# Patient Record
Sex: Female | Born: 1940 | Race: Black or African American | Hispanic: No | Marital: Single | Smoking: Never smoker
Health system: Southern US, Community
[De-identification: ages and names within clinical notes are randomized; demographics above are authoritative.]

## PROBLEM LIST (undated history)

## (undated) DIAGNOSIS — H401130 Primary open-angle glaucoma, bilateral, stage unspecified: Secondary | ICD-10-CM

## (undated) DIAGNOSIS — J45909 Unspecified asthma, uncomplicated: Secondary | ICD-10-CM

## (undated) DIAGNOSIS — I110 Hypertensive heart disease with heart failure: Secondary | ICD-10-CM

## (undated) DIAGNOSIS — I872 Venous insufficiency (chronic) (peripheral): Secondary | ICD-10-CM

## (undated) DIAGNOSIS — E119 Type 2 diabetes mellitus without complications: Secondary | ICD-10-CM

## (undated) DIAGNOSIS — K5792 Diverticulitis of intestine, part unspecified, without perforation or abscess without bleeding: Secondary | ICD-10-CM

## (undated) DIAGNOSIS — M25511 Pain in right shoulder: Secondary | ICD-10-CM

## (undated) DIAGNOSIS — J309 Allergic rhinitis, unspecified: Secondary | ICD-10-CM

## (undated) DIAGNOSIS — E079 Disorder of thyroid, unspecified: Secondary | ICD-10-CM

## (undated) DIAGNOSIS — E559 Vitamin D deficiency, unspecified: Secondary | ICD-10-CM

## (undated) HISTORY — PX: OTHER SURGICAL HISTORY: SHX169

---

## 2007-10-18 ENCOUNTER — Ambulatory Visit: Payer: Self-pay | Admitting: Family Medicine

## 2008-10-23 ENCOUNTER — Ambulatory Visit: Payer: Self-pay | Admitting: Family Medicine

## 2009-01-24 ENCOUNTER — Emergency Department: Payer: Self-pay | Admitting: Emergency Medicine

## 2009-01-26 ENCOUNTER — Inpatient Hospital Stay: Payer: Self-pay | Admitting: Internal Medicine

## 2009-01-30 ENCOUNTER — Emergency Department: Payer: Self-pay

## 2009-06-19 ENCOUNTER — Ambulatory Visit: Payer: Self-pay | Admitting: Gastroenterology

## 2009-07-25 ENCOUNTER — Emergency Department: Payer: Self-pay | Admitting: Emergency Medicine

## 2009-09-20 ENCOUNTER — Ambulatory Visit: Payer: Self-pay | Admitting: Gastroenterology

## 2009-09-20 ENCOUNTER — Emergency Department: Payer: Self-pay | Admitting: Emergency Medicine

## 2009-10-30 ENCOUNTER — Emergency Department: Payer: Self-pay | Admitting: Emergency Medicine

## 2010-01-04 ENCOUNTER — Emergency Department: Payer: Self-pay | Admitting: Emergency Medicine

## 2010-01-14 ENCOUNTER — Inpatient Hospital Stay: Payer: Self-pay | Admitting: Internal Medicine

## 2010-04-09 ENCOUNTER — Emergency Department: Payer: Self-pay | Admitting: Emergency Medicine

## 2011-01-28 ENCOUNTER — Ambulatory Visit: Payer: Self-pay | Admitting: Family Medicine

## 2011-02-06 ENCOUNTER — Ambulatory Visit: Payer: Self-pay

## 2012-03-11 ENCOUNTER — Ambulatory Visit: Payer: Self-pay | Admitting: Family

## 2014-06-12 ENCOUNTER — Ambulatory Visit: Payer: Self-pay | Admitting: Family

## 2015-06-21 ENCOUNTER — Other Ambulatory Visit
Admission: RE | Admit: 2015-06-21 | Discharge: 2015-06-21 | Disposition: A | Discharge status: Home / Self Care | Payer: Medicare (Managed Care) | Source: Ambulatory Visit | Attending: General Surgery | Admitting: General Surgery

## 2015-06-21 ENCOUNTER — Encounter: Payer: Medicare (Managed Care) | Attending: General Surgery | Admitting: General Surgery

## 2015-06-21 ENCOUNTER — Encounter: Payer: Self-pay | Admitting: General Surgery

## 2015-06-21 DIAGNOSIS — L97519 Non-pressure chronic ulcer of other part of right foot with unspecified severity: Secondary | ICD-10-CM | POA: Diagnosis not present

## 2015-06-21 DIAGNOSIS — L97509 Non-pressure chronic ulcer of other part of unspecified foot with unspecified severity: Secondary | ICD-10-CM | POA: Diagnosis not present

## 2015-06-21 DIAGNOSIS — E1140 Type 2 diabetes mellitus with diabetic neuropathy, unspecified: Secondary | ICD-10-CM | POA: Diagnosis not present

## 2015-06-21 DIAGNOSIS — E079 Disorder of thyroid, unspecified: Secondary | ICD-10-CM | POA: Diagnosis not present

## 2015-06-21 DIAGNOSIS — E11621 Type 2 diabetes mellitus with foot ulcer: Secondary | ICD-10-CM | POA: Diagnosis not present

## 2015-06-21 DIAGNOSIS — M1990 Unspecified osteoarthritis, unspecified site: Secondary | ICD-10-CM | POA: Diagnosis not present

## 2015-06-21 DIAGNOSIS — L089 Local infection of the skin and subcutaneous tissue, unspecified: Secondary | ICD-10-CM | POA: Insufficient documentation

## 2015-06-21 DIAGNOSIS — S91101A Unspecified open wound of right great toe without damage to nail, initial encounter: Secondary | ICD-10-CM | POA: Diagnosis present

## 2015-06-21 NOTE — Progress Notes (Signed)
seeiheal 

## 2015-06-25 LAB — WOUND CULTURE: Culture: NORMAL

## 2015-06-25 NOTE — Progress Notes (Addendum)
[name] [name], [name] [name] ([id number]) Visit Report for 06/21/2015 Chief Complaint Document Details Patient Name: [name] [name], [name] [name] 06/21/2015 10:30 Date of Service: AM Medical Record [id number] Number: Patient Account Number: [id number] 11-07-1940 (74 y.o. Treating RN: Huel Coventry Date of Birth/Sex: Female) Other Clinician: Primary Care Physician: Lenoard Aden, Shene Maxfield Referring Physician: Idelle Leech Physician/Extender: [name] in Treatment: 0 Information Obtained from: Patient Electronic Signature(s) Signed: 06/21/2015 5:11:41 PM By: Ardath Sax MD Entered By: Ardath Sax on 06/21/2015 12:34:27 [name] [name], [name] [name] ([id number]) -------------------------------------------------------------------------------- Debridement Details Patient Name: [name], [name] 06/21/2015 10:30 Date of Service: AM Medical Record [id number] Number: Patient Account Number: [id number] [birth year] (74 y.o. Treating RN: Huel Coventry Date of Birth/Sex: Female) Other Clinician: Primary Care Physician: Lenoard Aden, Cylis Ayars Referring Physician: Idelle Leech Physician/Extender: Weeks in Treatment: 0 Debridement Performed for Wound #1 Right,Lateral Toe Great Assessment: Performed By: Physician Ardath Sax, MD Debridement: Debridement Pre-procedure Yes Verification/Time Out Taken: Start Time: 11:32 Pain Control: Other : lidocaine 4% Level: Skin/Subcutaneous Tissue Total Area Debrided (L x 4 (cm) x 3 (cm) = 12 (cm) W): Tissue and other Eschar, Exudate, Fibrin/Slough, Skin, Subcutaneous material debrided: Instrument: Forceps, Scissors Bleeding: Minimum Hemostasis Achieved: Pressure End Time: 11:40 Procedural Pain: 0 Post Procedural Pain: 0 Response to Treatment: Procedure was tolerated well Post Debridement Measurements of Total Wound Length: (cm) 4 Width: (cm) 3 Depth: (cm) 0.3 Volume: (cm) 2.827 Post Procedure Diagnosis Same as Pre-procedure Electronic  Signature(s) Signed: 06/25/2015 9:25:31 AM By: Elliot Gurney, RN, BSN, Kim RN, BSN Entered By: Elliot Gurney, RN, BSN, Kim on 06/21/2015 11:47:53 [name], Female ([id number]) -------------------------------------------------------------------------------- HPI Details Patient Name: [name], [name] 06/21/2015 10:30 Date of Service: AM Medical Record [id number] Number: Patient Account Number: [id number] 08-03-1941 (74 y.o. Treating RN: Huel Coventry Date of Birth/Sex: Female) Other Clinician: Primary Care Physician: Lenoard Aden, Theressa Piedra Referring Physician: Idelle Leech Physician/Extender: [name] in Treatment: 0 Electronic Signature(s) Signed: 06/21/2015 5:11:41 PM By: Ardath Sax MD Entered By: Ardath Sax on 06/21/2015 12:34:37 [name] [name], [name] [name] ([id number]) -------------------------------------------------------------------------------- Physical Exam Details Patient Name: [name], [name] 06/21/2015 10:30 Date of Service: AM Medical Record [id number] Number: Patient Account Number: [id number] 1940-12-15 (74 y.o. Treating RN: Huel Coventry Date of Birth/Sex: Female) Other Clinician: Primary Care Physician: Lenoard Aden, Emilia Kayes Referring Physician: Idelle Leech Physician/Extender: [name] in Treatment: 0 Electronic Signature(s) Signed: 06/21/2015 5:11:41 PM By: Ardath Sax MD Entered By: Ardath Sax on 06/21/2015 12:34:47 [name] [name], [name] [name] ([id number]) -------------------------------------------------------------------------------- Physician Orders Details Patient Name: [name] [name], [name] [name] 06/21/2015 10:30 Date of Service: AM Medical Record [id number] Number: Patient Account Number: [id number] [birth year] (74 y.o. Treating RN: Huel Coventry Date of Birth/Sex: Female) Other Clinician: Primary Care Physician: Lenoard Aden, Tish Begin Referring Physician: Idelle Leech Physician/Extender: [name] in Treatment: 0 Verbal / Phone Orders: Yes Clinician:  Huel Coventry Read Back and Verified: Yes Diagnosis Coding Wound Cleansing Wound #1 Right,Lateral Toe Great o Clean wound with Normal Saline. o No tub bath. Anesthetic Wound #1 Right,Lateral Toe Great o Topical Lidocaine 4% cream applied to wound bed prior to debridement Primary Wound Dressing Wound #1 Right,Lateral Toe Great o Aquacel Ag Secondary Dressing Wound #1 Right,Lateral Toe Great o Gauze and Kerlix/Conform Dressing Change Frequency Wound #1 Right,Lateral Toe Great o Change Dressing Monday, Wednesday, Friday Follow-up Appointments Wound #1 Right,Lateral Toe Great o Return Appointment in 1 week. Additional Orders / Instructions Wound #1 Right,Lateral Toe Great o Increase protein intake. o Activity as tolerated Medications-please add to medication list. Wound #1 Right,Lateral Toe Great o P.O. Antibiotics - continue antibiotics prescribed by outside md [name],  [name] ([id number]) Laboratory o Culture and Sensitivity - Right great toe oooo Electronic Signature(s) Signed: 06/26/2015 9:09:57 AM By: Ardath Sax MD Previous Signature: 06/25/2015 9:25:31 AM Version By: Elliot Gurney RN, BSN, Kim RN, BSN Entered By: Ardath Sax on 06/26/2015 09:09:56 [name], [name] ([id number]) -------------------------------------------------------------------------------- Problem List Details Patient Name: [name] [name], [name] [name] 06/21/2015 10:30 Date of Service: AM Medical Record [id number] Number: Patient Account Number: [id number] 08/30/1941 (74 y.o. Treating RN: Huel Coventry Date of Birth/Sex: Female) Other Clinician: Primary Care Physician: Lattie Corns Referring Physician: Idelle Leech Physician/Extender: Weeks in Treatment: 0 Active Problems ICD-10 Encounter Code Description Active Date Diagnosis E11.621 Type 2 diabetes mellitus with foot ulcer 06/21/2015 Yes Inactive Problems Resolved Problems Electronic Signature(s) Signed: 06/21/2015  5:11:41 PM By: Ardath Sax MD Entered By: Ardath Sax on 06/21/2015 15:09:25 [name], [name] ([id number]) -------------------------------------------------------------------------------- Progress Note Details Patient Name: [name] [name], [name] [name] 06/21/2015 10:30 Date of Service: AM Medical Record [id number] Number: Patient Account Number: [id number] June 01, 1941 (74 y.o. Treating RN: Huel Coventry Date of Birth/Sex: Female) Other Clinician: Primary Care Physician: Lenoard Aden, Nechemia Chiappetta Referring Physician: Idelle Leech Physician/Extender: [name] in Treatment: 0 Subjective Chief Complaint Information obtained from Patient Wound History Patient presents with 1 open wound that has been present for approximately 1 month. Laboratory tests have not been performed in the last month. Patient reportedly has tested positive for an antibiotic resistant organism. Patient reportedly has tested positive for osteomyelitis. Patient History Information obtained from Patient, Chart. Allergies simvastatin (Severity: Moderate), pravastatin (Severity: Moderate) Social History Never smoker, Marital Status - Widowed, Alcohol Use - Never, Drug Use - No History, Caffeine Use - Moderate. Medical History Eyes Patient has history of Cataracts, Glaucoma Cardiovascular Patient has history of Peripheral Venous Disease Endocrine Patient has history of Type II Diabetes Genitourinary Denies history of End Stage Renal Disease Immunological Denies history of Lupus Erythematosus, Raynaud s, Scleroderma Integumentary (Skin) Denies history of History of Burn, History of pressure wounds Musculoskeletal Patient has history of Osteoarthritis, Osteomyelitis Neurologic Patient has history of Neuropathy [name] [name], [name] [name] ([id number]) Oncologic Denies history of Received Chemotherapy, Received Radiation Psychiatric Denies history of Anorexia/bulimia, Confinement Anxiety Patient is treated with Oral Agents.  Blood sugar is tested. Review of Systems (ROS) Constitutional Symptoms (General Health) Complains or has symptoms of Fever - low grade. Eyes The patient has no complaints or symptoms. Ear/Nose/Mouth/Throat The patient has no complaints or symptoms. Hematologic/Lymphatic The patient has no complaints or symptoms. Respiratory The patient has no complaints or symptoms. Cardiovascular Complains or has symptoms of LE edema. Denies complaints or symptoms of Chest pain. Gastrointestinal The patient has no complaints or symptoms. Endocrine Complains or has symptoms of Thyroid disease. Denies complaints or symptoms of Hepatitis, Polydypsia (Excessive Thirst). Genitourinary Complains or has symptoms of Incontinence/dribbling. Denies complaints or symptoms of Kidney failure/ Dialysis. Immunological The patient has no complaints or symptoms. Integumentary (Skin) Complains or has symptoms of Wounds. Denies complaints or symptoms of Bleeding or bruising tendency, Breakdown, Swelling. Musculoskeletal The patient has no complaints or symptoms. Neurologic The patient has no complaints or symptoms. Oncologic The patient has no complaints or symptoms. Psychiatric The patient has no complaints or symptoms. General Notes: Patient is not best historian. A lot of information taken from [facility] Notes. Objective [name] [name], Joslynn ([id number]) Constitutional Vitals Time Taken: 10:04 AM, Height: 66 in, Weight: 195 lbs, BMI: 31.5, Temperature: 98.2 F, Pulse: 77 bpm, Respiratory Rate: 18 breaths/min, Blood Pressure: 127/45 mmHg. General Notes: Patient states she is feeling GOOD! Integumentary (Hair, Skin) Wound #1 status is  Open. Original cause of wound was Gradually Appeared. The wound is located on the [employer]. The wound measures 4cm length x 3cm width x 0.3cm depth; 9.425cm^2 area and 2.827cm^3 volume. The wound margin is indistinct and nonvisible. There is a large (67-100%)  amount of necrotic tissue within the wound bed including Adherent Slough. Assessment Procedures Wound #1 Wound #1 is a Diabetic Wound/Ulcer of the Lower Extremity located on the Right,Lateral Toe Great . There was a Skin/Subcutaneous Tissue Debridement ([id number]) debridement with total area of 12 sq cm performed by Ardath Sax, MD. with the following instrument(s): Forceps and Scissors including Exudate, Fibrin/Slough, Eschar, Skin, and Subcutaneous after achieving pain control using Other (lidocaine 4%). A time out was conducted prior to the start of the procedure. A Minimum amount of bleeding was controlled with Pressure. The procedure was tolerated well with a pain level of 0 throughout and a pain level of 0 following the procedure. Post Debridement Measurements: 4cm length x 3cm width x 0.3cm depth; 2.827cm^3 volume. Post procedure Diagnosis Wound #1: Same as Pre-Procedure Plan Wound Cleansing: Wound #1 Right,Lateral Toe Great: Clean wound with Normal Saline. No tub bath. [name] [name], [name] [name] ([id number]) Anesthetic: Wound #1 Right,Lateral Toe Great: Topical Lidocaine 4% cream applied to wound bed prior to debridement Primary Wound Dressing: Wound #1 Right,Lateral Toe Great: Aquacel Ag Secondary Dressing: Wound #1 Right,Lateral Toe Great: Gauze and Kerlix/Conform Dressing Change Frequency: Wound #1 Right,Lateral Toe Great: Change Dressing Monday, Wednesday, Friday Follow-up Appointments: Wound #1 Right,Lateral Toe Great: Return Appointment in 1 week. Additional Orders / Instructions: Wound #1 Right,Lateral Toe Great: Increase protein intake. Activity as tolerated Medications-please add to medication list.: Wound #1 Right,Lateral Toe Great: P.O. Antibiotics - continue antibiotics prescribed by outside md Laboratory ordered were: Culture and Sensitivity - Right great toe Follow-Up Appointments: A follow-up appointment should be scheduled. Medication Reconciliation  completed and provided to Patient/Care Provider. debrided necrotic skin from right great toe. Treat with silver alginate Electronic Signature(s) Signed: 06/21/2015 5:11:41 PM By: Ardath Sax MD Entered By: Ardath Sax on 06/21/2015 12:37:23 [name] [name], [name] [name] ([id number]) -------------------------------------------------------------------------------- ROS/PFSH Details Patient Name: [name] [name], [name] [name] 06/21/2015 10:30 Date of Service: AM Medical Record [id number] Number: Patient Account Number: [id number] [birth year] (74 y.o. Treating RN: Huel Coventry Date of Birth/Sex: Female) Other Clinician: Primary Care Physician: Lenoard Aden, Naturi Alarid Referring Physician: Idelle Leech Physician/Extender: Weeks in Treatment: 0 Information Obtained From Patient Chart Wound History Do you currently have one or more open woundso Yes How many open wounds do you currently haveo 1 Approximately how long have you had your woundso 1 month Has your wound(s) ever healed and then re-openedo No Have you had any lab work done in the past montho No Have you tested positive for osteomyelitis (bone infection)o Yes Constitutional Symptoms (General Health) Complaints and Symptoms: Positive for: Fever - low grade Cardiovascular Complaints and Symptoms: Positive for: LE edema Negative for: Chest pain Medical History: Positive for: Peripheral Venous Disease Endocrine Complaints and Symptoms: Positive for: Thyroid disease Negative for: Hepatitis; Polydypsia (Excessive Thirst) Medical History: Positive for: Type II Diabetes Time with diabetes: 20 years Treated with: Oral agents Blood sugar tested every day: Yes Tested : 3 times Genitourinary Complaints and Symptoms: Positive for: Incontinence/dribbling Hindley, Reigna ([id number]) Negative for: Kidney failure/ Dialysis Medical History: Negative for: End Stage Renal Disease Integumentary (Skin) Complaints and Symptoms: Positive for:  Wounds Negative for: Bleeding or bruising tendency; Breakdown; Swelling Medical History: Negative for: History of Burn; History of pressure wounds Eyes Complaints and Symptoms:  No Complaints or Symptoms Medical History: Positive for: Cataracts; Glaucoma Ear/Nose/Mouth/Throat Complaints and Symptoms: No Complaints or Symptoms Hematologic/Lymphatic Complaints and Symptoms: No Complaints or Symptoms Respiratory Complaints and Symptoms: No Complaints or Symptoms Gastrointestinal Complaints and Symptoms: No Complaints or Symptoms Immunological Complaints and Symptoms: No Complaints or Symptoms Medical History: Negative for: Lupus Erythematosus; Raynaudos; Scleroderma Musculoskeletal Ribaudo, Ivannah ([id number]) Complaints and Symptoms: No Complaints or Symptoms Medical History: Positive for: Osteoarthritis; Osteomyelitis Neurologic Complaints and Symptoms: No Complaints or Symptoms Medical History: Positive for: Neuropathy Oncologic Complaints and Symptoms: No Complaints or Symptoms Medical History: Negative for: Received Chemotherapy; Received Radiation Psychiatric Complaints and Symptoms: No Complaints or Symptoms Medical History: Negative for: Anorexia/bulimia; Confinement Anxiety HBO Extended History Items Eyes: Eyes: Cataracts Glaucoma Immunizations Tetanus Vaccine: Last tetanus shot: 11/14/2010 Family and Social History Never smoker; Marital Status - Widowed; Alcohol Use: Never; Drug Use: No History; Caffeine Use: Moderate Notes Patient is not best historian. A lot of information taken from [facility] Notes. Electronic Signature(s) Signed: 06/25/2015 9:25:31 AM By: Elliot Gurney, RN, BSN, Kim RN, BSN Entered By: Elliot Gurney, RN, BSN, Kim on 06/21/2015 11:25:21 MARDELLA, NUCKLES ([id number]) -------------------------------------------------------------------------------- SuperBill Details Patient Name: [name] Date of Service: 06/21/2015 Medical Record Number:  [id number] Patient Account Number: [id number] Date of Birth/Sex: 1941-02-11 (74 y.o. 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 with foot ulcer Facility Procedures CPT4 Code: 08657846 Description: 99213 - WOUND CARE VISIT-LEV 3 EST PT Modifier: Quantity: 1 CPT4 Code: [id number] Description: 11042 - DEB SUBQ TISSUE 20 SQ CM/< ICD-10 Description Diagnosis E11.621 Type 2 diabetes mellitus with foot ulcer Modifier: Quantity: 1 Physician Procedures CPT4 Code: [id number] Description: 99213 - WC PHYS LEVEL 3 - EST PT ICD-10 Description Diagnosis E11.621 Type 2 diabetes mellitus with foot ulcer Modifier: Quantity: 1 CPT4 Code: [id number] Description: 11042 - WC PHYS SUBQ TISS 20 SQ CM ICD-10 Description Diagnosis E11.621 Type 2 diabetes mellitus with foot ulcer Modifier: Quantity: 1 Electronic Signature(s) Signed: 06/21/2015 5:11:41 PM By: Ardath Sax MD Entered By: Ardath Sax on 06/21/2015 15:10:16

## 2015-06-25 NOTE — Progress Notes (Signed)
[name] [name], [name] [name] ([id number]) Visit Report for 06/21/2015 Allergy List Details Patient Name: [name] [name], [name] [name] Date of Service: 06/21/2015 10:30 AM Medical Record Number: [id number] Patient Account Number: [id number] Date of Birth/Sex: Nov 26, 1940 (74 y.o. Female) Treating RN: Huel Coventry Primary Care Physician: Idelle Leech Other Clinician: Referring Physician: Idelle Leech Treating Physician/Extender: Ardath Sax Weeks in Treatment: 0 Allergies Active Allergies simvastatin Severity: Moderate pravastatin Severity: Moderate Allergy Notes Electronic Signature(s) Signed: 06/25/2015 9:25:31 AM By: Elliot Gurney, RN, BSN, Kim RN, BSN Entered By: Elliot Gurney, RN, BSN, Kim on 06/21/2015 10:35:05 [name] [name] ([id number]) -------------------------------------------------------------------------------- Arrival Information Details Patient Name: [name] [name] Date of Service: 06/21/2015 10:30 AM Medical Record Number: [id number] Patient Account Number: [id number] Date of Birth/Sex: 1941-07-24 (74 y.o. Female) Treating RN: Huel Coventry Primary Care Physician: Idelle Leech Other Clinician: Referring Physician: Idelle Leech Treating Physician/Extender: Elayne Snare in Treatment: 0 Visit Information Patient Arrived: Cloyde Reams Time: 11:03 Accompanied By: self Transfer Assistance: None Patient Identification Verified: Yes Secondary Verification Process Yes Completed: Patient Has Alerts: Yes Patient Alerts: Patient on Blood Thinner Type II Diabetic 81mg  Aspirin Electronic Signature(s) Signed: 06/21/2015 5:11:41 PM By: Ardath Sax MD Entered By: Ardath Sax on 06/21/2015 12:33:09 [name] [name] ([id number]) -------------------------------------------------------------------------------- Clinic Level of Care Assessment Details Patient Name: [name] [name] Date of Service: 06/21/2015 10:30 AM Medical Record Number: [id number] Patient Account Number: [id number] Date of Birth/Sex:  Mar 21, 1941 (74 y.o. Female) Treating RN: Huel Coventry Primary Care Physician: Idelle Leech Other Clinician: Referring Physician: Idelle Leech Treating Physician/Extender: Elayne Snare in Treatment: 0 Clinic Level of Care Assessment Items TOOL 1 Quantity Score []  - Use when EandM and Procedure is performed on INITIAL visit 0 ASSESSMENTS - Nursing Assessment / Reassessment X - General Physical Exam (combine w/ comprehensive assessment (listed just 1 20 below) when performed on new pt. evals) X - Comprehensive Assessment (HX, ROS, Risk Assessments, Wounds Hx, etc.) 1 25 ASSESSMENTS - Wound and Skin Assessment / Reassessment []  - Dermatologic / Skin Assessment (not related to wound area) 0 ASSESSMENTS - Ostomy and/or Continence Assessment and Care []  - Incontinence Assessment and Management 0 []  - Ostomy Care Assessment and Management (repouching, etc.) 0 PROCESS - Coordination of Care X - Simple Patient / Family Education for ongoing care 1 15 []  - Complex (extensive) Patient / Family Education for ongoing care 0 X - Staff obtains Chiropractor, Records, Test Results / Process Orders 1 10 []  - Staff telephones HHA, Nursing Homes / Clarify orders / etc 0 []  - Routine Transfer to another Facility (non-emergent condition) 0 []  - Routine Hospital Admission (non-emergent condition) 0 X - New Admissions / Manufacturing engineer / Ordering NPWT, Apligraf, etc. 1 15 []  - Emergency Hospital Admission (emergent condition) 0 PROCESS - Special Needs []  - Pediatric / Minor Patient Management 0 []  - Isolation Patient Management 0 [name] [name], [name] [name] ([id number]) []  - Hearing / Language / Visual special needs 0 []  - Assessment of Community assistance (transportation, D/C planning, etc.) 0 []  - Additional assistance / Altered mentation 0 []  - Support Surface(s) Assessment (bed, cushion, seat, etc.) 0 INTERVENTIONS - Miscellaneous []  - External ear exam 0 []  - Patient Transfer (multiple staff /  Nurse, adult / Similar devices) 0 []  - Simple Staple / Suture removal (25 or less) 0 []  - Complex Staple / Suture removal (26 or more) 0 []  - Hypo/Hyperglycemic Management (do not check if billed separately) 0 X - Ankle / Brachial Index (ABI) - do not check if billed separately 1 15 Has the patient been seen at  the hospital within the last three years: Yes Total Score: 100 Level Of Care: New/Established - Level 3 Electronic Signature(s) Signed: 06/25/2015 9:25:31 AM By: Elliot Gurney, RN, BSN, Kim RN, BSN Entered By: Elliot Gurney, RN, BSN, Kim on 06/21/2015 11:51:29 [name] [name], [name] [name] ([id number]) -------------------------------------------------------------------------------- Encounter Discharge Information Details Patient Name: [name] [name] Date of Service: 06/21/2015 10:30 AM Medical Record Number: [id number] Patient Account Number: [id number] Date of Birth/Sex: [birth year] (74 y.o. Female) Treating RN: Huel Coventry Primary Care Physician: Idelle Leech Other Clinician: Referring Physician: Idelle Leech Treating Physician/Extender: Elayne Snare in Treatment: 0 Encounter Discharge Information Items Schedule Follow-up Appointment: No Medication Reconciliation completed No and provided to Patient/Care Rolan Wrightsman: Provided on Clinical Summary of Care: 06/21/2015 Form Type Recipient Paper Patient ML Electronic Signature(s) Signed: 06/21/2015 5:11:41 PM By: Ardath Sax MD Previous Signature: 06/21/2015 11:52:39 AM Version By: Gwenlyn Perking Entered By: Ardath Sax on 06/21/2015 15:10:55 [name] [name], [name] [name] ([id number]) -------------------------------------------------------------------------------- Lower Extremity Assessment Details Patient Name: [name] [name] Date of Service: 06/21/2015 10:30 AM Medical Record Number: [id number] Patient Account Number: [id number] Date of Birth/Sex: 1940/11/11 (74 y.o. Female) Treating RN: Huel Coventry Primary Care Physician: Idelle Leech Other  Clinician: Referring Physician: Idelle Leech Treating Physician/Extender: Elayne Snare in Treatment: 0 Vascular Assessment Pulses: Posterior Tibial Palpable: [Right:No] Doppler: [Right:Monophasic] Dorsalis Pedis Palpable: [Right:Yes] Doppler: [Right:Monophasic] Extremity colors, hair growth, and conditions: Extremity Color: [Right:Hyperpigmented] Hair Growth on Extremity: [Right:No] Temperature of Extremity: [Right:Warm] Capillary Refill: [Right:> 3 seconds] Blood Pressure: Brachial: [Right:126] Dorsalis Pedis: [Left:Dorsalis Pedis: 102] Ankle: Posterior Tibial: [Left:Posterior Tibial:] [Right:0.81] Toe Nail Assessment Left: Right: Thick: No Discolored: Yes Deformed: No Improper Length and Hygiene: No Electronic Signature(s) Signed: 06/25/2015 9:25:31 AM By: Elliot Gurney, RN, BSN, Kim RN, BSN Entered By: Elliot Gurney, RN, BSN, Kim on 06/21/2015 11:23:07 [name] [name] ([id number]) -------------------------------------------------------------------------------- Multi Wound Chart Details Patient Name: [name] [name] Date of Service: 06/21/2015 10:30 AM Medical Record Number: [id number] Patient Account Number: [id number] Date of Birth/Sex: [birth year] (74 y.o. Female) Treating RN: Huel Coventry Primary Care Physician: Idelle Leech Other Clinician: Referring Physician: Idelle Leech Treating Physician/Extender: Elayne Snare in Treatment: 0 Vital Signs Height(in): 66 Pulse(bpm): 77 Weight(lbs): 195 Blood Pressure 127/45 (mmHg): Body Mass Index(BMI): 31 Temperature(F): 98.2 Respiratory Rate 18 (breaths/min): Photos: [1:No Photos] [N/A:N/A] Wound Location: [1:Right Toe Great - Lateral N/A] Wounding Event: [1:Gradually Appeared] [N/A:N/A] Primary Etiology: [1:Diabetic Wound/Ulcer of N/A the Lower Extremity] Comorbid History: [1:Cataracts, Glaucoma, Peripheral Venous Disease, Type II Diabetes, Osteoarthritis, Osteomyelitis, Neuropathy] [N/A:N/A] Date Acquired:  [1:05/28/2015] [N/A:N/A] Weeks of Treatment: [1:0] [N/A:N/A] Wound Status: [1:Open] [N/A:N/A] Measurements L x W x D 4x3x0.3 [N/A:N/A] (cm) Area (cm) : [1:9.425] [N/A:N/A] Volume (cm) : [1:2.827] [N/A:N/A] % Reduction in Area: [1:0.00%] [N/A:N/A] % Reduction in Volume: 0.00% [N/A:N/A] Classification: [1:Unable to visualize wound N/A bed] Wound Margin: [1:Indistinct, nonvisible] [N/A:N/A] Necrotic Amount: [1:N/A] [N/A:N/A] Debridement: [1:Debridement ([id number])] [N/A:N/A] Time-Out Taken: [1:Yes] [N/A:N/A] Pain Control: [1:Other] [N/A:N/A] Tissue Debrided: [1:Necrotic/Eschar, Fibrin/Slough, Exudates, Skin, Subcutaneous] [N/A:N/A] Level: Skin/Subcutaneous N/A N/A Tissue Debridement Area (sq 12 N/A N/A cm): Instrument: Forceps, Scissors N/A N/A Bleeding: Minimum N/A N/A Hemostasis Achieved: Pressure N/A N/A Procedural Pain: 0 N/A N/A Post Procedural Pain: 0 N/A N/A Debridement Treatment Procedure was tolerated N/A N/A Response: well Post Debridement 4x3x0.3 N/A N/A Measurements L x W x D (cm) Post Debridement 2.827 N/A N/A Volume: (cm) Periwound Skin Texture: No Abnormalities Noted N/A N/A Periwound Skin No Abnormalities Noted N/A N/A Moisture: Periwound Skin Color: No Abnormalities Noted N/A N/A Tenderness on No N/A N/A Palpation: Wound Preparation: Ulcer Cleansing: N/A  N/A Rinsed/Irrigated with Saline Topical Anesthetic Applied: Other: lidocaine 4% Procedures Performed: Debridement N/A N/A Treatment Notes Electronic Signature(s) Signed: 06/25/2015 9:25:31 AM By: Elliot Gurney, RN, BSN, Kim RN, BSN Entered By: Elliot Gurney, RN, BSN, Kim on 06/21/2015 11:48:49 [name] [name], [name] [name] ([id number]) -------------------------------------------------------------------------------- Multi-Disciplinary Care Plan Details Patient Name: [name] [name] Date of Service: 06/21/2015 10:30 AM Medical Record Number: [id number] Patient Account Number: [id number] Date of Birth/Sex: March 22, 1941 (74 y.o.  Female) Treating RN: Huel Coventry Primary Care Physician: Idelle Leech Other Clinician: Referring Physician: Idelle Leech Treating Physician/Extender: Elayne Snare in Treatment: 0 Active Inactive Abuse / Safety / Falls / Self Care Management Nursing Diagnoses: Impaired physical mobility Potential for falls Goals: Patient will remain injury free Date Initiated: 06/21/2015 Goal Status: Active Interventions: Assess fall risk on admission and as needed Notes: Nutrition Nursing Diagnoses: Impaired glucose control: actual or potential Goals: Patient/caregiver verbalizes understanding of need to maintain therapeutic glucose control per primary care physician Date Initiated: 06/21/2015 Goal Status: Active Interventions: Provide education on elevated blood sugars and impact on wound healing Notes: Orientation to the Wound Care Program Nursing Diagnoses: Knowledge deficit related to the wound healing center program GoalsDIANAH, [name] [name] ([id number]) Patient/caregiver will verbalize understanding of the Wound Healing Center Program Date Initiated: 06/21/2015 Goal Status: Active Interventions: Provide education on orientation to the wound center Notes: Soft Tissue Infection Nursing Diagnoses: Impaired tissue integrity Potential for infection: soft tissue Goals: Patient's soft tissue infection will resolve Date Initiated: 06/21/2015 Goal Status: Active Interventions: Assess signs and symptoms of infection every visit Treatment Activities: Culture and sensitivity : 06/21/2015 Systemic antibiotics : 06/21/2015 Notes: Wound/Skin Impairment Nursing Diagnoses: Impaired tissue integrity Goals: Ulcer/skin breakdown will heal within 14 weeks Date Initiated: 06/21/2015 Goal Status: Active Interventions: Assess ulceration(s) every visit Treatment Activities: Topical wound management initiated : 06/21/2015 Notes: [name] [name], [name] [name] ([id number]) Electronic  Signature(s) Signed: 06/25/2015 9:25:31 AM By: Elliot Gurney, RN, BSN, Kim RN, BSN Entered By: Elliot Gurney, RN, BSN, Kim on 06/21/2015 12:11:22 [name] [name] ([id number]) -------------------------------------------------------------------------------- Pain Assessment Details Patient Name: [name] [name] Date of Service: 06/21/2015 10:30 AM Medical Record Number: [id number] Patient Account Number: [id number] Date of Birth/Sex: 1940/12/20 (74 y.o. Female) Treating RN: Huel Coventry Primary Care Physician: Idelle Leech Other Clinician: Referring Physician: Idelle Leech Treating Physician/Extender: Elayne Snare in Treatment: 0 Active Problems Location of Pain Severity and Description of Pain Patient Has Paino No Site Locations Pain Management and Medication Current Pain Management: Electronic Signature(s) Signed: 06/21/2015 5:11:41 PM By: Ardath Sax MD Signed: 06/25/2015 9:25:31 AM By: Elliot Gurney RN, BSN, Kim RN, BSN Entered By: Ardath Sax on 06/21/2015 12:33:31 EMALYN, [name] [name] ([id number]) -------------------------------------------------------------------------------- Patient/Caregiver Education Details Patient Name: [name] [name] Date of Service: 06/21/2015 10:30 AM Medical Record Number: [id number] Patient Account Number: [id number] Date of Birth/Gender: 26-Sep-1940 (74 y.o. Female) Treating RN: Huel Coventry Primary Care Physician: Idelle Leech Other Clinician: Referring Physician: Idelle Leech Treating Physician/Extender: Elayne Snare in Treatment: 0 Education Assessment Education Provided To: Patient and Caregiver Education Topics Provided Wound/Skin Impairment: Handouts: Caring for Your Ulcer, Other: wound care as prescribed Methods: Demonstration, Explain/Verbal Responses: State content correctly Electronic Signature(s) Signed: 06/25/2015 9:25:31 AM By: Elliot Gurney, RN, BSN, Kim RN, BSN Entered By: Elliot Gurney, RN, BSN, Kim on 06/21/2015 11:53:04 [name] [name], [name] [name]  ([id number]) -------------------------------------------------------------------------------- Wound Assessment Details Patient Name: [name] [name] Date of Service: 06/21/2015 10:30 AM Medical Record Number: [id number] Patient Account Number: [id number] Date of Birth/Sex: November 06, 1940 (74 y.o. Female) Treating RN: Huel Coventry Primary Care Physician: Idelle Leech Other Clinician: Referring Physician: Idelle Leech Treating Physician/Extender: Ardath Sax  Weeks in Treatment: 0 Wound Status Wound Number: 1 Primary Diabetic Wound/Ulcer of the Lower Etiology: Extremity Wound Location: Right Toe Great - Lateral Wound Open Wounding Event: Gradually Appeared Status: Date Acquired: 05/28/2015 Comorbid Cataracts, Glaucoma, Peripheral Weeks Of Treatment: 0 History: Venous Disease, Type II Diabetes, Clustered Wound: No Osteoarthritis, Osteomyelitis, Neuropathy Photos Photo Uploaded By: Elliot Gurney, RN, BSN, Kim on 06/21/2015 12:27:20 Wound Measurements Length: (cm) 4 Width: (cm) 3 Depth: (cm) 0.3 Area: (cm) 9.425 Volume: (cm) 2.827 % Reduction in Area: 0% % Reduction in Volume: 0% Wound Description Classification: Unable to visualize wound bed Wound Margin: Indistinct, nonvisible Wound Bed Necrotic Amount: Large (67-100%) Necrotic Quality: Adherent Slough Periwound Skin Texture Texture Color No Abnormalities Noted: No No Abnormalities Noted: No [name] [name], [name] [name] ([id number]) Moisture No Abnormalities Noted: No Wound Preparation Ulcer Cleansing: Rinsed/Irrigated with Saline Topical Anesthetic Applied: Other: lidocaine 4%, Treatment Notes Wound #1 (Right, Lateral Toe Great) 1. Cleansed with: Clean wound with Normal Saline 2. Anesthetic Topical Lidocaine 4% cream to wound bed prior to debridement 4. Dressing Applied: Aquacel Ag 5. Secondary Dressing Applied Gauze and Kerlix/Conform 7. Secured with Secretary/administrator) Signed: 06/25/2015 9:25:31 AM By: Elliot Gurney, RN,  BSN, Kim RN, BSN Entered By: Elliot Gurney, RN, BSN, Kim on 06/21/2015 11:29:10 MIRELLA, GUEYE ([id number]) -------------------------------------------------------------------------------- Vitals Details Patient Name: [name] [name] Date of Service: 06/21/2015 10:30 AM Medical Record Number: [id number] Patient Account Number: [id number] Date of Birth/Sex: 1940-11-25 (74 y.o. Female) Treating RN: Huel Coventry Primary Care Physician: Idelle Leech Other Clinician: Referring Physician: Idelle Leech Treating Physician/Extender: Elayne Snare in Treatment: 0 Vital Signs Time Taken: 10:04 Temperature (F): 98.2 Height (in): 66 Pulse (bpm): 77 Weight (lbs): 195 Respiratory Rate (breaths/min): 18 Body Mass Index (BMI): 31.5 Blood Pressure (mmHg): 127/45 Reference Range: 80 - 120 mg / dl Notes Patient states she is feeling GOOD! Electronic Signature(s) Signed: 06/21/2015 5:11:41 PM By: Ardath Sax MD Entered By: Ardath Sax on 06/21/2015 12:33:44

## 2015-06-25 NOTE — Progress Notes (Signed)
[name], [name] ([id number]) Visit Report for 06/21/2015 Abuse/Suicide Risk Screen Details Patient Name: [name] [name], [name] [name] 06/21/2015 10:30 Date of Service: AM Medical Record [id number] Number: Patient Account Number: [id number] May 05, 1941 (74 y.o. Treating RN: Huel Coventry Date of Birth/Sex: Female) Other Clinician: Primary Care Physician: Lenoard Aden, PETER Referring Physician: Idelle Leech Physician/Extender: Weeks in Treatment: 0 Abuse/Suicide Risk Screen Items Answer ABUSE/SUICIDE RISK SCREEN: Has anyone close to you tried to hurt or harm you recentlyo No Do you feel uncomfortable with anyone in your familyo No Has anyone forced you do things that you didnot want to doo No Do you have any thoughts of harming yourselfo No Patient displays signs or symptoms of abuse and/or neglect. No Electronic Signature(s) Signed: 06/25/2015 9:25:31 AM By: Elliot Gurney, RN, BSN, Kim RN, BSN Entered By: Elliot Gurney, RN, BSN, Kim on 06/21/2015 11:17:06 Martire, Anna-Marie ([id number]) -------------------------------------------------------------------------------- Activities of Daily Living Details Patient Name: [name], [name] 06/21/2015 10:30 Date of Service: AM Medical Record [id number] Number: Patient Account Number: [id number] [birth year] (74 y.o. Treating RN: Huel Coventry Date of Birth/Sex: Female) Other Clinician: Primary Care Physician: Lenoard Aden, PETER Referring Physician: Idelle Leech Physician/Extender: Weeks in Treatment: 0 Activities of Daily Living Items Answer Activities of Daily Living (Please select one for each item) Drive Automobile Not Able Take Medications Need Assistance Use Telephone Need Assistance Care for Appearance Need Assistance Use Toilet Need Assistance Bath / Shower Need Assistance Dress Self Need Assistance Feed Self Need Assistance Walk Need Assistance Get In / Out Bed Need Assistance Housework Need Assistance Prepare Meals  Need Assistance Handle Money Need Assistance Shop for Self Need Assistance Electronic Signature(s) Signed: 06/25/2015 9:25:31 AM By: Elliot Gurney, RN, BSN, Kim RN, BSN Entered By: Elliot Gurney, RN, BSN, Kim on 06/21/2015 11:17:27 Miedema, Catheleen ([id number]) -------------------------------------------------------------------------------- Education Assessment Details Patient Name: [name], [name] 06/21/2015 10:30 Date of Service: AM Medical Record [id number] Number: Patient Account Number: [id number] 1941/07/12 (74 y.o. Treating RN: Huel Coventry Date of Birth/Sex: Female) Other Clinician: Primary Care Physician: Lenoard Aden, PETER Referring Physician: Idelle Leech Physician/Extender: [name] in Treatment: 0 Learning Preferences/Education Level/Primary Language Learning Preference: Explanation, Demonstration, Printed Material Preferred Language: English Cognitive Barrier Assessment/Beliefs Language Barrier: No Translator Needed: No Memory Deficit: No Emotional Barrier: No Cultural/Religious Beliefs Affecting Medical No Care: Physical Barrier Assessment Impaired Vision: Yes Glasses Knowledge/Comprehension Assessment Knowledge Level: Medium Comprehension Level: Medium Ability to understand written Medium instructions: Ability to understand verbal Medium instructions: Motivation Assessment Anxiety Level: Calm Cooperation: Cooperative Education Importance: Acknowledges Need Interest in Health Problems: Uninterested Perception: Coherent Willingness to Engage in Self- High Management Activities: Readiness to Engage in Self- High Management Activities: Electronic Signature(s) Signed: 06/25/2015 9:25:31 AM By: Elliot Gurney, RN, BSN, Kim RN, BSN Simpson, Radley ([id number]) Entered By: Elliot Gurney, RN, BSN, Kim on 06/21/2015 11:17:56 Dohn, Denna ([id number]) -------------------------------------------------------------------------------- Fall Risk Assessment Details Patient  Name: [name], [name] 06/21/2015 10:30 Date of Service: AM Medical Record [id number] Number: Patient Account Number: [id number] 1941-06-21 (74 y.o. Treating RN: Huel Coventry Date of Birth/Sex: Female) Other Clinician: Primary Care Physician: Lenoard Aden, PETER Referring Physician: Idelle Leech Physician/Extender: Weeks in Treatment: 0 Fall Risk Assessment Items FALL RISK ASSESSMENT: History of falling - immediate or within 3 months 0 No Secondary diagnosis 0 No Ambulatory aid None/bed rest/wheelchair/nurse 0 Yes Crutches/cane/walker 15 Yes Furniture 0 No IV Access/Saline Lock 0 No Gait/Training Normal/bed rest/immobile 0 No Weak 10 Yes Impaired 0 No Mental Status Oriented to own ability 0 No Electronic Signature(s) Signed: 06/25/2015 9:25:31 AM By: Elliot Gurney, RN, BSN,  Selena Batten RN, BSN Entered By: Elliot Gurney, RN, BSN, Kim on 06/21/2015 11:18:15 Piggee, Wyvonne ([id number]) -------------------------------------------------------------------------------- Foot Assessment Details Patient Name: [name], [name] 06/21/2015 10:30 Date of Service: AM Medical Record [id number] Number: Patient Account Number: [id number] [birth year] (74 y.o. Treating RN: Huel Coventry Date of Birth/Sex: Female) Other Clinician: Primary Care Physician: Lenoard Aden, PETER Referring Physician: Idelle Leech Physician/Extender: Weeks in Treatment: 0 Foot Assessment Items Site Locations + = Sensation present, - = Sensation absent, C = Callus, U = Ulcer R = Redness, W = Warmth, M = Maceration, PU = Pre-ulcerative lesion F = Fissure, S = Swelling, D = Dryness Assessment Right: Left: Other Deformity: No No Prior Foot Ulcer: No No Prior Amputation: No No Charcot Joint: No No Ambulatory Status: Ambulatory With Help Assistance Device: Walker GaitFutures trader) Signed: 06/25/2015 9:25:31 AM By: Elliot Gurney, RN, BSN, Kim RN, BSN Entered By: Elliot Gurney, RN, BSN, Kim on  06/21/2015 11:18:55 Beggs, Corda ([id number]) TALECIA, SHERLIN ([id number]) -------------------------------------------------------------------------------- Nutrition Risk Assessment Details Patient Name: [name], [name] 06/21/2015 10:30 Date of Service: AM Medical Record [id number] Number: Patient Account Number: [id number] 27-Sep-1940 (74 y.o. Treating RN: Huel Coventry Date of Birth/Sex: Female) Other Clinician: Primary Care Physician: Lenoard Aden, PETER Referring Physician: Idelle Leech Physician/Extender: Weeks in Treatment: 0 Height (in): 66 Weight (lbs): 195 Body Mass Index (BMI): 31.5 Nutrition Risk Assessment Items NUTRITION RISK SCREEN: I have an illness or condition that made me change the kind and/or 0 No amount of food I eat I eat fewer than two meals per day 0 No I eat few fruits and vegetables, or milk products 0 No I have three or more drinks of beer, liquor or wine almost every day 0 No I have tooth or mouth problems that make it hard for me to eat 0 No I don't always have enough money to buy the food I need 0 No I eat alone most of the time 0 No I take three or more different prescribed or over-the-counter drugs a 0 No day Without wanting to, I have lost or gained 10 pounds in the last six 0 No months I am not always physically able to shop, cook and/or feed myself 0 No Nutrition Protocols Good Risk Protocol Provide education on elevated blood sugars Moderate Risk Protocol 0 and impact on wound healing, as applicable Electronic Signature(s) Signed: 06/25/2015 9:25:31 AM By: Elliot Gurney, RN, BSN, Kim RN, BSN Entered By: Elliot Gurney, RN, BSN, Kim on 06/21/2015 11:18:26

## 2015-06-26 ENCOUNTER — Encounter: Payer: Self-pay | Admitting: Emergency Medicine

## 2015-06-26 ENCOUNTER — Emergency Department: Payer: Medicare (Managed Care)

## 2015-06-26 ENCOUNTER — Emergency Department
Admission: EM | Admit: 2015-06-26 | Discharge: 2015-06-26 | Disposition: A | Discharge status: Home / Self Care | Payer: Medicare (Managed Care) | Attending: Emergency Medicine | Admitting: Emergency Medicine

## 2015-06-26 DIAGNOSIS — S060X0A Concussion without loss of consciousness, initial encounter: Secondary | ICD-10-CM | POA: Insufficient documentation

## 2015-06-26 DIAGNOSIS — W01198A Fall on same level from slipping, tripping and stumbling with subsequent striking against other object, initial encounter: Secondary | ICD-10-CM | POA: Diagnosis not present

## 2015-06-26 DIAGNOSIS — I509 Heart failure, unspecified: Secondary | ICD-10-CM | POA: Insufficient documentation

## 2015-06-26 DIAGNOSIS — I110 Hypertensive heart disease with heart failure: Secondary | ICD-10-CM | POA: Diagnosis not present

## 2015-06-26 DIAGNOSIS — Y92129 Unspecified place in nursing home as the place of occurrence of the external cause: Secondary | ICD-10-CM | POA: Diagnosis not present

## 2015-06-26 DIAGNOSIS — E119 Type 2 diabetes mellitus without complications: Secondary | ICD-10-CM | POA: Diagnosis not present

## 2015-06-26 DIAGNOSIS — Y9389 Activity, other specified: Secondary | ICD-10-CM | POA: Insufficient documentation

## 2015-06-26 DIAGNOSIS — Y998 Other external cause status: Secondary | ICD-10-CM | POA: Diagnosis not present

## 2015-06-26 DIAGNOSIS — S0083XA Contusion of other part of head, initial encounter: Secondary | ICD-10-CM | POA: Diagnosis not present

## 2015-06-26 DIAGNOSIS — S0990XA Unspecified injury of head, initial encounter: Secondary | ICD-10-CM

## 2015-06-26 HISTORY — DX: Hypertensive heart disease with heart failure: I11.0

## 2015-06-26 HISTORY — DX: Pain in right shoulder: M25.511

## 2015-06-26 HISTORY — DX: Disorder of thyroid, unspecified: E07.9

## 2015-06-26 HISTORY — DX: Primary open-angle glaucoma, bilateral, stage unspecified: H40.1130

## 2015-06-26 HISTORY — DX: Allergic rhinitis, unspecified: J30.9

## 2015-06-26 HISTORY — DX: Diverticulitis of intestine, part unspecified, without perforation or abscess without bleeding: K57.92

## 2015-06-26 HISTORY — DX: Venous insufficiency (chronic) (peripheral): I87.2

## 2015-06-26 HISTORY — DX: Unspecified asthma, uncomplicated: J45.909

## 2015-06-26 HISTORY — DX: Type 2 diabetes mellitus without complications: E11.9

## 2015-06-26 HISTORY — DX: Vitamin D deficiency, unspecified: E55.9

## 2015-06-26 MED ORDER — TRAMADOL HCL 50 MG PO TABS
50.0000 mg | ORAL_TABLET | Freq: Once | ORAL | Status: AC
  Administered 2015-06-26: 50 mg via ORAL

## 2015-06-26 MED ORDER — TRAMADOL HCL 50 MG PO TABS
ORAL_TABLET | ORAL | Status: AC
  Administered 2015-06-26: 50 mg via ORAL
  Filled 2015-06-26: qty 1
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
## 2015-06-26 NOTE — ED Notes (Signed)
Called [facility] at [phone]. Pt needs to be transferred back to facility via EMS.

## 2015-06-26 NOTE — Discharge Instructions (Signed)
Concussion, Adult °A concussion, or closed-head injury, is a brain injury caused by a direct blow to the head or by a quick and sudden movement (jolt) of the head or neck. Concussions are usually not life-threatening. Even so, the effects of a concussion can be serious. If you have had a concussion before, you are more likely to experience concussion-like symptoms after a direct blow to the head.  °CAUSES °· Direct blow to the head, such as from running into another player during a soccer game, being hit in a fight, or hitting your head on a hard surface. °· A jolt of the head or neck that causes the brain to move back and forth inside the skull, such as in a car crash. °SIGNS AND SYMPTOMS °The signs of a concussion can be hard to notice. Early on, they may be missed by you, family members, and health care providers. You may look fine but act or feel differently. °Symptoms are usually temporary, but they may last for days, weeks, or even longer. Some symptoms may appear right away while others may not show up for hours or days. Every head injury is different. Symptoms include: °· Mild to moderate headaches that will not go away. °· A feeling of pressure inside your head. °· Having more trouble than usual: °¨ Learning or remembering things you have heard. °¨ Answering questions. °¨ Paying attention or concentrating. °¨ Organizing daily tasks. °¨ Making decisions and solving problems. °· Slowness in thinking, acting or reacting, speaking, or reading. °· Getting lost or being easily confused. °· Feeling tired all the time or lacking energy (fatigued). °· Feeling drowsy. °· Sleep disturbances. °¨ Sleeping more than usual. °¨ Sleeping less than usual. °¨ Trouble falling asleep. °¨ Trouble sleeping (insomnia). °· Loss of balance or feeling lightheaded or dizzy. °· Nausea or vomiting. °· Numbness or tingling. °· Increased sensitivity to: °¨ Sounds. °¨ Lights. °¨ Distractions. °· Vision problems or eyes that tire  easily. °· Diminished sense of taste or smell. °· Ringing in the ears. °· Mood changes such as feeling sad or anxious. °· Becoming easily irritated or angry for little or no reason. °· Lack of motivation. °· Seeing or hearing things other people do not see or hear (hallucinations). °DIAGNOSIS °Your health care provider can usually diagnose a concussion based on a description of your injury and symptoms. He or she will ask whether you passed out (lost consciousness) and whether you are having trouble remembering events that happened right before and during your injury. °Your evaluation might include: °· A brain scan to look for signs of injury to the brain. Even if the test shows no injury, you may still have a concussion. °· Blood tests to be sure other problems are not present. °TREATMENT °· Concussions are usually treated in an emergency department, in urgent care, or at a clinic. You may need to stay in the hospital overnight for further treatment. °· Tell your health care provider if you are taking any medicines, including prescription medicines, over-the-counter medicines, and natural remedies. Some medicines, such as blood thinners (anticoagulants) and aspirin, may increase the chance of complications. Also tell your health care provider whether you have had alcohol or are taking illegal drugs. This information may affect treatment. °· Your health care provider will send you home with important instructions to follow. °· How fast you will recover from a concussion depends on many factors. These factors include how severe your concussion is, what part of your brain was injured,   your age, and how healthy you were before the concussion. °· Most people with mild injuries recover fully. Recovery can take time. In general, recovery is slower in older persons. Also, persons who have had a concussion in the past or have other medical problems may find that it takes longer to recover from their current injury. °HOME  CARE INSTRUCTIONS °General Instructions °· Carefully follow the directions your health care provider gave you. °· Only take over-the-counter or prescription medicines for pain, discomfort, or fever as directed by your health care provider. °· Take only those medicines that your health care provider has approved. °· Do not drink alcohol until your health care provider says you are well enough to do so. Alcohol and certain other drugs may slow your recovery and can put you at risk of further injury. °· If it is harder than usual to remember things, write them down. °· If you are easily distracted, try to do one thing at a time. For example, do not try to watch TV while fixing dinner. °· Talk with family members or close friends when making important decisions. °· Keep all follow-up appointments. Repeated evaluation of your symptoms is recommended for your recovery. °· Watch your symptoms and tell others to do the same. Complications sometimes occur after a concussion. Older adults with a brain injury may have a higher risk of serious complications, such as a blood clot on the brain. °· Tell your teachers, school nurse, school counselor, coach, athletic trainer, or work manager about your injury, symptoms, and restrictions. Tell them about what you can or cannot do. They should watch for: °¨ Increased problems with attention or concentration. °¨ Increased difficulty remembering or learning new information. °¨ Increased time needed to complete tasks or assignments. °¨ Increased irritability or decreased ability to cope with stress. °¨ Increased symptoms. °· Rest. Rest helps the brain to heal. Make sure you: °¨ Get plenty of sleep at night. Avoid staying up late at night. °¨ Keep the same bedtime hours on weekends and weekdays. °¨ Rest during the day. Take daytime naps or rest breaks when you feel tired. °· Limit activities that require a lot of thought or concentration. These include: °¨ Doing homework or job-related  work. °¨ Watching TV. °¨ Working on the computer. °· Avoid any situation where there is potential for another head injury (football, hockey, soccer, basketball, martial arts, downhill snow sports and horseback riding). Your condition will get worse every time you experience a concussion. You should avoid these activities until you are evaluated by the appropriate follow-up health care providers. °Returning To Your Regular Activities °You will need to return to your normal activities slowly, not all at once. You must give your body and brain enough time for recovery. °· Do not return to sports or other athletic activities until your health care provider tells you it is safe to do so. °· Ask your health care provider when you can drive, ride a bicycle, or operate heavy machinery. Your ability to react may be slower after a brain injury. Never do these activities if you are dizzy. °· Ask your health care provider about when you can return to work or school. °Preventing Another Concussion °It is very important to avoid another brain injury, especially before you have recovered. In rare cases, another injury can lead to permanent brain damage, brain swelling, or death. The risk of this is greatest during the first 7-10 days after a head injury. Avoid injuries by: °· Wearing a   seat belt when riding in a car. °· Drinking alcohol only in moderation. °· Wearing a helmet when biking, skiing, skateboarding, skating, or doing similar activities. °· Avoiding activities that could lead to a second concussion, such as contact or recreational sports, until your health care provider says it is okay. °· Taking safety measures in your home. °¨ Remove clutter and tripping hazards from floors and stairways. °¨ Use grab bars in bathrooms and handrails by stairs. °¨ Place non-slip mats on floors and in bathtubs. °¨ Improve lighting in dim areas. °SEEK MEDICAL CARE IF: °· You have increased problems paying attention or  concentrating. °· You have increased difficulty remembering or learning new information. °· You need more time to complete tasks or assignments than before. °· You have increased irritability or decreased ability to cope with stress. °· You have more symptoms than before. °Seek medical care if you have any of the following symptoms for more than 2 weeks after your injury: °· Lasting (chronic) headaches. °· Dizziness or balance problems. °· Nausea. °· Vision problems. °· Increased sensitivity to noise or light. °· Depression or mood swings. °· Anxiety or irritability. °· Memory problems. °· Difficulty concentrating or paying attention. °· Sleep problems. °· Feeling tired all the time. °SEEK IMMEDIATE MEDICAL CARE IF: °· You have severe or worsening headaches. These may be a sign of a blood clot in the brain. °· You have weakness (even if only in one hand, leg, or part of the face). °· You have numbness. °· You have decreased coordination. °· You vomit repeatedly. °· You have increased sleepiness. °· One pupil is larger than the other. °· You have convulsions. °· You have slurred speech. °· You have increased confusion. This may be a sign of a blood clot in the brain. °· You have increased restlessness, agitation, or irritability. °· You are unable to recognize people or places. °· You have neck pain. °· It is difficult to wake you up. °· You have unusual behavior changes. °· You lose consciousness. °MAKE SURE YOU: °· Understand these instructions. °· Will watch your condition. °· Will get help right away if you are not doing well or get worse. °  °This information is not intended to replace advice given to you by your health care provider. Make sure you discuss any questions you have with your health care provider. °  °Document Released: 11/22/2003 Document Revised: 09/22/2014 Document Reviewed: 03/24/2013 °Elsevier Interactive Patient Education ©2016 [employer] ° °Please return immediately if condition worsens.  Please contact her primary physician or the physician you were given for referral. If you have any specialist physicians involved in her treatment and plan please also contact them. Thank you for using [facility]. ° °
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
## 2015-06-26 NOTE — ED Notes (Signed)
Pt in room resting on stretcher. No complaints or concerns voiced at this time. Will continue to monitor

## 2015-06-29 ENCOUNTER — Encounter: Payer: Medicare (Managed Care) | Admitting: Surgery

## 2015-06-29 DIAGNOSIS — L97519 Non-pressure chronic ulcer of other part of right foot with unspecified severity: Secondary | ICD-10-CM | POA: Diagnosis not present

## 2015-06-30 NOTE — Progress Notes (Signed)
ILZE, ROSELLI ([id number]) Visit Report for 06/29/2015 Chief Complaint Document Details Patient Name: [name] [name], [name] [name] 06/29/2015 11:30 Date of Service: AM Medical Record [id number] Number: Patient Account Number: [id number] Jun 26, 1941 (74 y.o. Treating RN: Curtis Sites Date of Birth/Sex: Female) Other Clinician: Primary Care Physician: Monia Sabal Referring Physician: Idelle Leech Physician/Extender: Weeks in Treatment: 1 Information Obtained from: Patient Chief Complaint Patient presents to the wound care center for a consult due non healing wound. the patient came last week to the wound care center with a ulcerated area on the medial part of her right foot which she's had for about 6 weeks now. Electronic Signature(s) Signed: 06/29/2015 12:14:37 PM By: [name] MD, FACS Entered By: [name] on 06/29/2015 12:14:37 Junco, Selam ([id number]) -------------------------------------------------------------------------------- Debridement Details Patient Name: [name] [name] 06/29/2015 11:30 Date of Service: AM Medical Record [id number] Number: Patient Account Number: [id number] 04-28-1941 (74 y.o. Treating RN: Curtis Sites Date of Birth/Sex: Female) Other Clinician: Primary Care Physician: Idelle Leech Treating Sunny Aguon Referring Physician: Idelle Leech Physician/Extender: Weeks in Treatment: 1 Debridement Performed for Wound #1 Right,Lateral Toe Great Assessment: Performed By: Physician [name], MD Debridement: Debridement Pre-procedure Yes Verification/Time Out Taken: Start Time: 12:02 Pain Control: Lidocaine 4% Topical Solution Level: Skin/Subcutaneous Tissue Total Area Debrided (L x 4 (cm) x 2.2 (cm) = 8.8 (cm) W): Tissue and other Viable, Non-Viable, Fibrin/Slough, Skin, Subcutaneous material debrided: Instrument: Forceps, Scissors Bleeding: Minimum Hemostasis Achieved: Pressure End Time:  12:07 Procedural Pain: 0 Post Procedural Pain: 0 Response to Treatment: Procedure was tolerated well Post Debridement Measurements of Total Wound Length: (cm) 4 Width: (cm) 2.2 Depth: (cm) 0.2 Volume: (cm) 1.382 Post Procedure Diagnosis Same as Pre-procedure Electronic Signature(s) Signed: 06/29/2015 12:13:59 PM By: [name] MD, FACS Signed: 06/29/2015 5:06:32 PM By: Curtis Sites Entered By: [name] on 06/29/2015 12:13:59 Skare, Naraya ([id number]) -------------------------------------------------------------------------------- HPI Details Patient Name: [name] [name] 06/29/2015 11:30 Date of Service: AM Medical Record [id number] Number: Patient Account Number: [id number] 11/15/1940 (74 y.o. Treating RN: Curtis Sites Date of Birth/Sex: Female) Other Clinician: Primary Care Physician: Monia Sabal Referring Physician: Idelle Leech Physician/Extender: Weeks in Treatment: 1 History of Present Illness Location: wound on the medial part of her right forefoot near the first metatarsal Quality: Patient reports experiencing a dull pain to affected area(s). Severity: Patient states wound are getting worse. Duration: Patient has had the wound for < 6 weeks prior to presenting for treatment Timing: Pain in wound is Intermittent (comes and goes Context: The wound appeared gradually over time Modifying Factors: Consults to this date include: she was seen by her PCP but there is no evidence of any definite workup with x-rays. I understand she was advised IV ciprofloxacin and clindamycin but the patient refused Associated Signs and Symptoms: Patient reports having difficulty standing for long periods. HPI Description: 74 year old patient who is alert and oriented and comes to see [place] with a history of having a wound on the medial part of her right forefoot which she's had for about 6 weeks. she has a past medical history of diabetes mellitus  with peripheral angiopathy without gangrene, allergic rhinitis, hypertensive heart disease, glaucoma, chronic venous insufficiency, diverticulitis and asthma. the patient has never smoked cigarettes. I understand an x-ray may be done of the right foot and we will await this report prior to ordering further tests. Electronic Signature(s) Signed: 06/29/2015 12:18:14 PM By: [name] MD, FACS Entered By: [name] on 06/29/2015 12:18:14 Older, Gabriellah ([id number]) -------------------------------------------------------------------------------- Physical Exam Details Patient  Name: [name] [name] 06/29/2015 11:30 Date of Service: AM Medical Record [id number] Number: Patient Account Number: [id number] 1941/04/20 (75 y.o. Treating RN: Curtis Sites Date of Birth/Sex: Female) Other Clinician: Primary Care Physician: Monia Sabal Referring Physician: Idelle Leech Physician/Extender: Weeks in Treatment: 1 Constitutional . Pulse regular. Respirations normal and unlabored. Afebrile. . Eyes Nonicteric. Reactive to light. Ears, Nose, Mouth, and Throat Lips, teeth, and gums WNL.Marland Kitchen Moist mucosa without lesions . Neck supple and nontender. No palpable supraclavicular or cervical adenopathy. Normal sized without goiter. Respiratory WNL. No retractions.. Cardiovascular Pedal Pulses WNL. No clubbing, cyanosis or edema. Lymphatic No adneopathy. No adenopathy. No adenopathy. Musculoskeletal Adexa without tenderness or enlargement.. Digits and nails w/o clubbing, cyanosis, infection, petechiae, ischemia, or inflammatory conditions.. Integumentary (Hair, Skin) No suspicious lesions. No crepitus or fluctuance. No peri-wound warmth or erythema. No masses.Marland Kitchen Psychiatric Judgement and insight Intact.. No evidence of depression, anxiety, or agitation.. Notes The patient has an open ulceration on the medial part of her first metatarsal head and there is a lot  of necrotic debris surrounding the ulcer and the base of the ulcer which will need sharp debridement. Electronic Signature(s) Signed: 06/29/2015 12:19:12 PM By: [name] MD, FACS Entered By: [name] on 06/29/2015 12:19:12 DHARMA, PARE ([id number]) -------------------------------------------------------------------------------- Physician Orders Details Patient Name: RASHANA, ANDREW 06/29/2015 11:30 Date of Service: AM Medical Record [id number] Number: Patient Account Number: [id number] [birth year] (74 y.o. Treating RN: Curtis Sites Date of Birth/Sex: Female) Other Clinician: Primary Care Physician: Idelle Leech Treating Kadedra Vanaken Referring Physician: Idelle Leech Physician/Extender: [name] in Treatment: 1 Verbal / Phone Orders: Yes Clinician: Curtis Sites Read Back and Verified: Yes Diagnosis Coding Wound Cleansing Wound #1 Right,Lateral Toe Great o Clean wound with Normal Saline. o No tub bath. Anesthetic Wound #1 Right,Lateral Toe Great o Topical Lidocaine 4% cream applied to wound bed prior to debridement Primary Wound Dressing Wound #1 Right,Lateral Toe Great o Santyl Ointment Secondary Dressing Wound #1 Right,Lateral Toe Great o Dry Gauze o Boardered Foam Dressing Dressing Change Frequency Wound #1 Right,Lateral Toe Great o Change dressing every day. Follow-up Appointments Wound #1 Right,Lateral Toe Great o Return Appointment in 1 week. Additional Orders / Instructions Wound #1 Right,Lateral Toe Great o Increase protein intake. o Activity as tolerated Medications-please add to medication list. Wound #1 [address] [place] ([id number]) o P.O. Antibiotics - continue antibiotics prescribed by outside md Radiology o X-ray, foot - right oooo Electronic Signature(s) Signed: 06/29/2015 4:02:00 PM By: [name] MD, FACS Signed: 06/29/2015 5:06:32 PM By: Curtis Sites Entered By: Curtis Sites  on 06/29/2015 12:08:44 AI, SONNENFELD ([id number]) -------------------------------------------------------------------------------- Problem List Details Patient Name: MAYLIN, [place] 06/29/2015 11:30 Date of Service: AM Medical Record [id number] Number: Patient Account Number: [id number] September 13, 1941 (74 y.o. 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 Treating RN: Curtis Sites Date of Birth/Sex: Female) Other Clinician: Primary Care Physician: Idelle Leech Treating  Evlyn KannerBritto, [place] Referring Physician: Idelle LeechKWASCHYN, KATIE Physician/Extender: Tania AdeWeeks in Treatment: 1 Subjective Chief Complaint Information obtained from Patient Patient presents to the wound care center for a consult due non healing wound. the patient came last week to the wound care center with a ulcerated area on the medial part of her right foot which she's had for about 6 weeks now. History of Present Illness (HPI) The following HPI elements were documented for the patient's wound: Location: wound on the medial part of her right forefoot near the first  metatarsal Quality: Patient reports experiencing a dull pain to affected area(s). Severity: Patient states wound are getting worse. Duration: Patient has had the wound for < 6 weeks prior to presenting for treatment Timing: Pain in wound is Intermittent (comes and goes Context: The wound appeared gradually over time Modifying Factors: Consults to this date include: she was seen by her PCP but there is no evidence of any definite workup with x-rays. I understand she was advised IV ciprofloxacin and clindamycin but the patient refused Associated Signs and Symptoms: Patient reports having difficulty standing for long periods. 74 year old patient who is alert and oriented and comes to see us with a history of having a wound on the medial part of her right forefoot which she's had for about 6 weeks. she has a past medical history of diabetes mellitus with peripheral angiopathy without gangrene, allergic rhinitis, hypertensive heart disease, glaucoma, chronic venous insufficiency, diverticulitis and asthma. the patient has never smoked cigarettes. I understand an x-ray may be done of the right foot and we will await this report prior to ordering further tests. Medications Adult Low Dose Aspirin 81 mg tablet,delayed release oral tablet,delayed release (DR/EC) oral Tylenol 325 mg tablet oral tablet oral loperamide 2 mg tablet oral tablet oral cetirizine 5 mg tablet oral tablet oral Flori, Wreatha ([id number]) metformin 850 mg tablet oral tablet oral enalapril maleate 20 mg tablet oral 1 tablet oral twice daily Ventolin HFA 90 mcg/actuation aerosol inhaler inhalation HFA aerosol inhaler inhalation calcium polycarbophil 625 mg tablet oral tablet oral Cleocin 150 mg capsule oral capsule oral Cleocin 600 mg/50 mL in 5 % dextrose intravenous piggyback intravenous piggyback intravenous every 6 hours for osteomyelitis Alphagan P 0.15 % eye drops ophthalmic drops ophthalmic Lumigan 0.01 % eye drops  ophthalmic drops ophthalmic timolol maleate 0.5 % eye drops ophthalmic drops ophthalmic ciprofloxacin 500 mg/5 mL oral suspension oral suspension,microcapsule recon oral twice daily ciprofloxacin 400 mg/200 mL in 5 % dextrose intravenous piggyback intravenous piggyback intravenous every 12 hours for osteomyelitis levothyroxine 75 mcg capsule oral 1 1 capsule oral daily clotrimazole 1 % topical cream topical cream topical Drisdol 50,000 unit capsule oral capsule oral Objective Constitutional Pulse regular. Respirations normal and unlabored. Afebrile. Vitals Time Taken: 10:49 AM, Height: 66 in, Weight: 195 lbs, BMI: 31.5, Temperature: 98.3 F, Pulse: 73 bpm, Respiratory Rate: 18 breaths/min, Blood Pressure: 129/44 mmHg. Eyes Nonicteric. Reactive to light. Ears, Nose, Mouth, and Throat Lips, teeth, and gums WNL.Marland Kitchen. Moist mucosa without lesions . Neck supple and nontender. No palpable supraclavicular or cervical adenopathy. Normal sized without goiter. Respiratory WNL. No retractions.. Cardiovascular Pedal Pulses WNL. No clubbing, cyanosis or edema. Lymphatic No adneopathy. No adenopathy. No adenopathy. Musculoskeletal Adexa without tenderness or enlargement.. Digits and nails w/o clubbing, cyanosis, infection, petechiae, [place], Angelik ([id number]) ischemia, or inflammatory conditions.Marland Kitchen. Psychiatric Judgement and insight Intact.. No evidence of depression, anxiety, or agitation.. General Notes: The patient has an open ulceration on the medial part of her first metatarsal head and  there is a lot of necrotic debris surrounding the ulcer and the base of the ulcer which will need sharp debridement. Integumentary (Hair, Skin) No suspicious lesions. No crepitus or fluctuance. No peri-wound warmth or erythema. No masses.. Wound #1 status is Open. Original cause of wound was Gradually Appeared. The wound is located on the [employer]. The wound measures 4cm length x 2.2cm width x  0.2cm depth; 6.912cm^2 area and 1.382cm^3 volume. The wound is limited to skin breakdown. There is no tunneling or undermining noted. There is a medium amount of purulent drainage noted. The wound margin is indistinct and nonvisible. There is small (1-33%) red granulation within the wound bed. There is a large (67-100%) amount of necrotic tissue within the wound bed including Adherent Slough. The periwound skin appearance did not exhibit: Callus, Crepitus, Excoriation, Fluctuance, Friable, Induration, Localized Edema, Rash, Scarring, Dry/Scaly, Maceration, Moist, Atrophie Blanche, Cyanosis, Ecchymosis, Hemosiderin Staining, Mottled, Pallor, Rubor, Erythema. The periwound has tenderness on palpation. 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 Depending on this she may need an MRI. I understand the patient was advised IV antibiotics but has not taken any. I have recommended that we apply Santyl to this wound on a daily basis with a bordered foam dressing and get a workup done to see if there is involvement of the bone. At the present time she has some fascia covering the bone and there is no exposed bone and does not probe down to the bone. Procedures Nunnelley, Valarie ([id number]) Wound #1 Wound #1 is a Diabetic Wound/Ulcer of the Lower Extremity located on the Right,Lateral Toe Great . There was a Skin/Subcutaneous Tissue Debridement ([id number]) debridement with total area of 8.8 sq cm performed by [name], MD. with the following instrument(s): Forceps and Scissors to remove Viable and Non-Viable tissue/material including Fibrin/Slough, Skin, and Subcutaneous after achieving pain control using  Lidocaine 4% Topical Solution. A time out was conducted prior to the start of the procedure. A Minimum amount of bleeding was controlled with Pressure. The procedure was tolerated well with a pain level of 0 throughout and a pain level of 0 following the procedure. Post Debridement Measurements: 4cm length x 2.2cm width x 0.2cm depth; 1.382cm^3 volume. Post procedure Diagnosis Wound #1: Same as Pre-Procedure Plan Wound Cleansing: Wound #1 Right,Lateral Toe Great: Clean wound with Normal Saline. No tub bath. Anesthetic: Wound #1 Right,Lateral Toe Great: Topical Lidocaine 4% cream applied to wound bed prior to debridement Primary Wound Dressing: Wound #1 Right,Lateral Toe Great: Santyl Ointment Secondary Dressing: Wound #1 Right,Lateral Toe Great: Dry Gauze Boardered Foam Dressing Dressing Change Frequency: Wound #1 Right,Lateral Toe Great: Change dressing every day. Follow-up Appointments: Wound #1 Right,Lateral Toe Great: Return Appointment in 1 week. Additional Orders / Instructions: Wound #1 Right,Lateral Toe Great: Increase protein intake. Activity as tolerated Medications-please add to medication list.: Wound #1 Right,Lateral Toe Great: P.O. Antibiotics - continue antibiotics prescribed by outside md Radiology ordered were: X-ray, foot - right RAYMA, HEGG ([id number]) This 74 year old patient has a fairly deep ulceration of the right forefoot medially near the head of the first metatarsal which needs an x-ray if not already done. Depending on this she may need an MRI. I understand the patient  was advised IV antibiotics but has not taken any. I have recommended that we apply Santyl to this wound on a daily basis with a bordered foam dressing and get a workup done to see if there is involvement of the bone. At the present time she has some fascia covering the bone and there is no exposed bone and does not probe down to the bone. She will be seen back here next  week. Electronic Signature(s) Signed: 06/29/2015 12:21:28 PM By: [name] MD, FACS Entered By: [name] on 06/29/2015 12:21:28 SHAYLON, GILLEAN ([id number]) -------------------------------------------------------------------------------- SuperBill Details Patient Name: [name] Date of Service: 06/29/2015 Medical Record Number: [id number] Patient Account Number: [id number] Date of Birth/Sex: 31-Jul-1941 (74 y.o. Female) Treating RN: Curtis Sites Primary Care Physician: Idelle Leech Other Clinician: Referring Physician: Idelle Leech Treating Physician/Extender: Rudene Re in Treatment: 1 Diagnosis Coding ICD-10 Codes Code Description [phone] Type 2 diabetes mellitus with foot ulcer L97.513 Non-pressure chronic ulcer of other part of right foot with necrosis of muscle Facility Procedures CPT4: Description Modifier Quantity Code [id number] 11042 - DEB SUBQ TISSUE 20 SQ CM/< 1 ICD-10 Description Diagnosis E11.621 Type 2 diabetes mellitus with foot ulcer L97.513 Non-pressure chronic ulcer of other part of right foot with necrosis of muscle Physician Procedures CPT4: Description Modifier Quantity Code [id number] 11042 - WC PHYS SUBQ TISS 20 SQ CM 1 ICD-10 Description Diagnosis E11.621 Type 2 diabetes mellitus with foot ulcer L97.513 Non-pressure chronic ulcer of other part of right foot with necrosis of muscle Electronic Signature(s) Signed: 06/29/2015 12:21:38 PM By: [name] MD, FACS Entered By: [name] on 06/29/2015 12:21:38

## 2015-06-30 NOTE — Progress Notes (Signed)
[name] [name], [name] [name] ([id number]) Visit Report for 06/29/2015 Arrival Information Details Patient Name: [name] [name], [name] [name] Date of Service: 06/29/2015 11:30 AM Medical Record Number: [id number] Patient Account Number: [id number] Date of Birth/Sex: 06-28-1941 (74 y.o. Female) Treating RN: Curtis Sitesorthy, Joanna Primary Care Physician: Idelle LeechKWASCHYN, KATIE Other Clinician: Referring Physician: Idelle LeechKWASCHYN, KATIE Treating Physician/Extender: Rudene ReBritto, Errol Weeks in Treatment: 1 Visit Information History Since Last Visit Added or deleted any medications: No Patient Arrived: Wheel Chair Any new allergies or adverse reactions: No Arrival Time: 11:43 Had a fall or experienced change in No Accompanied By: self activities of daily living that may affect Transfer Assistance: Manual risk of falls: Patient Identification Verified: Yes Signs or symptoms of abuse/neglect since last No Secondary Verification Process Yes visito Completed: Hospitalized since last visit: No Patient Has Alerts: Yes Pain Present Now: No Patient Alerts: Patient on Blood Thinner Type II Diabetic 81mg  Aspirin Electronic Signature(s) Signed: 06/29/2015 5:06:32 PM By: Curtis Sitesorthy, Joanna Entered By: Curtis Sitesorthy, Joanna on 06/29/2015 11:47:22 Eddins, Lorrayne ([id number]) -------------------------------------------------------------------------------- Encounter Discharge Information Details Patient Name: [name] [name], [name] [name] Date of Service: 06/29/2015 11:30 AM Medical Record Number: [id number] Patient Account Number: [id number] Date of Birth/Sex: 06-28-1941 (74 y.o. Female) Treating RN: Curtis Sitesorthy, Joanna Primary Care Physician: Idelle LeechKWASCHYN, KATIE Other Clinician: Referring Physician: Idelle LeechKWASCHYN, KATIE Treating Physician/Extender: Rudene ReBritto, Errol Weeks in Treatment: 1 Encounter Discharge Information Items Schedule Follow-up Appointment: No Medication Reconciliation completed and provided to Patient/Care No Izetta Sakamoto: Provided on Clinical Summary of  Care: 06/29/2015 Form Type Recipient Paper Patient ML Electronic Signature(s) Signed: 06/29/2015 12:22:51 PM By: Francie MassingKelly, Tia Entered By: Francie MassingKelly, Tia on 06/29/2015 12:22:51 Vaquera, Ingris ([id number]) -------------------------------------------------------------------------------- Lower Extremity Assessment Details Patient Name: [name] [name], [name] [name] Date of Service: 06/29/2015 11:30 AM Medical Record Number: [id number] Patient Account Number: [id number] Date of Birth/Sex: 06-28-1941 (74 y.o. Female) Treating RN: Curtis Sitesorthy, Joanna Primary Care Physician: Idelle LeechKWASCHYN, KATIE Other Clinician: Referring Physician: Idelle LeechKWASCHYN, KATIE Treating Physician/Extender: Rudene ReBritto, Errol Weeks in Treatment: 1 Vascular Assessment Pulses: Posterior Tibial Dorsalis Pedis Palpable: [Right:Yes] Extremity colors, hair growth, and conditions: Extremity Color: [Right:Hyperpigmented] Hair Growth on Extremity: [Right:No] Temperature of Extremity: [Right:Warm] Capillary Refill: [Right:< 3 seconds] Toe Nail Assessment Left: Right: Thick: Yes Discolored: Yes Deformed: No Improper Length and Hygiene: No Electronic Signature(s) Signed: 06/29/2015 5:06:32 PM By: Curtis Sitesorthy, Joanna Entered By: Curtis Sitesorthy, Joanna on 06/29/2015 11:50:43 Gift, Aleka ([id number]) -------------------------------------------------------------------------------- Multi Wound Chart Details Patient Name: [name] [name], [name] [name] Date of Service: 06/29/2015 11:30 AM Medical Record Number: [id number] Patient Account Number: [id number] Date of Birth/Sex: 06-28-1941 (74 y.o. Female) Treating RN: Curtis Sitesorthy, Joanna Primary Care Physician: Idelle LeechKWASCHYN, KATIE Other Clinician: Referring Physician: Idelle LeechKWASCHYN, KATIE Treating Physician/Extender: Rudene ReBritto, Errol Weeks in Treatment: 1 Vital Signs Height(in): 66 Pulse(bpm): 73 Weight(lbs): 195 Blood Pressure 129/44 (mmHg): Body Mass Index(BMI): 31 Temperature(F): 98.3 Respiratory Rate 18 (breaths/min): Photos: [1:No Photos]  [N/A:N/A] Wound Location: [1:Right Toe Great - Lateral N/A] Wounding Event: [1:Gradually Appeared] [N/A:N/A] Primary Etiology: [1:Diabetic Wound/Ulcer of N/A the Lower Extremity] Comorbid History: [1:Cataracts, Glaucoma, Peripheral Venous Disease, Type II Diabetes, Osteoarthritis, Osteomyelitis, Neuropathy] [N/A:N/A] Date Acquired: [1:05/28/2015] [N/A:N/A] Weeks of Treatment: [1:1] [N/A:N/A] Wound Status: [1:Open] [N/A:N/A] Measurements L x W x D 4x2.2x0.2 [N/A:N/A] (cm) Area (cm) : [1:6.912] [N/A:N/A] Volume (cm) : [1:1.382] [N/A:N/A] % Reduction in Area: [1:26.70%] [N/A:N/A] % Reduction in Volume: 51.10% [N/A:N/A] Classification: [1:Grade 1] [N/A:N/A] Exudate Amount: [1:Medium] [N/A:N/A] Exudate Type: [1:Purulent] [N/A:N/A] Exudate Color: [1:yellow, brown, green] [N/A:N/A] Wound Margin: [1:Indistinct, nonvisible] [N/A:N/A] Granulation Amount: [1:Small (1-33%)] [N/A:N/A] Granulation Quality: [1:Red] [N/A:N/A] Necrotic Amount: [1:Large (67-100%)] [N/A:N/A] Exposed Structures: [1:Fascia: No Fat: No Tendon: No] [N/A:N/A] Muscle: No  Joint: No Bone: No Limited to Skin Breakdown Epithelialization: None N/A N/A Periwound Skin Texture: Edema: No N/A N/A Excoriation: No Induration: No Callus: No Crepitus: No Fluctuance: No Friable: No Rash: No Scarring: No Periwound Skin Maceration: No N/A N/A Moisture: Moist: No Dry/Scaly: No Periwound Skin Color: Atrophie Blanche: No N/A N/A Cyanosis: No Ecchymosis: No Erythema: No Hemosiderin Staining: No Mottled: No Pallor: No Rubor: No Tenderness on Yes N/A N/A Palpation: Wound Preparation: Ulcer Cleansing: N/A N/A Rinsed/Irrigated with Saline Topical Anesthetic Applied: Other: lidocaine 4% Treatment Notes Electronic Signature(s) Signed: 06/29/2015 5:06:32 PM By: Curtis Sites Entered By: Curtis Sites on 06/29/2015 11:53:40 Tabora, Alga  ([id number]) -------------------------------------------------------------------------------- Multi-Disciplinary Care Plan Details Patient Name: [name] Spittle Date of Service: 06/29/2015 11:30 AM Medical Record Number: [id number] Patient Account Number: [id number] Date of Birth/Sex: [birth year] (74 y.o. Female) Treating RN: Curtis Sites Primary Care Physician: Idelle Leech Other Clinician: Referring Physician: Idelle Leech Treating Physician/Extender: Rudene Re in Treatment: 1 Active Inactive Abuse / Safety / Falls / Self Care Management Nursing Diagnoses: Impaired physical mobility Potential for falls Goals: Patient will remain injury free Date Initiated: 06/21/2015 Goal Status: Active Interventions: Assess fall risk on admission and as needed Notes: Nutrition Nursing Diagnoses: Impaired glucose control: actual or potential Goals: Patient/caregiver verbalizes understanding of need to maintain therapeutic glucose control per primary care physician Date Initiated: 06/21/2015 Goal Status: Active Interventions: Provide education on elevated blood sugars and impact on wound healing Notes: Orientation to the Wound Care Program Nursing Diagnoses: Knowledge deficit related to the wound healing center program GoalsSHEREENA, BERQUIST ([id number]) Patient/caregiver will verbalize understanding of the Wound Healing Center Program Date Initiated: 06/21/2015 Goal Status: Active Interventions: Provide education on orientation to the wound center Notes: Soft Tissue Infection Nursing Diagnoses: Impaired tissue integrity Potential for infection: soft tissue Goals: Patient's soft tissue infection will resolve Date Initiated: 06/21/2015 Goal Status: Active Interventions: Assess signs and symptoms of infection every visit Treatment Activities: Culture and sensitivity : 06/29/2015 Systemic antibiotics : 06/29/2015 Notes: Wound/Skin Impairment Nursing  Diagnoses: Impaired tissue integrity Goals: Ulcer/skin breakdown will heal within 14 weeks Date Initiated: 06/21/2015 Goal Status: Active Interventions: Assess ulceration(s) every visit Treatment Activities: Topical wound management initiated : 06/29/2015 Notes: CAILAN, ANTONUCCI ([id number]) Electronic Signature(s) Signed: 06/29/2015 5:06:32 PM By: Curtis Sites Entered By: Curtis Sites on 06/29/2015 11:53:32 Weatherholtz, [name] [name] ([id number]) -------------------------------------------------------------------------------- Wound Assessment Details Patient Name: [name] Spittle Date of Service: 06/29/2015 11:30 AM Medical Record Number: [id number] Patient Account Number: [id number] Date of Birth/Sex: 28-Jul-1941 (74 y.o. Female) Treating RN: Curtis Sites Primary Care Physician: Idelle Leech Other Clinician: Referring Physician: Idelle Leech Treating Physician/Extender: Rudene Re in Treatment: 1 Wound Status Wound Number: 1 Primary Diabetic Wound/Ulcer of the Lower Etiology: Extremity Wound Location: Right Toe Great - Lateral Wound Open Wounding Event: Gradually Appeared Status: Date Acquired: 05/28/2015 Comorbid Cataracts, Glaucoma, Peripheral Weeks Of Treatment: 1 History: Venous Disease, Type II Diabetes, Clustered Wound: No Osteoarthritis, Osteomyelitis, Neuropathy Photos Photo Uploaded By: Curtis Sites on 06/29/2015 16:36:06 Wound Measurements Length: (cm) 4 Width: (cm) 2.2 Depth: (cm) 0.2 Area: (cm) 6.912 Volume: (cm) 1.382 % Reduction in Area: 26.7% % Reduction in Volume: 51.1% Epithelialization: None Tunneling: No Undermining: No Wound Description Classification: Grade 1 Wound Margin: Indistinct, nonvisible Exudate Amount: Medium Exudate Type: Purulent Exudate Color: yellow, brown, green Foul Odor After Cleansing: No Wound Bed Granulation Amount: Small (1-33%) Exposed Structure Granulation Quality: Red Fascia Exposed: No Necrotic  Amount: Large (67-100%) Fat Layer Exposed: No Schulke, Ninette ([id number]) Necrotic Quality: Adherent Slough  Tendon Exposed: No Muscle Exposed: No Joint Exposed: No Bone Exposed: No Limited to Skin Breakdown Periwound Skin Texture Texture Color No Abnormalities Noted: No No Abnormalities Noted: No Callus: No Atrophie Blanche: No Crepitus: No Cyanosis: No Excoriation: No Ecchymosis: No Fluctuance: No Erythema: No Friable: No Hemosiderin Staining: No Induration: No Mottled: No Localized Edema: No Pallor: No Rash: No Rubor: No Scarring: No Temperature / Pain Moisture Tenderness on Palpation: Yes No Abnormalities Noted: No Dry / Scaly: No Maceration: No Moist: No Wound Preparation Ulcer Cleansing: Rinsed/Irrigated with Saline Topical Anesthetic Applied: Other: lidocaine 4%, Treatment Notes Wound #1 (Right, Lateral Toe Great) 1. Cleansed with: Clean wound with Normal Saline 2. Anesthetic Topical Lidocaine 4% cream to wound bed prior to debridement 4. Dressing Applied: Santyl Ointment 5. Secondary Dressing Applied Bordered Foam Dressing Dry Gauze Electronic Signature(s) Signed: 06/29/2015 5:06:32 PM By: Curtis Sites Entered By: Curtis Sites on 06/29/2015 11:53:18 Rybarczyk, Durenda ([id number]) -------------------------------------------------------------------------------- Vitals Details Patient Name: [name] Spittle Date of Service: 06/29/2015 11:30 AM Medical Record Number: [id number] Patient Account Number: [id number] Date of Birth/Sex: [birth year] (74 y.o. Female) Treating RN: Curtis Sites Primary Care Physician: Idelle Leech Other Clinician: Referring Physician: Idelle Leech Treating Physician/Extender: Rudene Re in Treatment: 1 Vital Signs Time Taken: 10:49 Temperature (F): 98.3 Height (in): 66 Pulse (bpm): 73 Weight (lbs): 195 Respiratory Rate (breaths/min): 18 Body Mass Index (BMI): 31.5 Blood Pressure (mmHg): 129/44 Reference  Range: 80 - 120 mg / dl Electronic Signature(s) Signed: 06/29/2015 5:06:32 PM By: Curtis Sites Entered By: Curtis Sites on 06/29/2015 11:50:09

## 2015-07-06 ENCOUNTER — Encounter (HOSPITAL_BASED_OUTPATIENT_CLINIC_OR_DEPARTMENT_OTHER): Payer: Medicare (Managed Care) | Admitting: General Surgery

## 2015-07-06 ENCOUNTER — Encounter: Payer: Self-pay | Admitting: General Surgery

## 2015-07-06 DIAGNOSIS — E1169 Type 2 diabetes mellitus with other specified complication: Secondary | ICD-10-CM | POA: Diagnosis not present

## 2015-07-06 DIAGNOSIS — L97519 Non-pressure chronic ulcer of other part of right foot with unspecified severity: Secondary | ICD-10-CM | POA: Diagnosis not present

## 2015-07-06 DIAGNOSIS — E11621 Type 2 diabetes mellitus with foot ulcer: Secondary | ICD-10-CM | POA: Diagnosis not present

## 2015-07-06 DIAGNOSIS — M869 Osteomyelitis, unspecified: Secondary | ICD-10-CM

## 2015-07-06 DIAGNOSIS — L97509 Non-pressure chronic ulcer of other part of unspecified foot with unspecified severity: Secondary | ICD-10-CM

## 2015-07-06 NOTE — Progress Notes (Addendum)
[name] [name], [place] ([id number]) Visit Report for 07/06/2015 Arrival Information Details Patient Name: [name] [name], [name] [name] Date of Service: 07/06/2015 11:30 AM Medical Record Number: [id number] Patient Account Number: [id number] Date of Birth/Sex: [birth year] (74 y.o. Female) Treating RN: Afful, RN, BSN, [place] Sinkita Primary Care Physician: Idelle LeechKWASCHYN, KATIE Other Clinician: Referring Physician: Idelle LeechKWASCHYN, KATIE Treating Physician/Extender: Elayne SnarePARKER, PETER Weeks in Treatment: 2 Visit Information History Since Last Visit Any new allergies or adverse reactions: No Patient Arrived: Wheel Chair Had a fall or experienced change in No Arrival Time: 11:20 activities of daily living that may affect Accompanied By: caregiver risk of falls: Transfer Assistance: Manual Signs or symptoms of abuse/neglect since last No Patient Identification Verified: Yes visito Secondary Verification Process Yes Hospitalized since last visit: No Completed: Has Dressing in Place as Prescribed: Yes Patient Has Alerts: Yes Pain Present Now: No Patient Alerts: Patient on Blood Thinner Type II Diabetic 81mg  Aspirin Electronic Signature(s) Signed: 07/06/2015 11:21:23 AM By: Elpidio EricAfful, Rita BSN, RN Entered By: Elpidio EricAfful, Rita on 07/06/2015 11:21:23 [name] [name], [name] [name] ([id number]) -------------------------------------------------------------------------------- Encounter Discharge Information Details Patient Name: [name] [name], [name] [name] Date of Service: 07/06/2015 11:30 AM Medical Record Number: [id number] Patient Account Number: [id number] Date of Birth/Sex: [birth year] (74 y.o. Female) Treating RN: Clover MealyAfful, RN, BSN, [place] Sinkita Primary Care Physician: Idelle LeechKWASCHYN, KATIE Other Clinician: Referring Physician: Idelle LeechKWASCHYN, KATIE Treating Physician/Extender: Elayne SnarePARKER, PETER Weeks in Treatment: 2 Encounter Discharge Information Items Discharge Pain Level: 0 Discharge Condition: Stable Ambulatory Status: Wheelchair Discharge Destination: Home Transportation:  Other Accompanied By: caregiver Schedule Follow-up Appointment: No Medication Reconciliation completed and provided to Patient/Care No Ivan Maskell: Provided on Clinical Summary of Care: 07/06/2015 Form Type Recipient Paper Patient ML Electronic Signature(s) Signed: 07/06/2015 11:40:04 AM By: Ardath SaxParker, Peter MD Previous Signature: 07/06/2015 11:39:04 AM Version By: Gwenlyn PerkingMoore, Shelia Previous Signature: 07/06/2015 11:36:45 AM Version By: Elpidio EricAfful, Rita BSN, RN Entered By: Ardath SaxParker, Peter on 07/06/2015 11:40:04 Zellman, Sanjuana ([id number]) -------------------------------------------------------------------------------- Lower Extremity Assessment Details Patient Name: [name] [name], [name] [name] Date of Service: 07/06/2015 11:30 AM Medical Record Number: [id number] Patient Account Number: [id number] Date of Birth/Sex: [birth year] (74 y.o. Female) Treating RN: Afful, RN, BSN, [place] Sinkita Primary Care Physician: Idelle LeechKWASCHYN, KATIE Other Clinician: Referring Physician: Idelle LeechKWASCHYN, KATIE Treating Physician/Extender: Elayne SnarePARKER, PETER Weeks in Treatment: 2 Vascular Assessment Pulses: Posterior Tibial Dorsalis Pedis Palpable: [Right:Yes] Extremity colors, hair growth, and conditions: Extremity Color: [Right:Hyperpigmented] Hair Growth on Extremity: [Right:No] Temperature of Extremity: [Right:Warm] Capillary Refill: [Right:< 3 seconds] Electronic Signature(s) Signed: 07/06/2015 11:23:47 AM By: Elpidio EricAfful, Rita BSN, RN Entered By: Elpidio EricAfful, Rita on 07/06/2015 11:23:47 Calise, Karrine ([id number]) -------------------------------------------------------------------------------- Multi Wound Chart Details Patient Name: [name] [name], [name] [name] Date of Service: 07/06/2015 11:30 AM Medical Record Number: [id number] Patient Account Number: [id number] Date of Birth/Sex: [birth year] (74 y.o. Female) Treating RN: Curtis Sitesorthy, Joanna Primary Care Physician: Idelle LeechKWASCHYN, KATIE Other Clinician: Referring Physician: Idelle LeechKWASCHYN, KATIE Treating Physician/Extender:  Elayne SnarePARKER, PETER Weeks in Treatment: 2 Vital Signs Height(in): 66 Pulse(bpm): 72 Weight(lbs): 195 Blood Pressure 116/53 (mmHg): Body Mass Index(BMI): 31 Temperature(F): 98.3 Respiratory Rate 18 (breaths/min): Photos: [1:No Photos] [N/A:N/A] Wound Location: [1:Right Toe Great - Lateral N/A] Wounding Event: [1:Gradually Appeared] [N/A:N/A] Primary Etiology: [1:Diabetic Wound/Ulcer of N/A the Lower Extremity] Comorbid History: [1:Cataracts, Glaucoma, Peripheral Venous Disease, Type II Diabetes, Osteoarthritis, Osteomyelitis, Neuropathy] [N/A:N/A] Date Acquired: [1:05/28/2015] [N/A:N/A] Weeks of Treatment: [1:2] [N/A:N/A] Wound Status: [1:Open] [N/A:N/A] Measurements L x W x D 2x2x0.2 [N/A:N/A] (cm) Area (cm) : [1:3.142] [N/A:N/A] Volume (cm) : [1:0.628] [N/A:N/A] % Reduction in Area: [1:66.70%] [N/A:N/A] % Reduction in Volume: 77.80% [N/A:N/A] Classification: [1:Grade 1] [N/A:N/A] Exudate Amount: [1:Medium] [N/A:N/A] Exudate Type: [1:Purulent] [N/A:N/A] Exudate Color: [  1:yellow, brown, green] [N/A:N/A] Wound Margin: [1:Indistinct, nonvisible] [N/A:N/A] Granulation Amount: [1:Small (1-33%)] [N/A:N/A] Granulation Quality: [1:Red] [N/A:N/A] Necrotic Amount: [1:Large (67-100%)] [N/A:N/A] Exposed Structures: [1:Fascia: No Fat: No Tendon: No] [N/A:N/A] Muscle: No Joint: No Bone: No Limited to Skin Breakdown Epithelialization: None N/A N/A Periwound Skin Texture: Callus: Yes N/A N/A Edema: No Excoriation: No Induration: No Crepitus: No Fluctuance: No Friable: No Rash: No Scarring: No Periwound Skin Maceration: Yes N/A N/A Moisture: Moist: Yes Dry/Scaly: No Periwound Skin Color: Atrophie Blanche: No N/A N/A Cyanosis: No Ecchymosis: No Erythema: No Hemosiderin Staining: No Mottled: No Pallor: No Rubor: No Tenderness on Yes N/A N/A Palpation: Wound Preparation: Ulcer Cleansing: N/A N/A Rinsed/Irrigated with Saline Topical Anesthetic Applied: Other:  lidocaine 4% Treatment Notes Electronic Signature(s) Signed: 07/06/2015 3:16:02 PM By: Curtis Sites Entered By: Curtis Sites on 07/06/2015 11:33:15 Ross, Elana ([id number]) -------------------------------------------------------------------------------- Multi-Disciplinary Care Plan Details Patient Name: [name] Spittle Date of Service: 07/06/2015 11:30 AM Medical Record Number: [id number] Patient Account Number: [id number] Date of Birth/Sex: 1941-02-03 (74 y.o. Female) Treating RN: Curtis Sites Primary Care Physician: Idelle Leech Other Clinician: Referring Physician: Idelle Leech Treating Physician/Extender: Elayne Snare in Treatment: 2 Active Inactive Abuse / Safety / Falls / Self Care Management Nursing Diagnoses: Impaired physical mobility Potential for falls Goals: Patient will remain injury free Date Initiated: 06/21/2015 Goal Status: Active Interventions: Assess fall risk on admission and as needed Notes: Nutrition Nursing Diagnoses: Impaired glucose control: actual or potential Goals: Patient/caregiver verbalizes understanding of need to maintain therapeutic glucose control per primary care physician Date Initiated: 06/21/2015 Goal Status: Active Interventions: Provide education on elevated blood sugars and impact on wound healing Notes: Orientation to the Wound Care Program Nursing Diagnoses: Knowledge deficit related to the wound healing center program GoalsJALYRIC, [name] [name] ([id number]) Patient/caregiver will verbalize understanding of the Wound Healing Center Program Date Initiated: 06/21/2015 Goal Status: Active Interventions: Provide education on orientation to the wound center Notes: Soft Tissue Infection Nursing Diagnoses: Impaired tissue integrity Potential for infection: soft tissue Goals: Patient's soft tissue infection will resolve Date Initiated: 06/21/2015 Goal Status: Active Interventions: Assess signs and symptoms of  infection every visit Treatment Activities: Culture and sensitivity : 07/06/2015 Systemic antibiotics : 07/06/2015 Notes: Wound/Skin Impairment Nursing Diagnoses: Impaired tissue integrity Goals: Ulcer/skin breakdown will heal within 14 weeks Date Initiated: 06/21/2015 Goal Status: Active Interventions: Assess ulceration(s) every visit Treatment Activities: Topical wound management initiated : 07/06/2015 Notes: [name] [name], [name] [name] ([id number]) Electronic Signature(s) Signed: 07/06/2015 3:16:02 PM By: Curtis Sites Entered By: Curtis Sites on 07/06/2015 11:33:03 [name] [name], [name] [name] ([id number]) -------------------------------------------------------------------------------- Pain Assessment Details Patient Name: [name] Spittle Date of Service: 07/06/2015 11:30 AM Medical Record Number: [id number] Patient Account Number: [id number] Date of Birth/Sex: [birth year] (74 y.o. Female) Treating RN: Clover Mealy, RN, BSN, [place] Sink Primary Care Physician: Idelle Leech Other Clinician: Referring Physician: Idelle Leech Treating Physician/Extender: Elayne Snare in Treatment: 2 Active Problems Location of Pain Severity and Description of Pain Patient Has Paino No Site Locations Pain Management and Medication Current Pain Management: Electronic Signature(s) Signed: 07/06/2015 11:21:31 AM By: Elpidio Eric BSN, RN Entered By: Elpidio Eric on 07/06/2015 11:21:31 [name] [name], [name] [name] ([id number]) -------------------------------------------------------------------------------- Patient/Caregiver Education Details Patient Name: [name] Spittle Date of Service: 07/06/2015 11:30 AM Medical Record Number: [id number] Patient Account Number: [id number] Date of Birth/Gender: [birth year] (74 y.o. Female) Treating RN: Afful, RN, BSN, [place] Sink Primary Care Physician: Idelle Leech Other Clinician: Referring Physician: Idelle Leech Treating Physician/Extender: Elayne Snare in Treatment: 2 Education  Assessment Education Provided To: Patient Education Topics Provided Elevated Blood Sugar/  Impact on Healing: Methods: Explain/Verbal Responses: State content correctly Welcome To The W[facility] Care Center: Methods: Explain/Verbal Responses: State content correctly Electronic Signature(s) Signed: 07/06/2015 11:40:22 AM By: Ardath Sax MD Previous Signature: 07/06/2015 11:37:01 AM Version By: Elpidio Eric BSN, RN Entered By: Ardath Sax on 07/06/2015 11:40:18 Crespi, [place] ([id number]) -------------------------------------------------------------------------------- Wound Assessment Details Patient Name: [name] Spittle Date of Service: 07/06/2015 11:30 AM Medical Record Number: [id number] Patient Account Number: [id number] Date of Birth/Sex: [birth year] (74 y.o. Female) Treating RN: Afful, RN, BSN, Rita Primary Care Physician: Idelle Leech Other Clinician: Referring Physician: Idelle Leech Treating Physician/Extender: Elayne Snare in Treatment: 2 Wound Status Wound Number: 1 Primary Diabetic Wound/Ulcer of the Lower Etiology: Extremity Wound Location: Right Toe Great - Lateral Wound Open Wounding Event: Gradually Appeared Status: Date Acquired: 05/28/2015 Comorbid Cataracts, Glaucoma, Peripheral Weeks Of Treatment: 2 History: Venous Disease, Type II Diabetes, Clustered Wound: No Osteoarthritis, Osteomyelitis, Neuropathy Wound Measurements Length: (cm) 2 Width: (cm) 2 Depth: (cm) 0.2 Area: (cm) 3.142 Volume: (cm) 0.628 % Reduction in Area: 66.7% % Reduction in Volume: 77.8% Epithelialization: None Tunneling: No Wound Description Classification: Grade 1 Wound Margin: Indistinct, nonvisible Exudate Amount: Medium Exudate Type: Purulent Exudate Color: yellow, brown, green Foul Odor After Cleansing: No Wound Bed Granulation Amount: Small (1-33%) Exposed Structure Granulation Quality: Red Fascia Exposed: No Necrotic Amount: Large (67-100%) Fat Layer  Exposed: No Necrotic Quality: Adherent Slough Tendon Exposed: No Muscle Exposed: No Joint Exposed: No Bone Exposed: No Limited to Skin Breakdown Periwound Skin Texture Texture Color No Abnormalities Noted: No No Abnormalities Noted: No Callus: Yes Atrophie Blanche: No Crepitus: No Cyanosis: No Urbanik, Ottie ([id number]) Excoriation: No Ecchymosis: No Fluctuance: No Erythema: No Friable: No Hemosiderin Staining: No Induration: No Mottled: No Localized Edema: No Pallor: No Rash: No Rubor: No Scarring: No Temperature / Pain Moisture Tenderness on Palpation: Yes No Abnormalities Noted: No Dry / Scaly: No Maceration: Yes Moist: Yes Wound Preparation Ulcer Cleansing: Rinsed/Irrigated with Saline Topical Anesthetic Applied: Other: lidocaine 4%, Treatment Notes Wound #1 (Right, Lateral Toe Great) 1. Cleansed with: Clean wound with Normal Saline 4. Dressing Applied: Santyl Ointment 5. Secondary Dressing Applied Gauze and Kerlix/Conform 7. Secured with Secretary/administrator) Signed: 07/06/2015 11:27:38 AM By: Elpidio Eric BSN, RN Entered By: Elpidio Eric on 07/06/2015 11:27:38 ALETHIA, MELENDREZ ([id number]) -------------------------------------------------------------------------------- Vitals Details Patient Name: [name] Spittle Date of Service: 07/06/2015 11:30 AM Medical Record Number: [id number] Patient Account Number: [id number] Date of Birth/Sex: 1941/08/01 (74 y.o. Female) Treating RN: Afful, RN, BSN, [place] Sink Primary Care Physician: Idelle Leech Other Clinician: Referring Physician: Idelle Leech Treating Physician/Extender: Elayne Snare in Treatment: 2 Vital Signs Time Taken: 11:23 Temperature (F): 98.3 Height (in): 66 Pulse (bpm): 72 Weight (lbs): 195 Respiratory Rate (breaths/min): 18 Body Mass Index (BMI): 31.5 Blood Pressure (mmHg): 116/53 Reference Range: 80 - 120 mg / dl Electronic Signature(s) Signed: 07/06/2015 11:24:18 AM By:  Elpidio Eric BSN, RN Entered By: Elpidio Eric on 07/06/2015 11:24:18

## 2015-07-06 NOTE — Progress Notes (Addendum)
[name] [name], [name] [name] ([id number]) Visit Report for 07/06/2015 Chief Complaint Document Details Patient Name: [name] [name], [name] [name] 07/06/2015 11:30 Date of Service: AM Medical Record [id number] Number: Patient Account Number: [id number] 1940-11-23 (74 y.o. Treating RN: Date of Birth/Sex: Female) Other Clinician: Primary Care Physician: [name] [name] Referring Physician: Idelle [name] Physician/Extender: [name] [name]: 2 Information Obtained from: Patient Chief Complaint Patient presents to the wound care center for a consult due non healing wound. the patient came last week to the wound care center with a ulcerated area on the medial part of her right foot which she's had for about 6 [name] now. Electronic Signature(s) Signed: 07/06/2015 11:25:09 AM By: [name] Sax MD Entered By: [name] [name] on 07/06/2015 11:25:09 [name], [name] ([id number]) -------------------------------------------------------------------------------- Debridement Details Patient Name: [name] [name], [name] [name] 07/06/2015 11:30 Date of Service: AM Medical Record [id number] Number: Patient Account Number: [id number] Sep 30, 1940 (74 y.o. Treating RN: [name] [name] Date of Birth/Sex: Female) Other Clinician: Primary Care Physician: [name] [name] Referring Physician: Idelle [name] Physician/Extender: [name] [name]: 2 Debridement Performed for Wound #1 Right,Lateral Toe Great Assessment: Performed By: Physician [name] Sax, MD Debridement: Debridement Pre-procedure Yes Verification/Time Out Taken: Start Time: 11:31 Pain Control: Lidocaine 4% Topical Solution Level: Skin/Subcutaneous Tissue Total Area Debrided (L x 2 (cm) x 2 (cm) = 4 (cm) W): Tissue and other Viable, Non-Viable, Fibrin/Slough, Subcutaneous material debrided: Instrument: Forceps, Scissors Bleeding: Minimum Hemostasis Achieved: Pressure End Time: 11:33 Procedural Pain: 0 Post Procedural  Pain: 0 Response to [name]: Procedure was tolerated well Post Debridement Measurements of Total Wound Length: (cm) 2 Width: (cm) 2 Depth: (cm) 0.2 Volume: (cm) 0.628 Post Procedure Diagnosis Same as Pre-procedure Electronic Signature(s) Signed: 07/06/2015 3:04:35 PM By: [name] Sax MD Signed: 07/06/2015 3:16:02 PM By: [name] [name] Entered By: [name] [name] on 07/06/2015 11:35:24 [name] [name] ([id number]) -------------------------------------------------------------------------------- HPI Details Patient Name: [name] [name], [name] [name] 07/06/2015 11:30 Date of Service: AM Medical Record [id number] Number: Patient Account Number: [id number] 1940/11/26 (74 y.o. Treating RN: Date of Birth/Sex: Female) Other Clinician: Primary Care Physician: [name] [name] Referring Physician: Idelle [name] Physician/Extender: [name] [name]: 2 History of Present Illness Location: wound on the medial part of her right forefoot near the first metatarsal Quality: Patient reports experiencing a dull pain to affected area(s). Severity: Patient states wound are getting worse. Duration: Patient has had the wound for < 6 [name] prior to presenting for [name] Timing: Pain in wound is Intermittent (comes and goes Context: The wound appeared gradually over time Modifying Factors: Consults to this date include: she was seen by her PCP but there is no evidence of any definite workup with x-rays. I understand she was advised IV ciprofloxacin and clindamycin but the patient refused Associated Signs and Symptoms: Patient reports having difficulty standing for long periods. HPI Description: 74 year old patient who is alert and oriented and comes to see [place] with a history of having a wound on the medial part of her right forefoot which she's had for about 6 [name]. she has a past medical history of diabetes mellitus with peripheral angiopathy without gangrene, allergic rhinitis,  hypertensive heart disease, glaucoma, chronic venous insufficiency, diverticulitis and asthma. the patient has never smoked cigarettes. I understand an x-ray may be done of the right foot and we will await this report prior to ordering further tests. Electronic Signature(s) Signed: 07/06/2015 11:25:31 AM By: [name] Sax MD Entered By: [name] [name] on 07/06/2015 11:25:31 [name] [name] ([id number]) -------------------------------------------------------------------------------- Physical Exam Details Patient Name: [name] [name], [name] [name] 07/06/2015 11:30 Date of Service:  AM Medical Record [id number] Number: Patient Account Number: [id number] 10/12/1940 (74 y.o. Treating RN: Date of Birth/Sex: Female) Other Clinician: Primary Care Physician: [name] [name] Referring Physician: Idelle [name] Physician/Extender: [name] [name]: 2 Electronic Signature(s) Signed: 07/06/2015 11:25:42 AM By: [name] Sax MD Entered By: [name] [name] on 07/06/2015 11:25:42 [name] [name] ([id number]) -------------------------------------------------------------------------------- Physician Orders Details Patient Name: [name] [name], [name] [name] 07/06/2015 11:30 Date of Service: AM Medical Record [id number] Number: Patient Account Number: [id number] 05/05/1941 (74 y.o. Treating RN: [name] [name] Date of Birth/Sex: Female) Other Clinician: Primary Care Physician: [name] [name] Referring Physician: Idelle [name] Physician/Extender: [name] [name]: 2 Verbal / Phone Orders: Yes Clinician: Curtis [name] Read Back and Verified: Yes Diagnosis Coding ICD-10 Coding Code Description E11.621 Type 2 diabetes mellitus with foot ulcer L97.513 Non-pressure chronic ulcer of other part of right foot with necrosis of muscle Wound Cleansing Wound #1 Right,Lateral Toe Great o Clean wound with Normal Saline. o No tub bath. Anesthetic Wound #1 Right,Lateral Toe  Great o Topical Lidocaine 4% cream applied to wound bed prior to debridement Primary Wound Dressing Wound #1 Right,Lateral Toe Great o Santyl Ointment Secondary Dressing Wound #1 Right,Lateral Toe Great o Dry Gauze o Boardered Foam Dressing Dressing Change Frequency Wound #1 Right,Lateral Toe Great o Change dressing every day. Follow-up Appointments Wound #1 Right,Lateral Toe Great o Return Appointment in 1 week. Additional Orders / Instructions Wound #1 [address] [name] [name], [name] [name] ([id number]) o Increase protein intake. o Activity as tolerated Medications-please add to medication list. Wound #1 Right,Lateral Toe Great o P.O. Antibiotics - continue antibiotics prescribed by outside md Electronic Signature(s) Signed: 07/06/2015 3:04:35 PM By: [name] Sax MD Signed: 07/06/2015 3:16:02 PM By: [name] [name] Entered By: [name] [name] on 07/06/2015 11:34:02 [name], [name] ([id number]) -------------------------------------------------------------------------------- Problem List Details Patient Name: [name] [name], [name] [name] 07/06/2015 11:30 Date of Service: AM Medical Record [id number] Number: Patient Account Number: [id number] 1941/08/16 (74 y.o. 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 Treating RN: Date of Birth/Sex: Female) Other Clinician: Primary Care Physician: [name] Aden, Zyen Triggs Referring Physician: Idelle [name] Physician/Extender: [name] [name]: 2 Subjective Chief Complaint Information obtained from Patient Patient presents to the wound care center for a consult due non healing wound. the patient came last week to the wound care center with a ulcerated area on the medial part of her right foot which she's had for about 6 [name] now. History of Present Illness (HPI) The following HPI elements were documented for the patient's wound: Location: wound on the medial part of her right forefoot near the first metatarsal Quality: Patient reports experiencing a dull pain to affected area(s). Severity: Patient states wound are getting worse. Duration: Patient has had the wound for < 6 [name] prior to presenting for [name] Timing: Pain in wound is Intermittent (comes and goes Context: The wound appeared gradually over time Modifying Factors: Consults to this date include: she  was seen by her PCP but there is no evidence of any definite workup with x-rays. I understand she was advised IV ciprofloxacin and clindamycin but the patient refused Associated Signs and Symptoms: Patient reports having difficulty standing for long periods. 74 year old patient who is alert and oriented and comes to see us with a history of having a wound on the medial part of her right forefoot which she's had for about 6 [name]. she has a past medical history of diabetes mellitus with peripheral angiopathy without gangrene, allergic rhinitis, hypertensive heart disease, glaucoma, chronic venous  insufficiency, diverticulitis and asthma. the patient has never smoked cigarettes. I understand an x-ray may be done of the right foot and we will await this report prior to ordering further tests. Objective Constitutional Vitals Time Taken: 11:23 AM, Height: 66 in, Weight: 195 lbs, BMI: 31.5, Temperature: 98.3 F, Pulse: 72 [name], [name] ([id number]) bpm, Respiratory Rate: 18 breaths/min, Blood Pressure: 116/53 mmHg. Assessment Active Problems ICD-10 E11.621 - Type 2 diabetes mellitus with foot ulcer L97.513 - Non-pressure chronic ulcer of other part of right foot with necrosis of muscle Debrided into subq DFU right great toe. Wag2 ulcer Electronic Signature(s) Signed: 07/06/2015 11:38:49 AM By: [name] SaxParker, Jahmia Berrett MD Entered By: [name] SaxParker, Jacobus Colvin on 07/06/2015 11:38:49 [place], [name] [name] ([id number]) -------------------------------------------------------------------------------- SuperBill Details Patient Name: Alben SpittleLLOYD, Jayleene Date of Service: 07/06/2015 Medical Record Patient Account Number: [id number] Number: Afful, RN, BSN, Treating RN: 1940-11-17 (74 y.o. [place] Date of Birth/Sex: Female) Other Clinician: Primary Care Physician: [name] AdenKWASCHYN, KATIE Treating Elliyah Liszewski Referring Physician: Idelle LeechKWASCHYN, KATIE Physician/Extender: [name] [name]: 2 Diagnosis Coding ICD-10 Codes Code Description E11.621 Type 2 diabetes mellitus with foot ulcer L97.513 Non-pressure chronic ulcer of other part of right foot with necrosis of muscle Facility Procedures CPT4: Description Modifier Quantity Code [id number] 11042 - DEB SUBQ TISSUE 20 SQ CM/< 1 ICD-10 Description Diagnosis L97.513 Non-pressure chronic ulcer of other part of right foot with necrosis of muscle Physician Procedures CPT4: Description Modifier Quantity Code [id number] 99212 - WC PHYS LEVEL 2 - EST PT 1 ICD-10 Description Diagnosis E11.621 Type 2 diabetes mellitus with foot ulcer CPT4: [id number] 11042 - WC PHYS SUBQ TISS 20 SQ CM 1  ICD-10 Description Diagnosis L97.513 Non-pressure chronic ulcer of other part of right foot with necrosis of muscle Electronic Signature(s) Signed: 07/06/2015 11:39:28 AM By: [name] SaxParker, Marcelle Hepner MD Entered By: [name] SaxParker, Demosthenes Virnig on 07/06/2015 11:39:27

## 2015-07-06 NOTE — Progress Notes (Signed)
seeiheal 

## 2015-07-12 ENCOUNTER — Encounter: Payer: Medicare (Managed Care) | Admitting: Surgery

## 2015-07-12 DIAGNOSIS — L97519 Non-pressure chronic ulcer of other part of right foot with unspecified severity: Secondary | ICD-10-CM | POA: Diagnosis not present

## 2015-07-12 NOTE — Progress Notes (Addendum)
[name], [name] ([id number]) Visit Report for 07/12/2015 Chief Complaint Document Details Patient Name: [name] [name], [name] [name] 07/12/2015 10:00 Date of Service: AM Medical Record [id number] Number: Patient Account Number: [id number] 03/18/1941 (74 y.o. Treating RN: [name] [name] Date of Birth/Sex: Female) Other Clinician: Primary Care Physician: [name] [name] Treating [name] [name] Referring Physician: Idelle [name] Physician/Extender: [name] [name] in Treatment: 3 Information Obtained from: Patient Chief Complaint Patient presents to the wound care center for a consult due non healing wound. the patient came last week to the wound care center with a ulcerated area on the medial part of her right foot which she's had for about 6 weeks now. Electronic Signature(s) Signed: 07/12/2015 10:29:24 AM By: [name] Kanner MD, [name] Entered By: [name] [name] on 07/12/2015 10:29:24 [name] [name], [name] [name] ([id number]) -------------------------------------------------------------------------------- HPI Details Patient Name: [name] [name], [name] [name] 07/12/2015 10:00 Date of Service: AM Medical Record [id number] Number: Patient Account Number: [id number] July 25, 1941 (74 y.o. Treating RN: [name] [name] Date of Birth/Sex: Female) Other Clinician: Primary Care Physician: [name] [name] Treating [name] [name] Referring Physician: Idelle [name] Physician/Extender: Weeks in Treatment: 3 History of Present Illness Location: wound on the medial part of her right forefoot near the first metatarsal Quality: Patient reports experiencing a dull pain to affected area(s). Severity: Patient states wound are getting worse. Duration: Patient has had the wound for < 6 weeks prior to presenting for treatment Timing: Pain in wound is Intermittent (comes and goes Context: The wound appeared gradually over time Modifying Factors: Consults to this date include: she was seen by her PCP but there is no evidence of any definite workup with x-rays. I  understand she was advised IV ciprofloxacin and clindamycin but the patient refused Associated Signs and Symptoms: Patient reports having difficulty standing for long periods. HPI Description: 74 year old patient who is alert and oriented and comes to see [place] with a history of having a wound on the medial part of her right forefoot which she's had for about 6 weeks. she has a past medical history of diabetes mellitus with peripheral angiopathy without gangrene, allergic rhinitis, hypertensive heart disease, glaucoma, chronic venous insufficiency, diverticulitis and asthma. the patient has never smoked cigarettes. I understand an x-ray may be done of the right foot and we will await this report prior to ordering further tests. 07/12/2015-- x-ray of the right foot done on 07/11/2015 which was compared to the one done on 06/20/2015 shows no acute fracture or dislocation on the x-ray. Electronic Signature(s) Signed: 07/12/2015 10:29:50 AM By: [name] Kanner MD, [name] Previous Signature: 07/12/2015 8:21:06 AM Version By: [name] Kanner MD, [name] Previous Signature: 07/12/2015 8:13:10 AM Version By: [name] Kanner MD, [name] Entered By: [name] [name] on 07/12/2015 10:29:50 [name], [name] ([id number]) -------------------------------------------------------------------------------- Physical Exam Details Patient Name: [name], [name] 07/12/2015 10:00 Date of Service: AM Medical Record [id number] Number: Patient Account Number: [id number] Jun 12, 1941 (74 y.o. Treating RN: [name] [name] Date of Birth/Sex: Female) Other Clinician: Primary Care Physician: [name] [name] Treating [name] [name] Referring Physician: Idelle [name] Physician/Extender: Weeks in Treatment: 3 Constitutional . Pulse regular. Respirations normal and unlabored. Afebrile. . Eyes Nonicteric. Reactive to light. Ears, Nose, Mouth, and Throat Lips, teeth, and gums WNL.Marland Kitchen Moist mucosa without lesions . Neck supple and nontender. No  palpable supraclavicular or cervical adenopathy. Normal sized without goiter. Respiratory WNL. No retractions.. Cardiovascular Pedal Pulses WNL. No clubbing, cyanosis or edema. Lymphatic No adneopathy. No adenopathy. No adenopathy. Musculoskeletal Adexa without tenderness or enlargement.. Digits and nails w/o clubbing, cyanosis, infection, petechiae, ischemia, or inflammatory conditions.. Integumentary (Hair, Skin) No suspicious lesions. No crepitus  or fluctuance. No peri-wound warmth or erythema. No masses.Marland Kitchen. Psychiatric Judgement and insight Intact.. No evidence of depression, anxiety, or agitation.. Notes the patient has an open wound on the medial part of the first metatarsal head and there is fascia exposed but does not probe down to bone. The surrounding area is quite clean. Electronic Signature(s) Signed: 07/12/2015 10:33:17 AM By: [name] KannerBritto, [name] [name] Entered By: [name] KannerBritto, Cambell Stanek on 07/12/2015 10:33:16 [name] [name], [name] [name] ([id number]) -------------------------------------------------------------------------------- Physician Orders Details Patient Name: [name] [name], [name] [name] 07/12/2015 10:00 Date of Service: AM Medical Record [id number] Number: Patient Account Number: [id number] 08/20/1941 (74 y.o. Treating RN: [name] CoventryWoody, [name] Date of Birth/Sex: Female) Other Clinician: Primary Care Physician: [name] LeechKWASCHYN, [name] Treating [name] [name] Referring Physician: Idelle LeechKWASCHYN, [name] Physician/Extender: [name] AdeWeeks in Treatment: 3 Verbal / Phone Orders: Yes Clinician: Huel CoventryWoody, [name] Read Back and Verified: Yes Diagnosis Coding Wound Cleansing Wound #1 Right,Lateral Toe Great o Cleanse wound with mild soap and water Anesthetic Wound #1 Right,Lateral Toe Great o Topical Lidocaine 4% cream applied to wound bed prior to debridement Primary Wound Dressing Wound #1 Right,Lateral Toe Great o Santyl Ointment Secondary Dressing o Conform/Kerlix Wound #1 Right,Lateral Toe Great o Non-adherent  pad Dressing Change Frequency Wound #1 Right,Lateral Toe Great o Change Dressing Monday, Wednesday, Friday Follow-up Appointments Wound #1 Right,Lateral Toe Great o Return Appointment in 1 week. Consults o Podiatry - Patient needs referral for podiatry visit to assess bones in foot. Wayland Salinasoooo Croslin, Itzayana ([id number]) Electronic Signature(s) Signed: 07/12/2015 3:59:19 PM By: [name] KannerBritto, Billye Pickerel MD, [name] Signed: 07/12/2015 6:06:11 PM By: Elliot GurneyWoody, RN, BSN, Kim RN, BSN Entered By: Elliot GurneyWoody, RN, BSN, [name] on 07/12/2015 10:29:03 [name] [name], Jamya ([id number]) -------------------------------------------------------------------------------- Problem List Details Patient Name: [name] [name], [name] [name] 07/12/2015 10:00 Date of Service: AM Medical Record [id number] Number: Patient Account Number: [id number] 08/20/1941 (74 y.o. 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 Treating RN: [name] CoventryWoody, [name] Date of Birth/Sex: Female) Other Clinician: Primary Care Physician: [name] LeechKWASCHYN, [name] Treating [name] KannerBritto, Naudia Crosley Referring Physician: Idelle LeechKWASCHYN,  [name] Physician/Extender: [name] AdeWeeks in Treatment: 3 Subjective Chief Complaint Information obtained from Patient Patient presents to the wound care center for a consult due non healing wound. the patient came last week to the wound care center with a ulcerated area on the medial part of her right foot which she's had for about 6 weeks now. History of Present Illness (HPI) The following HPI elements were documented for the patient's wound: Location: wound on the medial part of her right forefoot near the first metatarsal Quality: Patient reports experiencing a dull pain to affected area(s). Severity: Patient states wound are getting worse. Duration: Patient has had the wound for < 6 weeks prior to presenting for treatment Timing: Pain in wound is Intermittent (comes and goes Context: The wound appeared gradually over time Modifying Factors: Consults to this date include: she was seen by her PCP but there is no evidence of any definite workup with x-rays. I understand she  was advised IV ciprofloxacin and clindamycin but the patient refused Associated Signs and Symptoms: Patient reports having difficulty standing for long periods. 74 year old patient who is alert and oriented and comes to see [place] with a history of having a wound on the medial part of her right forefoot which she's had for about 6 weeks. she has a past medical history of diabetes mellitus with peripheral angiopathy without gangrene, allergic rhinitis, hypertensive heart disease, glaucoma, chronic venous insufficiency, diverticulitis and asthma. the patient has never smoked cigarettes. I understand an x-ray may be done of the right foot and we will await this report prior to ordering further tests. 07/12/2015-- x-ray of the right foot done on 07/11/2015 which was compared to the one done on 06/20/2015 shows no acute fracture or dislocation on the x-ray. Objective Masser, Daron ([id number]) Constitutional Pulse regular.  Respirations normal and unlabored. Afebrile. Vitals Time Taken: 10:15 AM, Height: 66 in, Weight: 195 lbs, BMI: 31.5, Temperature: 98.1 F, Pulse: 68 bpm, Respiratory Rate: 18 breaths/min, Blood Pressure: 124/57 mmHg. Eyes Nonicteric. Reactive to light. Ears, Nose, Mouth, and Throat Lips, teeth, and gums WNL.Marland Kitchen Moist mucosa without lesions . Neck supple and nontender. No palpable supraclavicular or cervical adenopathy. Normal sized without goiter. Respiratory WNL. No retractions.. Cardiovascular Pedal Pulses WNL. No clubbing, cyanosis or edema. Lymphatic No adneopathy. No adenopathy. No adenopathy. Musculoskeletal Adexa without tenderness or enlargement.. Digits and nails w/o clubbing, cyanosis, infection, petechiae, ischemia, or inflammatory conditions.Marland Kitchen Psychiatric Judgement and insight Intact.. No evidence of depression, anxiety, or agitation.. General Notes: the patient has an open wound on the medial part of the first metatarsal head and there is fascia exposed but does not probe down to bone. The surrounding area is quite clean. Integumentary (Hair, Skin) No suspicious lesions. No crepitus or fluctuance. No peri-wound warmth or erythema. No masses.. Wound #1 status is Open. Original cause of wound was Gradually Appeared. The wound is located on the [employer]. The wound measures 1.6cm length x 2cm width x 0.2cm depth; 2.513cm^2 area and 0.503cm^3 volume. There is fascia exposed. There is a medium amount of purulent drainage noted. The wound margin is indistinct and nonvisible. There is small (1-33%) red granulation within the wound bed. There is a large (67-100%) amount of necrotic tissue within the wound bed including Adherent Slough. The periwound skin appearance exhibited: Callus, Moist. The periwound skin appearance did not exhibit: Crepitus, Excoriation, Fluctuance, Friable, Induration, Localized Edema, Rash, Scarring, Dry/Scaly, Maceration, Atrophie Blanche,  Cyanosis, Ecchymosis, Hemosiderin Staining, Mottled, Pallor, Rubor, Erythema. The periwound has tenderness on palpation. 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 I would also recommend a podiatry consult to consider any operative correction of this area and appropriate debridement of the bone if they so deemed fit. She will come back and see me next week. Plan Wound Cleansing: Wound #1 Right,Lateral Toe Great: Cleanse wound with mild soap and water Anesthetic: Wound #1 Right,Lateral Toe Great: Topical Lidocaine 4% cream applied to wound bed prior to debridement Primary Wound Dressing: Wound #1 Right,Lateral Toe Great: Santyl Ointment Secondary Dressing: Conform/Kerlix Wound #1 Right,Lateral Toe Great: Non-adherent pad Dressing Change Frequency: Wound #1 Right,Lateral Toe Great: Change Dressing Monday, Wednesday, Friday Follow-up Appointments: Wound #1 Right,Lateral  Toe Great: Return Appointment in 1 week. Consults ordered were: Podiatry - Patient needs referral for podiatry visit to assess bones in foot. [name] [name], [name] [name] ([id number]) I have recommended we continue with Santyl ointment locally and a bordered foam and change this every other day when she goes to the [facility]. I would also recommend a podiatry consult to consider any operative correction of this area and appropriate debridement of the bone if they so deemed fit. She will come back and see me next week. Electronic Signature(s) Signed: 07/12/2015 10:34:40 AM By: [name] Kanner MD, [name] Entered By: [name] [name] on 07/12/2015 10:34:40 [name] [name], [name] [name]  ([id number]) -------------------------------------------------------------------------------- SuperBill Details Patient Name: [name] Spittle Date of Service: 07/12/2015 Medical Record Number: [id number] Patient Account Number: [id number] Date of Birth/Sex: Nov 05, 1940 (74 y.o. Female) Treating RN: [name] [name] Primary Care Physician: [name] [name] Other Clinician: Referring Physician: Idelle [name] Treating Physician/Extender: Rudene Re in Treatment: 3 Diagnosis Coding ICD-10 Codes Code Description [id number] Type 2 diabetes mellitus with foot ulcer L97.513 Non-pressure chronic ulcer of other part of right foot with necrosis of muscle Facility Procedures CPT4 Code: [id number] Description: 99213 - WOUND CARE VISIT-LEV 3 EST PT Modifier: Quantity: 1 Physician Procedures CPT4: Description Modifier Quantity Code [id number] 99213 - WC PHYS LEVEL 3 - EST PT 1 ICD-10 Description Diagnosis E11.621 Type 2 diabetes mellitus with foot ulcer L97.513 Non-pressure chronic ulcer of other part of right foot with necrosis of muscle Electronic Signature(s) Signed: 07/12/2015 3:59:19 PM By: [name] Kanner MD, [name] Signed: 07/12/2015 6:06:11 PM By: Elliot Gurney RN, BSN, Kim RN, BSN Previous Signature: 07/12/2015 10:35:06 AM Version By: [name] Kanner MD, [name] Entered By: Elliot Gurney, RN, BSN, [name] on 07/12/2015 10:36:20

## 2015-07-13 NOTE — Progress Notes (Signed)
[name] [name] ([id number]) Visit Report for 07/12/2015 Arrival Information Details Patient Name: [name] [name], [name] [name] Date of Service: 07/12/2015 10:00 AM Medical Record Number: [id number] Patient Account Number: [id number] Date of Birth/Sex: [birth year] (74 y.o. Female) Treating RN: Huel Coventry Primary Care Physician: Idelle Leech Other Clinician: Referring Physician: Idelle Leech Treating Physician/Extender: Rudene Re in Treatment: 3 Visit Information History Since Last Visit Added or deleted any medications: No Patient Arrived: Walker Any new allergies or adverse reactions: No Arrival Time: 10:15 Had a fall or experienced change in No Accompanied By: self activities of daily living that may affect Transfer Assistance: None risk of falls: Patient Identification Verified: Yes Signs or symptoms of abuse/neglect since last No Secondary Verification Process Yes visito Completed: Hospitalized since last visit: No Patient Has Alerts: Yes Has Dressing in Place as Prescribed: Yes Patient Alerts: Patient on Blood Pain Present Now: No Thinner Type II Diabetic 81mg  Aspirin Electronic Signature(s) Signed: 07/12/2015 6:06:11 PM By: Elliot Gurney, RN, BSN, Kim RN, BSN Entered By: Elliot Gurney, RN, BSN, Kim on 07/12/2015 10:15:40 Blatter, Brya ([id number]) -------------------------------------------------------------------------------- Clinic Level of Care Assessment Details Patient Name: [name] [name] Date of Service: 07/12/2015 10:00 AM Medical Record Number: [id number] Patient Account Number: [id number] Date of Birth/Sex: March 25, 1941 (74 y.o. Female) Treating RN: Huel Coventry Primary Care Physician: Idelle Leech Other Clinician: Referring Physician: Idelle Leech Treating Physician/Extender: Rudene Re in Treatment: 3 Clinic Level of Care Assessment Items TOOL 4 Quantity Score []  - Use when only an EandM is performed on FOLLOW-UP visit 0 ASSESSMENTS - Nursing Assessment  / Reassessment []  - Reassessment of Co-morbidities (includes updates in patient status) 0 X - Reassessment of Adherence to Treatment Plan 1 5 ASSESSMENTS - Wound and Skin Assessment / Reassessment X - Simple Wound Assessment / Reassessment - one wound 1 5 []  - Complex Wound Assessment / Reassessment - multiple wounds 0 []  - Dermatologic / Skin Assessment (not related to wound area) 0 ASSESSMENTS - Focused Assessment []  - Circumferential Edema Measurements - multi extremities 0 []  - Nutritional Assessment / Counseling / Intervention 0 []  - Lower Extremity Assessment (monofilament, tuning fork, pulses) 0 []  - Peripheral Arterial Disease Assessment (using hand held doppler) 0 ASSESSMENTS - Ostomy and/or Continence Assessment and Care []  - Incontinence Assessment and Management 0 []  - Ostomy Care Assessment and Management (repouching, etc.) 0 PROCESS - Coordination of Care X - Simple Patient / Family Education for ongoing care 1 15 []  - Complex (extensive) Patient / Family Education for ongoing care 0 X - Staff obtains Chiropractor, Records, Test Results / Process Orders 1 10 []  - Staff telephones HHA, Nursing Homes / Clarify orders / etc 0 []  - Routine Transfer to another Facility (non-emergent condition) 0 Tilley, Mariadelcarmen ([id number]) []  - Routine Hospital Admission (non-emergent condition) 0 []  - New Admissions / Manufacturing engineer / Ordering NPWT, Apligraf, etc. 0 []  - Emergency Hospital Admission (emergent condition) 0 X - Simple Discharge Coordination 1 10 []  - Complex (extensive) Discharge Coordination 0 PROCESS - Special Needs []  - Pediatric / Minor Patient Management 0 []  - Isolation Patient Management 0 []  - Hearing / Language / Visual special needs 0 []  - Assessment of Community assistance (transportation, D/C planning, etc.) 0 []  - Additional assistance / Altered mentation 0 []  - Support Surface(s) Assessment (bed, cushion, seat, etc.) 0 INTERVENTIONS - Wound Cleansing /  Measurement X - Simple Wound Cleansing - one wound 1 5 []  - Complex Wound Cleansing - multiple wounds 0 X - Wound Imaging (photographs - any number  of wounds) 1 5 []  - Wound Tracing (instead of photographs) 0 X - Simple Wound Measurement - one wound 1 5 []  - Complex Wound Measurement - multiple wounds 0 INTERVENTIONS - Wound Dressings X - Small Wound Dressing one or multiple wounds 1 10 []  - Medium Wound Dressing one or multiple wounds 0 []  - Large Wound Dressing one or multiple wounds 0 X - Application of Medications - topical 1 5 []  - Application of Medications - injection 0 INTERVENTIONS - Miscellaneous []  - External ear exam 0 Stegmaier, Lemon ([id number]) []  - Specimen Collection (cultures, biopsies, blood, body fluids, etc.) 0 []  - Specimen(s) / Culture(s) sent or taken to Lab for analysis 0 []  - Patient Transfer (multiple staff / Michiel Sites Lift / Similar devices) 0 []  - Simple Staple / Suture removal (25 or less) 0 []  - Complex Staple / Suture removal (26 or more) 0 []  - Hypo / Hyperglycemic Management (close monitor of Blood Glucose) 0 []  - Ankle / Brachial Index (ABI) - do not check if billed separately 0 X - Vital Signs 1 5 Has the patient been seen at the hospital within the last three years: Yes Total Score: 80 Level Of Care: New/Established - Level 3 Electronic Signature(s) Signed: 07/12/2015 6:06:11 PM By: Elliot Gurney, RN, BSN, Kim RN, BSN Entered By: Elliot Gurney, RN, BSN, Kim on 07/12/2015 10:36:08 [name] [name] ([id number]) -------------------------------------------------------------------------------- Encounter Discharge Information Details Patient Name: [name] [name] Date of Service: 07/12/2015 10:00 AM Medical Record Number: [id number] Patient Account Number: [id number] Date of Birth/Sex: May 09, 1941 (74 y.o. Female) Treating RN: Huel Coventry Primary Care Physician: Idelle Leech Other Clinician: Referring Physician: Idelle Leech Treating Physician/Extender: Rudene Re in Treatment: 3 Encounter Discharge Information Items Discharge Pain Level: 0 Discharge Condition: Stable Ambulatory Status: Walker Discharge Destination: Home Transportation: Private Auto Accompanied By: self Schedule Follow-up Appointment: Yes Medication Reconciliation completed and provided to Patient/Care Yes Aldene Hendon: Provided on Clinical Summary of Care: 07/12/2015 Form Type Recipient Paper Patient ML Electronic Signature(s) Signed: 07/12/2015 6:06:11 PM By: Elliot Gurney RN, BSN, Kim RN, BSN Previous Signature: 07/12/2015 10:35:14 AM Version By: Gwenlyn Perking Entered By: Elliot Gurney RN, BSN, Kim on 07/12/2015 10:37:06 Hinz, Sloane ([id number]) -------------------------------------------------------------------------------- Lower Extremity Assessment Details Patient Name: [name] [name] Date of Service: 07/12/2015 10:00 AM Medical Record Number: [id number] Patient Account Number: [id number] Date of Birth/Sex: May 11, 1941 (74 y.o. Female) Treating RN: Huel Coventry Primary Care Physician: Idelle Leech Other Clinician: Referring Physician: Idelle Leech Treating Physician/Extender: Rudene Re in Treatment: 3 Vascular Assessment Pulses: Posterior Tibial Dorsalis Pedis Palpable: [Right:Yes] Extremity colors, hair growth, and conditions: Extremity Color: [Right:Hyperpigmented] Hair Growth on Extremity: [Right:No] Temperature of Extremity: [Right:Warm] Capillary Refill: [Right:< 3 seconds] Toe Nail Assessment Left: Right: Thick: No Discolored: No Deformed: No Improper Length and Hygiene: No Electronic Signature(s) Signed: 07/12/2015 6:06:11 PM By: Elliot Gurney, RN, BSN, Kim RN, BSN Entered By: Elliot Gurney, RN, BSN, Kim on 07/12/2015 10:17:31 Knappenberger, Kelli ([id number]) -------------------------------------------------------------------------------- Multi Wound Chart Details Patient Name: [name] [name] Date of Service: 07/12/2015 10:00 AM Medical Record Number:  [id number] Patient Account Number: [id number] Date of Birth/Sex: 1940/09/19 (74 y.o. Female) Treating RN: Huel Coventry Primary Care Physician: Idelle Leech Other Clinician: Referring Physician: Idelle Leech Treating Physician/Extender: Rudene Re in Treatment: 3 Vital Signs Height(in): 66 Pulse(bpm): 68 Weight(lbs): 195 Blood Pressure 124/57 (mmHg): Body Mass Index(BMI): 31 Temperature(F): 98.1 Respiratory Rate 18 (breaths/min): Photos: [1:No Photos] [N/A:N/A] Wound Location: [1:Right Toe Great - Lateral N/A] Wounding Event: [1:Gradually Appeared] [N/A:N/A] Primary Etiology: [1:Diabetic Wound/Ulcer of N/A the Lower  Extremity] Comorbid History: [1:Cataracts, Glaucoma, Peripheral Venous Disease, Type II Diabetes, Osteoarthritis, Osteomyelitis, Neuropathy] [N/A:N/A] Date Acquired: [1:05/28/2015] [N/A:N/A] Weeks of Treatment: [1:3] [N/A:N/A] Wound Status: [1:Open] [N/A:N/A] Measurements L x W x D 1.6x2x0.2 [N/A:N/A] (cm) Area (cm) : [1:2.513] [N/A:N/A] Volume (cm) : [1:0.503] [N/A:N/A] % Reduction in Area: [1:73.30%] [N/A:N/A] % Reduction in Volume: 82.20% [N/A:N/A] Classification: [1:Grade 2] [N/A:N/A] Exudate Amount: [1:Medium] [N/A:N/A] Exudate Type: [1:Purulent] [N/A:N/A] Exudate Color: [1:yellow, brown, green] [N/A:N/A] Wound Margin: [1:Indistinct, nonvisible] [N/A:N/A] Granulation Amount: [1:Small (1-33%)] [N/A:N/A] Granulation Quality: [1:Red] [N/A:N/A] Necrotic Amount: [1:Large (67-100%)] [N/A:N/A] Exposed Structures: [1:Fascia: Yes Fat: No Tendon: No] [N/A:N/A] Muscle: No Joint: No Bone: No Epithelialization: None N/A N/A Periwound Skin Texture: Callus: Yes N/A N/A Edema: No Excoriation: No Induration: No Crepitus: No Fluctuance: No Friable: No Rash: No Scarring: No Periwound Skin Moist: Yes N/A N/A Moisture: Maceration: No Dry/Scaly: No Periwound Skin Color: Atrophie Blanche: No N/A N/A Cyanosis: No Ecchymosis: No Erythema:  No Hemosiderin Staining: No Mottled: No Pallor: No Rubor: No Tenderness on Yes N/A N/A Palpation: Wound Preparation: Ulcer Cleansing: N/A N/A Rinsed/Irrigated with Saline Topical Anesthetic Applied: Other: lidocaine 4% Treatment Notes Electronic Signature(s) Signed: 07/12/2015 6:06:11 PM By: Elliot Gurney, RN, BSN, Kim RN, BSN Entered By: Elliot Gurney, RN, BSN, Kim on 07/12/2015 10:26:31 SAMIRAH, SCARPATI ([id number]) -------------------------------------------------------------------------------- Multi-Disciplinary Care Plan Details Patient Name: [name] [name] Date of Service: 07/12/2015 10:00 AM Medical Record Number: [id number] Patient Account Number: [id number] Date of Birth/Sex: [birth year] (74 y.o. Female) Treating RN: Huel Coventry Primary Care Physician: Idelle Leech Other Clinician: Referring Physician: Idelle Leech Treating Physician/Extender: Rudene Re in Treatment: 3 Active Inactive Abuse / Safety / Falls / Self Care Management Nursing Diagnoses: Impaired physical mobility Potential for falls Goals: Patient will remain injury free Date Initiated: 06/21/2015 Goal Status: Active Interventions: Assess fall risk on admission and as needed Notes: Nutrition Nursing Diagnoses: Impaired glucose control: actual or potential Goals: Patient/caregiver verbalizes understanding of need to maintain therapeutic glucose control per primary care physician Date Initiated: 06/21/2015 Goal Status: Active Interventions: Provide education on elevated blood sugars and impact on wound healing Notes: Orientation to the Wound Care Program Nursing Diagnoses: Knowledge deficit related to the wound healing center program GoalsWYVONNE, CARDA ([id number]) Patient/caregiver will verbalize understanding of the Wound Healing Center Program Date Initiated: 06/21/2015 Goal Status: Active Interventions: Provide education on orientation to the wound center Notes: Soft Tissue  Infection Nursing Diagnoses: Impaired tissue integrity Potential for infection: soft tissue Goals: Patient's soft tissue infection will resolve Date Initiated: 06/21/2015 Goal Status: Active Interventions: Assess signs and symptoms of infection every visit Treatment Activities: Culture and sensitivity : 07/12/2015 Systemic antibiotics : 07/12/2015 Notes: Wound/Skin Impairment Nursing Diagnoses: Impaired tissue integrity Goals: Ulcer/skin breakdown will heal within 14 weeks Date Initiated: 06/21/2015 Goal Status: Active Interventions: Assess ulceration(s) every visit Treatment Activities: Topical wound management initiated : 07/12/2015 Notes: EMMER, LILLIBRIDGE ([id number]) Electronic Signature(s) Signed: 07/12/2015 6:06:11 PM By: Elliot Gurney, RN, BSN, Kim RN, BSN Entered By: Elliot Gurney, RN, BSN, Kim on 07/12/2015 10:26:18 [name] [name] ([id number]) -------------------------------------------------------------------------------- Pain Assessment Details Patient Name: [name] [name] Date of Service: 07/12/2015 10:00 AM Medical Record Number: [id number] Patient Account Number: [id number] Date of Birth/Sex: 24-Dec-1940 (74 y.o. Female) Treating RN: Huel Coventry Primary Care Physician: Idelle Leech Other Clinician: Referring Physician: Idelle Leech Treating Physician/Extender: Rudene Re in Treatment: 3 Active Problems Location of Pain Severity and Description of Pain Patient Has Paino No Site Locations Pain Management and Medication Current Pain Management: Electronic Signature(s) Signed: 07/12/2015 6:06:11 PM By: Elliot Gurney, RN,  BSN, Kim RN, BSN Entered By: Elliot GurneyWoody, RN, BSN, Kim on 07/12/2015 10:15:47 [name] SpittleLLOYD, Kaiana ([id number]) -------------------------------------------------------------------------------- Patient/Caregiver Education Details Patient Name: [name] SpittleLLOYD, Ziona Date of Service: 07/12/2015 10:00 AM Medical Record Number: [id number] Patient Account Number:  [id number] Date of Birth/Gender: 1941-02-16 (74 y.o. Female) Treating RN: Huel CoventryWoody, Kim Primary Care Physician: Idelle LeechKWASCHYN, KATIE Other Clinician: Referring Physician: Idelle LeechKWASCHYN, KATIE Treating Physician/Extender: Rudene ReBritto, Errol Weeks in Treatment: 3 Education Assessment Education Provided To: Patient Education Topics Provided Welcome To The [facility]: Handouts: Welcome To The [facility] Methods: Demonstration Wound/Skin Impairment: Handouts: Caring for Your Ulcer, Other: continue wound care as ordered Methods: Demonstration, Explain/Verbal Responses: State content correctly Electronic Signature(s) Signed: 07/12/2015 6:06:11 PM By: Elliot GurneyWoody, RN, BSN, Kim RN, BSN Entered By: Elliot GurneyWoody, RN, BSN, Kim on 07/12/2015 10:37:32 Peachey, Amirrah ([id number]) -------------------------------------------------------------------------------- Wound Assessment Details Patient Name: [name] SpittleLLOYD, Tianne Date of Service: 07/12/2015 10:00 AM Medical Record Number: [id number] Patient Account Number: [id number] Date of Birth/Sex: 1941-02-16 (74 y.o. Female) Treating RN: Huel CoventryWoody, Kim Primary Care Physician: Idelle LeechKWASCHYN, KATIE Other Clinician: Referring Physician: Idelle LeechKWASCHYN, KATIE Treating Physician/Extender: Rudene ReBritto, Errol Weeks in Treatment: 3 Wound Status Wound Number: 1 Primary Diabetic Wound/Ulcer of the Lower Etiology: Extremity Wound Location: Right Toe Great - Lateral Wound Open Wounding Event: Gradually Appeared Status: Date Acquired: 05/28/2015 Comorbid Cataracts, Glaucoma, Peripheral Weeks Of Treatment: 3 History: Venous Disease, Type II Diabetes, Clustered Wound: No Osteoarthritis, Osteomyelitis, Neuropathy Photos Photo Uploaded By: Elliot GurneyWoody, RN, BSN, Kim on 07/12/2015 18:04:19 Wound Measurements Length: (cm) 1.6 Width: (cm) 2 Depth: (cm) 0.2 Area: (cm) 2.513 Volume: (cm) 0.503 % Reduction in Area: 73.3% % Reduction in Volume: 82.2% Epithelialization: None Wound  Description Classification: Grade 2 Wound Margin: Indistinct, nonvisible Exudate Amount: Medium Exudate Type: Purulent Exudate Color: yellow, brown, green Foul Odor After Cleansing: No Wound Bed Granulation Amount: Small (1-33%) Exposed Structure Granulation Quality: Red Fascia Exposed: Yes Necrotic Amount: Large (67-100%) Fat Layer Exposed: No Grange, Takia ([id number]) Necrotic Quality: Adherent Slough Tendon Exposed: No Muscle Exposed: No Joint Exposed: No Bone Exposed: No Periwound Skin Texture Texture Color No Abnormalities Noted: No No Abnormalities Noted: No Callus: Yes Atrophie Blanche: No Crepitus: No Cyanosis: No Excoriation: No Ecchymosis: No Fluctuance: No Erythema: No Friable: No Hemosiderin Staining: No Induration: No Mottled: No Localized Edema: No Pallor: No Rash: No Rubor: No Scarring: No Temperature / Pain Moisture Tenderness on Palpation: Yes No Abnormalities Noted: No Dry / Scaly: No Maceration: No Moist: Yes Wound Preparation Ulcer Cleansing: Rinsed/Irrigated with Saline Topical Anesthetic Applied: Other: lidocaine 4%, Treatment Notes Wound #1 (Right, Lateral Toe Great) 1. Cleansed with: Clean wound with Normal Saline 2. Anesthetic Topical Lidocaine 4% cream to wound bed prior to debridement 4. Dressing Applied: Santyl Ointment 5. Secondary Dressing Applied Kerlix/Conform Non-Adherent pad 7. Secured with Magazine features editoraper tape Electronic Signature(s) Signed: 07/12/2015 6:06:11 PM By: Elliot GurneyWoody, RN, BSN, Kim RN, BSN Entered By: Elliot GurneyWoody, RN, BSN, Kim on 07/12/2015 10:26:06 [name] SpittleLLOYD, Loralee ([id number]) -------------------------------------------------------------------------------- Vitals Details Patient Name: [name] SpittleLLOYD, Orlene Date of Service: 07/12/2015 10:00 AM Medical Record Number: [id number] Patient Account Number: [id number] Date of Birth/Sex: 1941-02-16 (74 y.o. Female) Treating RN: Huel CoventryWoody, Kim Primary Care Physician: Idelle LeechKWASCHYN, KATIE Other  Clinician: Referring Physician: Idelle LeechKWASCHYN, KATIE Treating Physician/Extender: Rudene ReBritto, Errol Weeks in Treatment: 3 Vital Signs Time Taken: 10:15 Temperature (F): 98.1 Height (in): 66 Pulse (bpm): 68 Weight (lbs): 195 Respiratory Rate (breaths/min): 18 Body Mass Index (BMI): 31.5 Blood Pressure (mmHg): 124/57 Reference Range: 80 - 120 mg / dl Electronic Signature(s) Signed: 07/12/2015 6:06:11 PM By: Elliot GurneyWoody, RN, BSN,  Selena Batten RN, BSN Entered By: Elliot Gurney, RN, BSN, Kim on 07/12/2015 10:16:12

## 2015-07-27 ENCOUNTER — Encounter: Payer: Medicare (Managed Care) | Attending: Surgery | Admitting: Surgery

## 2015-07-27 DIAGNOSIS — M1990 Unspecified osteoarthritis, unspecified site: Secondary | ICD-10-CM | POA: Diagnosis not present

## 2015-07-27 DIAGNOSIS — E079 Disorder of thyroid, unspecified: Secondary | ICD-10-CM | POA: Insufficient documentation

## 2015-07-27 DIAGNOSIS — E1140 Type 2 diabetes mellitus with diabetic neuropathy, unspecified: Secondary | ICD-10-CM | POA: Diagnosis not present

## 2015-07-27 DIAGNOSIS — L97513 Non-pressure chronic ulcer of other part of right foot with necrosis of muscle: Secondary | ICD-10-CM | POA: Insufficient documentation

## 2015-07-27 DIAGNOSIS — E11621 Type 2 diabetes mellitus with foot ulcer: Secondary | ICD-10-CM | POA: Insufficient documentation

## 2015-07-28 NOTE — Progress Notes (Signed)
KYLEI, PURINGTON ([id number]) Visit Report for 07/27/2015 Chief Complaint Document Details Patient Name: [name] [name], [name] [name] 07/27/2015 10:00 Date of Service: AM Medical Record [id number] Number: Patient Account Number: [id number] [birth year] (74 y.o. Treating RN: Clover Mealy, RN, BSN, [place] Sink Date of Birth/Sex: Female) Other Clinician: Primary Care Physician: Monia Sabal Referring Physician: Idelle Leech Physician/Extender: Weeks in Treatment: 5 Information Obtained from: Patient Chief Complaint Patient presents to the wound care center for a consult due non healing wound. the patient came last week to the wound care center with a ulcerated area on the medial part of her right foot which she's had for about 6 weeks now. Electronic Signature(s) Signed: 07/27/2015 10:19:32 AM By: [name] MD, FACS Entered By: [name] on 07/27/2015 10:19:32 ANAYI, BRICCO ([id number]) -------------------------------------------------------------------------------- Debridement Details Patient Name: [name], [name] 07/27/2015 10:00 Date of Service: AM Medical Record [id number] Number: Patient Account Number: [id number] Mar 17, 1941 (74 y.o. Treating RN: Afful, RN, BSN, [place] Sink Date of Birth/Sex: Female) Other Clinician: Primary Care Physician: Idelle Leech Treating Kalayna Noy Referring Physician: Idelle Leech Physician/Extender: Weeks in Treatment: 5 Debridement Performed for Wound #1 Right,Lateral Toe Great Assessment: Performed By: Physician [name], MD Debridement: Debridement Pre-procedure Yes Verification/Time Out Taken: Start Time: 10:12 Pain Control: Lidocaine 4% Topical Solution Level: Skin/Subcutaneous Tissue Total Area Debrided (L x 2 (cm) x 1.4 (cm) = 2.8 (cm) W): Tissue and other Callus, Exudate, Fibrin/Slough, Subcutaneous material debrided: Instrument: Curette Bleeding: Minimum Hemostasis Achieved: Pressure End Time: 10:17 Procedural  Pain: 0 Post Procedural Pain: 0 Response to Treatment: Procedure was tolerated well Post Debridement Measurements of Total Wound Length: (cm) 2 Width: (cm) 1.4 Depth: (cm) 0.2 Volume: (cm) 0.44 Post Procedure Diagnosis Same as Pre-procedure Electronic Signature(s) Signed: 07/27/2015 10:19:26 AM By: [name] MD, FACS Signed: 07/27/2015 3:49:34 PM By: Elpidio Eric BSN, RN Previous Signature: 07/27/2015 10:14:08 AM Version By: Elpidio Eric BSN, RN Entered By: [name] on 07/27/2015 10:19:26 SHERRONDA, SWEIGERT ([id number]) ANGELYN, OSTERBERG ([id number]) -------------------------------------------------------------------------------- HPI Details Patient Name: [name], [name] 07/27/2015 10:00 Date of Service: AM Medical Record [id number] Number: Patient Account Number: [id number] [birth year] (74 y.o. Treating RN: Clover Mealy, RN, BSN, [place] Sink Date of Birth/Sex: Female) Other Clinician: Primary Care Physician: Monia Sabal Referring Physician: Idelle Leech Physician/Extender: Weeks in Treatment: 5 History of Present Illness Location: wound on the medial part of her right forefoot near the first metatarsal Quality: Patient reports experiencing a dull pain to affected area(s). Severity: Patient states wound are getting worse. Duration: Patient has had the wound for < 6 weeks prior to presenting for treatment Timing: Pain in wound is Intermittent (comes and goes Context: The wound appeared gradually over time Modifying Factors: Consults to this date include: she was seen by her PCP but there is no evidence of any definite workup with x-rays. I understand she was advised IV ciprofloxacin and clindamycin but the patient refused Associated Signs and Symptoms: Patient reports having difficulty standing for long periods. HPI Description: 74 year old patient who is alert and oriented and comes to see [place] with a history of having a wound on the medial part of her right  forefoot which she's had for about 6 weeks. she has a past medical history of diabetes mellitus with peripheral angiopathy without gangrene, allergic rhinitis, hypertensive heart disease, glaucoma, chronic venous insufficiency, diverticulitis and asthma. the patient has never smoked cigarettes. I understand an x-ray may be done of the right foot and we will await this report prior to ordering further tests. 07/12/2015-- x-ray of the right foot  done on 07/11/2015 which was compared to the one done on 06/20/2015 shows no acute fracture or dislocation on the x-ray. 07/27/2015 -- the patient has not been here to see [place] for 2 weeks and in the meanwhile she has not yet seen a podiatrist. Electronic Signature(s) Signed: 07/27/2015 10:20:05 AM By: [name] MD, FACS Entered By: [name] on 07/27/2015 10:20:05 LALANA, WACHTER ([id number]) -------------------------------------------------------------------------------- Physical Exam Details Patient Name: [name], [name] 07/27/2015 10:00 Date of Service: AM Medical Record [id number] Number: Patient Account Number: [id number] September 18, 1940 (74 y.o. Treating RN: Clover Mealy, RN, BSN, [place] [facility] Date of Birth/Sex: Female) Other Clinician: Primary Care Physician: Monia Sabal Referring Physician: Idelle Leech Physician/Extender: Weeks in Treatment: 5 Constitutional . Pulse regular. Respirations normal and unlabored. Afebrile. . Eyes Nonicteric. Reactive to light. Ears, Nose, Mouth, and Throat Lips, teeth, and gums WNL.Marland Kitchen Moist mucosa without lesions . Neck supple and nontender. No palpable supraclavicular or cervical adenopathy. Normal sized without goiter. Respiratory WNL. No retractions.. Cardiovascular Pedal Pulses WNL. No clubbing, cyanosis or edema. Chest Breasts symmetical and no nipple discharge.. Breast tissue WNL, no masses, lumps, or tenderness.. Lymphatic No adneopathy. No adenopathy. No  adenopathy. Musculoskeletal Adexa without tenderness or enlargement.. Digits and nails w/o clubbing, cyanosis, infection, petechiae, ischemia, or inflammatory conditions.. Integumentary (Hair, Skin) No suspicious lesions. No crepitus or fluctuance. No peri-wound warmth or erythema. No masses.Marland Kitchen Psychiatric Judgement and insight Intact.. No evidence of depression, anxiety, or agitation.. Notes the wound on the medial part of the right foot first metatarsal head has quite a bit of slough at the base and a lot of maceration of the skin around it. I have had to sharply debride this with a curette. Electronic Signature(s) Signed: 07/27/2015 10:20:50 AM By: [name] MD, FACS Entered By: [name] on 07/27/2015 10:20:50 HAVEN, PYLANT ([id number]) -------------------------------------------------------------------------------- Physician Orders Details Patient Name: BRETTA, FEES 07/27/2015 10:00 Date of Service: AM Medical Record [id number] Number: Patient Account Number: [id number] 1941/05/08 (74 y.o. Treating RN: Clover Mealy, RN, BSN, [place] Sink Date of Birth/Sex: Female) Other Clinician: Primary Care Physician: Idelle Leech Treating Javon Hupfer Referring Physician: Idelle Leech Physician/Extender: Weeks in Treatment: 5 Verbal / Phone Orders: Yes Clinician: Afful, RN, BSN, Rita Read Back and Verified: Yes Diagnosis Coding Wound Cleansing Wound #1 Right,Lateral Toe Great o Cleanse wound with mild soap and water Anesthetic Wound #1 Right,Lateral Toe Great o Topical Lidocaine 4% cream applied to wound bed prior to debridement Skin Barriers/Peri-Wound Care Wound #1 Right,Lateral Toe Great o Skin Prep Primary Wound Dressing Wound #1 Right,Lateral Toe Great o Santyl Ointment - apply just enough to wound bed only, not on healthy skin. Secondary Dressing o Conform/Kerlix Wound #1 Right,Lateral Toe Great o Gauze and Kerlix/Conform Dressing Change Frequency Wound #1  Right,Lateral Toe Great o Change Dressing Monday, Wednesday, Friday Follow-up Appointments Wound #1 Right,Lateral Toe Great o Return Appointment in 1 week. Consults o Podiatry - [facility] Please set patient up with Podiatry to assess bones on the foot. SHAKEMIA, MADERA ([id number]) oooo Electronic Signature(s) Signed: 07/27/2015 10:18:12 AM By: Elpidio Eric BSN, RN Signed: 07/27/2015 4:13:02 PM By: [name] MD, FACS Previous Signature: 07/27/2015 10:17:16 AM Version By: Elpidio Eric BSN, RN Previous Signature: 07/27/2015 10:16:56 AM Version By: Elpidio Eric BSN, RN Entered By: Elpidio Eric on 07/27/2015 10:18:11 Keilin, [place] Juliene ([id number]) -------------------------------------------------------------------------------- Problem List Details Patient Name: ELORA, WOLTER 07/27/2015 10:00 Date of Service: AM Medical Record [id number] Number: Patient Account Number: [id number] 09/24/1940 (74 y.o. 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 Treating RN: Clover Mealy, RN, BSN, [place] Sink Date of Birth/Sex: Female) Other  Clinician: Primary Care Physician: Monia Sabal Referring Physician: Idelle Leech Physician/Extender: Weeks in Treatment: 5 Subjective Chief Complaint Information obtained from Patient Patient presents to the wound care center for a consult due non healing wound. the patient came last week to the wound care center with a ulcerated area on the medial part of her right foot which she's had for about 6 weeks now. History of Present Illness (HPI) The following HPI elements were documented for the patient's wound: Location: wound on the medial part of her right forefoot near the first metatarsal Quality: Patient reports experiencing a dull pain to affected area(s). Severity: Patient states wound are getting worse. Duration: Patient has had the wound for < 6 weeks prior to presenting for treatment Timing: Pain in wound is Intermittent (comes and goes Context: The wound appeared gradually over time Modifying Factors: Consults to this date include: she was seen by her PCP but there is no evidence of any definite workup with x-rays. I understand she was advised IV ciprofloxacin and clindamycin but the patient refused Associated Signs and Symptoms: Patient reports having difficulty standing for long periods. 74 year old patient who is alert and oriented and comes to see [place] with a history of having a wound on the medial part of her right forefoot which she's had for about 6 weeks. she has a past medical history of diabetes mellitus with peripheral angiopathy without gangrene, allergic rhinitis, hypertensive heart disease, glaucoma, chronic venous insufficiency, diverticulitis and asthma. the patient has never smoked cigarettes. I understand an x-ray may be done of the right foot and we will await this report prior to ordering further tests. 07/12/2015-- x-ray of the right foot done on 07/11/2015 which was compared to the one done on 06/20/2015 shows no acute fracture  or dislocation on the x-ray. 07/27/2015 -- the patient has not been here to see [place] for 2 weeks and in the meanwhile she has not yet seen a podiatrist. Davanzo, Korrine ([id number]) Objective Constitutional Pulse regular. Respirations normal and unlabored. Afebrile. Vitals Time Taken: 10:00 AM, Height: 66 in, Weight: 195 lbs, BMI: 31.5, Temperature: 98.4 F, Pulse: 78 bpm, Respiratory Rate: 18 breaths/min, Blood Pressure: 146/57 mmHg. Eyes Nonicteric. Reactive to light. Ears, Nose, Mouth, and Throat Lips, teeth, and gums WNL.Marland Kitchen Moist mucosa without lesions . Neck supple and nontender. No palpable supraclavicular or cervical adenopathy. Normal sized without goiter. Respiratory WNL. No retractions.. Cardiovascular Pedal Pulses WNL. No clubbing, cyanosis or edema. Chest Breasts symmetical and no nipple discharge.. Breast tissue WNL, no masses, lumps, or tenderness.. Lymphatic No adneopathy. No adenopathy. No adenopathy. Musculoskeletal Adexa without tenderness or enlargement.. Digits and nails w/o clubbing, cyanosis, infection, petechiae, ischemia, or inflammatory conditions.Marland Kitchen Psychiatric Judgement and insight Intact.. No evidence of depression, anxiety, or agitation.. General Notes: the wound on the medial part of the right  foot first metatarsal head has quite a bit of slough at the base and a lot of maceration of the skin around it. I have had to sharply debride this with a curette. Integumentary (Hair, Skin) No suspicious lesions. No crepitus or fluctuance. No peri-wound warmth or erythema. No masses.. Wound #1 status is Open. Original cause of wound was Gradually Appeared. The wound is located on the [employer]. The wound measures 2cm length x 1.4cm width x 0.2cm depth; 2.199cm^2 area and 0.44cm^3 volume. There is fascia exposed. There is no tunneling or undermining noted. There is a Elmore, Gissell ([id number]) medium amount of purulent drainage noted. The wound margin is  indistinct and nonvisible. There is small (1-33%) red granulation within the wound bed. There is a large (67-100%) amount of necrotic tissue within the wound bed including Adherent Slough. The periwound skin appearance exhibited: Callus, Maceration, Moist. The periwound skin appearance did not exhibit: Crepitus, Excoriation, Fluctuance, Friable, Induration, Localized Edema, Rash, Scarring, Dry/Scaly, Atrophie Blanche, Cyanosis, Ecchymosis, Hemosiderin Staining, Mottled, Pallor, Rubor, Erythema. The periwound has tenderness on palpation. 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 strict instructions for the amount of Santyl ointment to be used has been discussed. I would also recommend a podiatry consult to consider any operative correction of this area and appropriate debridement of the bone if they so deemed fit. She will come back and see me next week. Procedures Wound #1 Wound #1 is a Diabetic Wound/Ulcer of the Lower Extremity located on the Right,Lateral Toe Great . There was a Skin/Subcutaneous Tissue Debridement ([id number]) debridement with total area of 2.8 sq cm performed by [name], MD. with the following instrument(s): Curette including Exudate, Fibrin/Slough, Callus, and Subcutaneous after achieving pain control using Lidocaine 4% Topical Solution. A time out was conducted prior to the start of the procedure. A Minimum amount of bleeding was controlled with Pressure. The procedure was tolerated well with a pain level of 0 throughout and a pain level of 0 following the procedure. Post Debridement Measurements: 2cm length x 1.4cm width x 0.2cm depth; 0.44cm^3 volume. Post procedure Diagnosis Wound #1:  Same as Pre-Procedure Craghead, Tanaya ([id number]) Plan Wound Cleansing: Wound #1 Right,Lateral Toe Great: Cleanse wound with mild soap and water Anesthetic: Wound #1 Right,Lateral Toe Great: Topical Lidocaine 4% cream applied to wound bed prior to debridement Skin Barriers/Peri-Wound Care: Wound #1 Right,Lateral Toe Great: Skin Prep Primary Wound Dressing: Wound #1 Right,Lateral Toe Great: Santyl Ointment - apply just enough to wound bed only, not on healthy skin. Secondary Dressing: Conform/Kerlix Wound #1 Right,Lateral Toe Great: Gauze and Kerlix/Conform Dressing Change Frequency: Wound #1 Right,Lateral Toe Great: Change Dressing Monday, Wednesday, Friday Follow-up Appointments: Wound #1 Right,Lateral Toe Great: Return Appointment in 1 week. Consults ordered were: Podiatry - [facility] Please set patient up with Podiatry to assess bones on the foot. I have recommended we continue with Santyl ointment locally and a bordered foam and change this every other day when she goes to the [facility] center. strict instructions for the amount of Santyl ointment to be used has been discussed. I would also recommend a podiatry consult to consider any operative correction of this area and appropriate debridement of the bone if they so deemed fit. She  will come back and see me next week. Electronic Signature(s) Signed: 07/27/2015 10:21:34 AM By: Evlyn KannerBritto, Annastyn Silvey MD, FACS LaurelLLOYD, Eron ([id number]) Entered By: Evlyn KannerBritto, Arnez Stoneking on 07/27/2015 10:21:34 Alben SpittleLLOYD, Marianne ([id number]) -------------------------------------------------------------------------------- SuperBill Details Patient Name: Alben SpittleLLOYD, Ileah Date of Service: 07/27/2015 Medical Record Patient Account Number: [id number] Number: Afful, RN, BSN, Treating RN: March 07, 1941 (74 y.o. [place] Date of Birth/Sex: Female) Other Clinician: Primary Care Physician: Idelle LeechKWASCHYN, KATIE Treating Nicle Connole Referring Physician: Idelle LeechKWASCHYN,  KATIE Physician/Extender: Weeks in Treatment: 5 Diagnosis Coding ICD-10 Codes Code Description E11.621 Type 2 diabetes mellitus with foot ulcer L97.513 Non-pressure chronic ulcer of other part of right foot with necrosis of muscle Facility Procedures CPT4: Description Modifier Quantity Code [id number] 11042 - DEB SUBQ TISSUE 20 SQ CM/< 1 ICD-10 Description Diagnosis E11.621 Type 2 diabetes mellitus with foot ulcer L97.513 Non-pressure chronic ulcer of other part of right foot with necrosis of muscle Physician Procedures CPT4: Description Modifier Quantity Code [id number] 11042 - WC PHYS SUBQ TISS 20 SQ CM 1 ICD-10 Description Diagnosis E11.621 Type 2 diabetes mellitus with foot ulcer L97.513 Non-pressure chronic ulcer of other part of right foot with necrosis of muscle Electronic Signature(s) Signed: 07/27/2015 10:21:45 AM By: Evlyn KannerBritto, Erleen Egner MD, FACS Entered By: Evlyn KannerBritto, Adore Kithcart on 07/27/2015 10:21:45

## 2015-07-28 NOTE — Progress Notes (Signed)
[name] [name], Truly ([id number]) Visit Report for 07/27/2015 Arrival Information Details Patient Name: [name] [name], [name] [name] Date of Service: 07/27/2015 10:00 AM Medical Record Number: [id number] Patient Account Number: [id number] Date of Birth/Sex: September 17, 1940 (74 y.o. Female) Treating RN: Afful, RN, BSN, [place] S[place] Primary Care Physician: Idelle LeechKWASCHYN, KATIE Other Clinician: Referring Physician: Idelle LeechKWASCHYN, KATIE Treating Physician/Extender: Rudene ReBritto, Errol Weeks in Treatment: 5 Visit Information History Since Last Visit Added or deleted any medications: No Patient Arrived: Walker Any new allergies or adverse reactions: No Arrival Time: 09:57 Had a fall or experienced change in No Accompanied By: self activities of daily living that may affect Transfer Assistance: None risk of falls: Patient Identification Verified: Yes Signs or symptoms of abuse/neglect since last No Secondary Verification Process Yes visito Completed: Has Dressing in Place as Prescribed: Yes Patient Has Alerts: Yes Pain Present Now: No Patient Alerts: Patient on Blood Thinner Type II Diabetic 81mg  Aspirin Electronic Signature(s) Signed: 07/27/2015 9:58:09 AM By: Elpidio EricAfful, Rita BSN, RN Entered By: Elpidio EricAfful, Rita on 07/27/2015 09:58:09 Hirschi, Shanetra ([id number]) -------------------------------------------------------------------------------- Encounter Discharge Information Details Patient Name: [name] [name], [name] Date of Service: 07/27/2015 10:00 AM Medical Record Number: [id number] Patient Account Number: [id number] Date of Birth/Sex: September 17, 1940 (74 y.o. Female) Treating RN: Afful, RN, BSN, [place] Sinkita Primary Care Physician: Idelle LeechKWASCHYN, KATIE Other Clinician: Referring Physician: Idelle LeechKWASCHYN, KATIE Treating Physician/Extender: Rudene ReBritto, Errol Weeks in Treatment: 5 Encounter Discharge Information Items Discharge Pain Level: 0 Discharge Condition: Stable Ambulatory Status: Walker Discharge Destination: Home Transportation: Other Accompanied By:  SELF Schedule Follow-up Appointment: No Medication Reconciliation completed and provided to Patient/Care No Samaa Ueda: Provided on Clinical Summary of Care: 07/27/2015 Form Type Recipient Paper Patient ML Electronic Signature(s) Signed: 07/27/2015 10:25:59 AM By: Elpidio EricAfful, Rita BSN, RN Previous Signature: 07/27/2015 10:25:23 AM Version By: Gwenlyn PerkingMoore, Shelia Entered By: Elpidio EricAfful, Rita on 07/27/2015 10:25:59 Barnhard, Haydn ([id number]) -------------------------------------------------------------------------------- Lower Extremity Assessment Details Patient Name: [name] [name], [name] [name] Date of Service: 07/27/2015 10:00 AM Medical Record Number: [id number] Patient Account Number: [id number] Date of Birth/Sex: September 17, 1940 (74 y.o. Female) Treating RN: Afful, RN, BSN, [place] Sinkita Primary Care Physician: Idelle LeechKWASCHYN, KATIE Other Clinician: Referring Physician: Idelle LeechKWASCHYN, KATIE Treating Physician/Extender: Rudene ReBritto, Errol Weeks in Treatment: 5 Vascular Assessment Pulses: Posterior Tibial Dorsalis Pedis Palpable: [Right:Yes] Extremity colors, hair growth, and conditions: Extremity Color: [Right:Hyperpigmented] Hair Growth on Extremity: [Right:No] Temperature of Extremity: [Right:Warm] Capillary Refill: [Right:< 3 seconds] Toe Nail Assessment Left: Right: Thick: Yes Discolored: Yes Deformed: Yes Improper Length and Hygiene: Yes Electronic Signature(s) Signed: 07/27/2015 9:58:50 AM By: Elpidio EricAfful, Rita BSN, RN Entered By: Elpidio EricAfful, Rita on 07/27/2015 09:58:50 Moncus, Cynthya ([id number]) -------------------------------------------------------------------------------- Multi Wound Chart Details Patient Name: [name] [name], [place] Date of Service: 07/27/2015 10:00 AM Medical Record Number: [id number] Patient Account Number: [id number] Date of Birth/Sex: September 17, 1940 (74 y.o. Female) Treating RN: Clover MealyAfful, RN, BSN, [place] Sinkita Primary Care Physician: Idelle LeechKWASCHYN, KATIE Other Clinician: Referring Physician: Idelle LeechKWASCHYN, KATIE Treating  Physician/Extender: Rudene ReBritto, Errol Weeks in Treatment: 5 Vital Signs Height(in): 66 Pulse(bpm): 78 Weight(lbs): 195 Blood Pressure 146/57 (mmHg): Body Mass Index(BMI): 31 Temperature(F): 98.4 Respiratory Rate 18 (breaths/min): Photos: [1:No Photos] [N/A:N/A] Wound Location: [1:Right Toe Great - Lateral N/A] Wounding Event: [1:Gradually Appeared] [N/A:N/A] Primary Etiology: [1:Diabetic Wound/Ulcer of N/A the Lower Extremity] Comorbid History: [1:Cataracts, Glaucoma, Peripheral Venous Disease, Type II Diabetes, Osteoarthritis, Osteomyelitis, Neuropathy] [N/A:N/A] Date Acquired: [1:05/28/2015] [N/A:N/A] Weeks of Treatment: [1:5] [N/A:N/A] Wound Status: [1:Open] [N/A:N/A] Measurements L x W x D 2x1.4x0.2 [N/A:N/A] (cm) Area (cm) : [1:2.199] [N/A:N/A] Volume (cm) : [1:0.44] [N/A:N/A] % Reduction in Area: [1:76.70%] [N/A:N/A] % Reduction in Volume: 84.40% [N/A:N/A] Classification: [1:Grade 2] [N/A:N/A] Exudate Amount: [  1:Medium] [N/A:N/A] Exudate Type: [1:Purulent] [N/A:N/A] Exudate Color: [1:yellow, brown, green] [N/A:N/A] Foul Odor After [1:Yes] [N/A:N/A] Cleansing: Odor Anticipated Due to No [N/A:N/A] Product Use: Wound Margin: [1:Indistinct, nonvisible] [N/A:N/A] Granulation Amount: [1:Small (1-33%)] [N/A:N/A] Granulation Quality: [1:Red] [N/A:N/A] Necrotic Amount: Large (67-100%) N/A N/A Exposed Structures: Fascia: Yes N/A N/A Fat: No Tendon: No Muscle: No Joint: No Bone: No Epithelialization: None N/A N/A Periwound Skin Texture: Callus: Yes N/A N/A Edema: No Excoriation: No Induration: No Crepitus: No Fluctuance: No Friable: No Rash: No Scarring: No Periwound Skin Maceration: Yes N/A N/A Moisture: Moist: Yes Dry/Scaly: No Periwound Skin Color: Atrophie Blanche: No N/A N/A Cyanosis: No Ecchymosis: No Erythema: No Hemosiderin Staining: No Mottled: No Pallor: No Rubor: No Tenderness on Yes N/A N/A Palpation: Wound Preparation: Ulcer Cleansing:  N/A N/A Rinsed/Irrigated with Saline Topical Anesthetic Applied: Other: lidocaine 4% Treatment Notes Electronic Signature(s) Signed: 07/27/2015 10:10:55 AM By: Elpidio Eric BSN, RN Entered By: Elpidio Eric on 07/27/2015 10:10:54 CASSANDA, WALMER ([id number]) -------------------------------------------------------------------------------- Multi-Disciplinary Care Plan Details Patient Name: [name] [name] Date of Service: 07/27/2015 10:00 AM Medical Record Number: [id number] Patient Account Number: [id number] Date of Birth/Sex: May 03, 1941 (74 y.o. Female) Treating RN: Afful, RN, BSN, [place] Sink Primary Care Physician: Idelle Leech Other Clinician: Referring Physician: Idelle Leech Treating Physician/Extender: Rudene Re in Treatment: 5 Active Inactive Abuse / Safety / Falls / Self Care Management Nursing Diagnoses: Impaired physical mobility Potential for falls Goals: Patient will remain injury free Date Initiated: 06/21/2015 Goal Status: Active Interventions: Assess fall risk on admission and as needed Notes: Nutrition Nursing Diagnoses: Impaired glucose control: actual or potential Goals: Patient/caregiver verbalizes understanding of need to maintain therapeutic glucose control per primary care physician Date Initiated: 06/21/2015 Goal Status: Active Interventions: Provide education on elevated blood sugars and impact on wound healing Notes: Orientation to the Wound Care Program Nursing Diagnoses: Knowledge deficit related to the wound healing center program GoalsCELENIA, HRUSKA ([id number]) Patient/caregiver will verbalize understanding of the Wound Healing Center Program Date Initiated: 06/21/2015 Goal Status: Active Interventions: Provide education on orientation to the wound center Notes: Soft Tissue Infection Nursing Diagnoses: Impaired tissue integrity Potential for infection: soft tissue Goals: Patient's soft tissue infection will resolve Date  Initiated: 06/21/2015 Goal Status: Active Interventions: Assess signs and symptoms of infection every visit Treatment Activities: Culture and sensitivity : 07/27/2015 Systemic antibiotics : 07/27/2015 Notes: Wound/Skin Impairment Nursing Diagnoses: Impaired tissue integrity Goals: Ulcer/skin breakdown will heal within 14 weeks Date Initiated: 06/21/2015 Goal Status: Active Interventions: Assess ulceration(s) every visit Treatment Activities: Topical wound management initiated : 07/27/2015 Notes: ALMINA, SCHUL ([id number]) Electronic Signature(s) Signed: 07/27/2015 10:10:46 AM By: Elpidio Eric BSN, RN Entered By: Elpidio Eric on 07/27/2015 10:10:45 Franqui, Natashia ([id number]) -------------------------------------------------------------------------------- Pain Assessment Details Patient Name: [name] [name] Date of Service: 07/27/2015 10:00 AM Medical Record Number: [id number] Patient Account Number: [id number] Date of Birth/Sex: [birth year] (74 y.o. Female) Treating RN: Clover Mealy, RN, BSN, [place] Sink Primary Care Physician: Idelle Leech Other Clinician: Referring Physician: Idelle Leech Treating Physician/Extender: Rudene Re in Treatment: 5 Active Problems Location of Pain Severity and Description of Pain Patient Has Paino No Site Locations Pain Management and Medication Current Pain Management: Electronic Signature(s) Signed: 07/27/2015 9:58:28 AM By: Elpidio Eric BSN, RN Entered By: Elpidio Eric on 07/27/2015 09:58:27 Lona, Denetria ([id number]) -------------------------------------------------------------------------------- Patient/Caregiver Education Details Patient Name: [name] [name] Date of Service: 07/27/2015 10:00 AM Medical Record Number: [id number] Patient Account Number: [id number] Date of Birth/Gender: Nov 06, 1940 (74 y.o. Female) Treating RN: Afful, RN, BSN, [place] Sink Primary Care Physician: Idelle Leech Other  Clinician: Referring Physician: Idelle Leech Treating Physician/Extender: Rudene Re in Treatment: 5 Education Assessment Education Provided To: Patient Education Topics Provided Elevated Blood Sugar/ Impact on Healing: Methods: Explain/Verbal Responses: State content correctly Welcome To The Wound Care Center: Methods: Explain/Verbal Responses: State content correctly Electronic Signature(s) Signed: 07/27/2015 10:26:15 AM By: Elpidio Eric BSN, RN Entered By: Elpidio Eric on 07/27/2015 10:26:14 [name] [name] ([id number]) -------------------------------------------------------------------------------- Wound Assessment Details Patient Name: [name] [name] Date of Service: 07/27/2015 10:00 AM Medical Record Number: [id number] Patient Account Number: [id number] Date of Birth/Sex: 1940/09/25 (74 y.o. Female) Treating RN: Afful, RN, BSN, Rita Primary Care Physician: Idelle Leech Other Clinician: Referring Physician: Idelle Leech Treating Physician/Extender: Rudene Re in Treatment: 5 Wound Status Wound Number: 1 Primary Diabetic Wound/Ulcer of the Lower Etiology: Extremity Wound Location: Right Toe Great - Lateral Wound Open Wounding Event: Gradually Appeared Status: Date Acquired: 05/28/2015 Comorbid Cataracts, Glaucoma, Peripheral Weeks Of Treatment: 5 History: Venous Disease, Type II Diabetes, Clustered Wound: No Osteoarthritis, Osteomyelitis, Neuropathy Photos Photo Uploaded By: Elpidio Eric on 07/27/2015 16:03:32 Wound Measurements Length: (cm) 2 Width: (cm) 1.4 Depth: (cm) 0.2 Area: (cm) 2.199 Volume: (cm) 0.44 % Reduction in Area: 76.7% % Reduction in Volume: 84.4% Epithelialization: None Tunneling: No Undermining: No Wound Description Classification: Grade 2 Wound Margin: Indistinct, nonvisible Exudate Amount: Medium Thorp, Teya ([id number]) Foul Odor After Cleansing: Yes Due to Product Use: No Exudate Type: Purulent Exudate Color: yellow, brown, green Wound  Bed Granulation Amount: Small (1-33%) Exposed Structure Granulation Quality: Red Fascia Exposed: Yes Necrotic Amount: Large (67-100%) Fat Layer Exposed: No Necrotic Quality: Adherent Slough Tendon Exposed: No Muscle Exposed: No Joint Exposed: No Bone Exposed: No Periwound Skin Texture Texture Color No Abnormalities Noted: No No Abnormalities Noted: No Callus: Yes Atrophie Blanche: No Crepitus: No Cyanosis: No Excoriation: No Ecchymosis: No Fluctuance: No Erythema: No Friable: No Hemosiderin Staining: No Induration: No Mottled: No Localized Edema: No Pallor: No Rash: No Rubor: No Scarring: No Temperature / Pain Moisture Tenderness on Palpation: Yes No Abnormalities Noted: No Dry / Scaly: No Maceration: Yes Moist: Yes Wound Preparation Ulcer Cleansing: Rinsed/Irrigated with Saline Topical Anesthetic Applied: Other: lidocaine 4%, Treatment Notes Wound #1 (Right, Lateral Toe Great) 1. Cleansed with: Clean wound with Normal Saline 4. Dressing Applied: Santyl Ointment 5. Secondary Dressing Applied Gauze and Kerlix/Conform 7. Secured with Secretary/administrator) Signed: 07/27/2015 10:03:41 AM By: Elpidio Eric BSN, RN [place], Mirriam ([id number]) Entered By: Elpidio Eric on 07/27/2015 10:03:41 NOLENE, ROCKS ([id number]) -------------------------------------------------------------------------------- Vitals Details Patient Name: [name] [name] Date of Service: 07/27/2015 10:00 AM Medical Record Number: [id number] Patient Account Number: [id number] Date of Birth/Sex: 1941-04-02 (74 y.o. Female) Treating RN: Afful, RN, BSN, [place] Sink Primary Care Physician: Idelle Leech Other Clinician: Referring Physician: Idelle Leech Treating Physician/Extender: Rudene Re in Treatment: 5 Vital Signs Time Taken: 10:00 Temperature (F): 98.4 Height (in): 66 Pulse (bpm): 78 Weight (lbs): 195 Respiratory Rate (breaths/min): 18 Body Mass Index (BMI):  31.5 Blood Pressure (mmHg): 146/57 Reference Range: 80 - 120 mg / dl Electronic Signature(s) Signed: 07/27/2015 10:00:39 AM By: Elpidio Eric BSN, RN Entered By: Elpidio Eric on 07/27/2015 10:00:38

## 2015-08-06 ENCOUNTER — Encounter: Payer: Medicare (Managed Care) | Admitting: Surgery

## 2015-08-06 DIAGNOSIS — E11621 Type 2 diabetes mellitus with foot ulcer: Secondary | ICD-10-CM | POA: Diagnosis not present

## 2015-08-07 NOTE — Progress Notes (Addendum)
[name] [name], Dacy ([id number]) Visit Report for 08/06/2015 Arrival Information Details Patient Name: [name] [name], [name] [name] 08/06/2015 11:00 Date of Service: AM Medical Record [id number] Number: Patient Account Number: [id number] 1941/07/10 (74 y.o. Treating RN: Leonard Downingoseboro, Sendra Date of Birth/Sex: Female) Other Clinician: Primary Care Physician: Idelle LeechKWASCHYN, KATIE Treating Britto, Errol Referring Physician: Idelle LeechKWASCHYN, KATIE Physician/Extender: Weeks in Treatment: 6 Visit Information History Since Last Visit All ordered tests and consults were No Patient Arrived: Wheel Chair completed: Arrival Time: 11:36 Added or deleted any medications: No Transfer Assistance: None Any new allergies or adverse reactions: No Patient Identification Verified: Yes Had a fall or experienced change in No Secondary Verification Process Yes activities of daily living that may affect Completed: risk of falls: Patient Requires Transmission- No Signs or symptoms of abuse/neglect since No Based Precautions: last visito Patient Has Alerts: Yes Hospitalized since last visit: No Patient Alerts: Patient on Blood Has Dressing in Place as Prescribed: Yes Thinner Has Footwear/Offloading in Place as Yes Type II Diabetic Prescribed: 81mg  Aspirin Right: Wedge Shoe Pain Present Now: No Electronic Signature(s) Signed: 08/06/2015 11:37:18 AM By: Elpidio EricAfful, Rita BSN, RN Signed: 08/06/2015 4:23:47 PM By: Lucrezia Starchoseboro, RN, Sendra Entered By: Lucrezia Starchoseboro, RN, Sendra on 08/06/2015 11:37:18 [place], [place] ([id number]) -------------------------------------------------------------------------------- Clinic Level of Care Assessment Details Patient Name: [name] [name], [name] [name] 08/06/2015 11:00 Date of Service: AM Medical Record [id number] Number: Patient Account Number: [id number] 1941/07/10 (74 y.o. Treating RN: Leonard Downing[place]boro, Sendra Date of Birth/Sex: Female) Other Clinician: Primary Care Physician: Idelle LeechKWASCHYN, KATIE Treating Britto,  Errol Referring Physician: Idelle LeechKWASCHYN, KATIE Physician/Extender: Weeks in Treatment: 6 Clinic Level of Care Assessment Items TOOL 4 Quantity Score X - Use when only an EandM is performed on FOLLOW-UP visit 1 0 ASSESSMENTS - Nursing Assessment / Reassessment X - Reassessment of Co-morbidities (includes updates in patient status) 1 10 X - Reassessment of Adherence to Treatment Plan 1 5 ASSESSMENTS - [name] and Skin Assessment / Reassessment X - Simple [name] Assessment / Reassessment - one [name] 1 5 []  - Complex [name] Assessment / Reassessment - multiple wounds 0 []  - Dermatologic / Skin Assessment (not related to [name] area) 0 ASSESSMENTS - Focused Assessment []  - Circumferential Edema Measurements - multi extremities 0 []  - Nutritional Assessment / Counseling / Intervention 0 X - Lower Extremity Assessment (monofilament, tuning fork, pulses) 1 5 []  - Peripheral Arterial Disease Assessment (using hand held doppler) 0 ASSESSMENTS - Ostomy and/or Continence Assessment and Care []  - Incontinence Assessment and Management 0 []  - Ostomy Care Assessment and Management (repouching, etc.) 0 PROCESS - Coordination of Care X - Simple Patient / Family Education for ongoing care 1 15 []  - Complex (extensive) Patient / Family Education for ongoing care 0 []  - Staff obtains ChiropractorConsents, Records, Test Results / Process Orders 0 X - Staff telephones HHA, Nursing Homes / Clarify orders / etc 1 10 Vossler, Jere ([id number]) []  - Routine Transfer to another Facility (non-emergent condition) 0 []  - Routine Hospital Admission (non-emergent condition) 0 []  - New Admissions / Manufacturing engineernsurance Authorizations / Ordering NPWT, Apligraf, etc. 0 []  - Emergency Hospital Admission (emergent condition) 0 X - Simple Discharge Coordination 1 10 []  - Complex (extensive) Discharge Coordination 0 PROCESS - Special Needs []  - Pediatric / Minor Patient Management 0 []  - Isolation Patient Management 0 []  - Hearing / Language /  Visual special needs 0 []  - Assessment of Community assistance (transportation, D/C planning, etc.) 0 []  - Additional assistance / Altered mentation 0 []  - Support Surface(s) Assessment (bed, cushion, seat, etc.) 0 INTERVENTIONS - [name]  Cleansing / Measurement X - Simple [name] Cleansing - one [name] 1 5  - Complex [name] Cleansing - multiple wounds 0 X - [name] Imaging (photographs - any number of wounds) 1 5  - [name] Tracing (instead of photographs) 0 X - Simple [name] Measurement - one [name] 1 5  - Complex [name] Measurement - multiple wounds 0 INTERVENTIONS - [name] Dressings X - Small [name] Dressing one or multiple wounds 1 10  - Medium [name] Dressing one or multiple wounds 0  - Large [name] Dressing one or multiple wounds 0 X - Application of Medications - topical 1 5  - Application of Medications - injection 0 Platas, Tiani ([id number]) INTERVENTIONS - Miscellaneous  - External ear exam 0  - Specimen Collection (cultures, biopsies, blood, body fluids, etc.) 0  - Specimen(s) / Culture(s) sent or taken to Lab for analysis 0  - Patient Transfer (multiple staff / Nurse, adult / Similar devices) 0  - Simple Staple / Suture removal (25 or less) 0  - Complex Staple / Suture removal (26 or more) 0  - Hypo / Hyperglycemic Management (close monitor of Blood Glucose) 0  - Ankle / Brachial Index (ABI) - do not check if billed separately 0 X - Vital Signs 1 5 Has the patient been seen at the hospital within the last three years: Yes Total Score: 95 Level Of Care: New/Established - Level 3 Electronic Signature(s) Signed: 08/06/2015 4:23:47 PM By: Lucrezia Starch, RN, Sendra Entered By: Lucrezia Starch RN, Sendra on 08/06/2015 16:15:53 [name] [name], [name] [name] ([id number]) -------------------------------------------------------------------------------- Encounter Discharge Information Details Patient Name: [name] [name], [name] [name] 08/06/2015 11:00 Date of Service: AM Medical  Record [id number] Number: Patient Account Number: [id number] 1940-12-09 (74 y.o. Treating RN: Leonard Downing Date of Birth/Sex: Female) Other Clinician: Primary Care Physician: Idelle Leech Treating [name] Referring Physician: Idelle Leech Physician/Extender: Weeks in Treatment: 6 Encounter Discharge Information Items Schedule Follow-up Appointment: No Medication Reconciliation completed and provided to Patient/Care No Analissa Bayless: Provided on Clinical Summary of Care: 08/06/2015 Form Type Recipient Paper Patient ML Electronic Signature(s) Signed: 08/06/2015 12:05:10 PM By: Gwenlyn Perking Entered By: Gwenlyn Perking on 08/06/2015 12:05:10 Keady, Denajah ([id number]) -------------------------------------------------------------------------------- General Visit Notes Details Patient Name: [name] [name], [name] [name] 08/06/2015 11:00 Date of Service: AM Medical Record [id number] Number: Patient Account Number: [id number] 1941-09-04 (74 y.o. Treating RN: Leonard Downing Date of Birth/Sex: Female) Other Clinician: Primary Care Physician: Idelle Leech Treating Britto, Errol Referring Physician: Idelle Leech Physician/Extender: [name] in Treatment: 6 Notes PACE coordinator, Herbert Deaner called to office, they will get the notes from podiatry and fax them over to office. They have also initiated a referral to vascular per podiatry and is awaiting appointment notification. Also wanted chart to reflect that patient did received antibiotics IV x 1week but pain at [name] site is new for they have been able to palpate area without pain as of Friday 08/03/2015. Electronic Signature(s) Signed: 08/06/2015 3:05:10 PM By: Lucrezia Starch RN, Lennice Sites By: Lucrezia Starch RN, Sendra on 08/06/2015 15:05:10 [name] [name], [name] [name] ([id number]) -------------------------------------------------------------------------------- Lower Extremity Assessment Details Patient Name: ANGELLE, ISAIS 08/06/2015  11:00 Date of Service: AM Medical Record [id number] Number: Patient Account Number: [id number] September 13, 1941 (74 y.o. Treating RN: Leonard Downing Date of Birth/Sex: Female) Other Clinician: Primary Care Physician: Idelle Leech Treating Britto, Errol Referring Physician: Idelle Leech Physician/Extender: Weeks in Treatment: 6 Vascular Assessment Pulses: Posterior Tibial Palpable: [Right:Yes] Extremity colors, hair growth, and conditions: Extremity Color: [Right:Dusky] Hair Growth on Extremity: [Right:No] Temperature of Extremity: [Right:Warm] Capillary Refill: [Right:< 3 seconds] Dependent Rubor: [Right:No] Blanched when Elevated: [Right:No]  Lipodermatosclerosis: [Right:No] Toe Nail Assessment Left: Right: Thick: Yes Discolored: No Deformed: Yes Improper Length and Hygiene: No Electronic Signature(s) Signed: 08/06/2015 11:44:48 AM By: Elpidio Eric BSN, RN Signed: 08/06/2015 4:23:47 PM By: Lucrezia Starch RN, Sendra Entered By: Lucrezia Starch RN, Sendra on 08/06/2015 11:44:48 [place]ham, Cedra ([id number]) -------------------------------------------------------------------------------- Pain Assessment Details Patient Name: ZAHLI, VETSCH 08/06/2015 11:00 Date of Service: AM Medical Record [id number] Number: Patient Account Number: [id number] 05/01/1941 (74 y.o. Treating RN: Leonard Downing Date of Birth/Sex: Female) Other Clinician: Primary Care Physician: Idelle Leech Treating Britto, Errol Referring Physician: Idelle Leech Physician/Extender: Weeks in Treatment: 6 Active Problems Location of Pain Severity and Description of Pain Patient Has Paino No Site Locations Rate the pain. Current Pain Level: 0 Pain Management and Medication Current Pain Management: Electronic Signature(s) Signed: 08/06/2015 11:37:25 AM By: Elpidio Eric BSN, RN Signed: 08/06/2015 4:23:47 PM By: Lucrezia Starch RN, Sendra Entered By: Lucrezia Starch RN, Sendra on 08/06/2015 11:37:25 [name] [name], [name] [name]  ([id number]) -------------------------------------------------------------------------------- Patient/Caregiver Education Details Patient Name: [name] [name], [name] [name] 08/06/2015 11:00 Date of Service: AM Medical Record [id number] Number: Patient Account Number: [id number] [birth year] (74 y.o. Treating RN: Leonard Downing Date of Birth/Gender: Female) Other Clinician: Primary Care Physician: Idelle Leech Treating [name] Referring Physician: Idelle Leech Physician/Extender: [name] in Treatment: 6 Education Assessment Education Provided To: Patient Education Topics Provided [name] Debridement: Handouts: [name] Debridement Methods: Explain/Verbal Responses: State content correctly Electronic Signature(s) Signed: 08/06/2015 4:23:47 PM By: Lucrezia Starch RN, Sendra Entered By: Lucrezia Starch RN, Sendra on 08/06/2015 12:07:17 [name] [name], [name] [name] ([id number]) -------------------------------------------------------------------------------- [name] Assessment Details Patient Name: [name], [name] [name] 08/06/2015 11:00 Date of Service: AM Medical Record [id number] Number: Patient Account Number: [id number] 29-May-1941 (74 y.o. 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 [name] Location: Right Toe Great - Lateral [name] Open Wounding Event: Gradually Appeared Status: Date Acquired: 05/28/2015 Comorbid Cataracts, Glaucoma, Peripheral Weeks Of Treatment: 6 History: Venous Disease, Type II Diabetes, Clustered [name]: No Osteoarthritis, Osteomyelitis, Neuropathy Photos [name] Measurements Length: (cm) 1.6 % Reduction in Ar Width: (cm) 1.6 % Reduction in Vo Depth: (cm) 0.2 Epithelialization Area: (cm) 2.011 Tunneling: Volume: (cm)  0.402 Undermining: ea: 78.7% lume: 85.8% : None No No [name] Description Classification: Grade 2 Foul Odor After C [name] Margin: Indistinct, nonvisible Exudate Amount: Medium Exudate Type: Purulent Exudate Color: yellow, brown, green leansing: No [name] Bed Granulation Amount: Small (1-33%) Exposed Structure Granulation Quality: Red Fascia Exposed: Yes [name] [name], [name] [name] ([id number]) Necrotic Amount: Large (67-100%) Fat Layer Exposed: No Necrotic Quality: Adherent Slough Tendon Exposed: No Muscle Exposed: No Joint Exposed: No Bone Exposed: No Periwound Skin Texture Texture Color No Abnormalities Noted: No No Abnormalities Noted: No Callus: Yes Atrophie Blanche: No Crepitus: No Cyanosis: No Excoriation: No Ecchymosis: No Fluctuance: No Erythema: No Friable: No Hemosiderin Staining: No Induration: No Mottled: No Localized Edema: No Pallor: No Rash: No Rubor: No Scarring: No Temperature / Pain Moisture Tenderness on Palpation: Yes No Abnormalities Noted: No Dry / Scaly: Yes Maceration: No Moist: No [name] Preparation Ulcer Cleansing: Rinsed/Irrigated with Saline Topical Anesthetic Applied: Other: lidocaine 4%, Treatment Notes [name] #1 (Right, Lateral Toe Great) 1. Cleansed with: Clean [name] with Normal Saline 2. Anesthetic Topical Lidocaine 4% cream to [name] bed prior to debridement 3. Peri-[name] Care: Skin Prep 4. Dressing Applied: Santyl Ointment 5. Secondary Dressing Applied Gauze and Kerlix/Conform 6. Footwear/Offloading device applied Other footwear/offloading device applied (specify in notes)  Notes darco shoe Electronic Signature(s) [name] [name], Michayla ([id number]) LOUDINgned: 08/07/2015 11:55:16 AM By: Elpidio Eric BSN, RN Signed: 08/13/2015 4:48:38 PM By: Lucrezia Starch RN, Sendra Previous Signature: 08/06/2015 11:45:35 AM Version By: Elpidio Eric BSN, RN Previous Signature: 08/06/2015 4:23:47 PM Version By: Lucrezia Starch RN, Sendra Entered By: Elpidio Eric on  08/07/2015 11:55:15 Kregel, Teairra ([id number]) -------------------------------------------------------------------------------- Vitals Details Patient Name: SHANETHA, BRADHAM 08/06/2015 11:00 Date of Service: AM Medical Record [id number] Number: Patient Account Number: [id number] [birth year] (74 y.o. Treating RN: Leonard Downing Date of Birth/Sex: Female) Other Clinician: Primary Care Physician: Idelle Leech Treating Britto, Errol Referring Physician: Idelle Leech Physician/Extender: Weeks in Treatment: 6 Vital Signs Time Taken: 11:40 Temperature (F): 98.6 Height (in): 66 Pulse (bpm): 69 Weight (lbs): 195 Respiratory Rate (breaths/min): 16 Body Mass Index (BMI): 31.5 Blood Pressure (mmHg): 153/61 Reference Range: 80 - 120 mg / dl Pulse Oximetry (%): 086 Notes BS checked at facility this morning Electronic Signature(s) Signed: 08/06/2015 11:40:36 AM By: Elpidio Eric BSN, RN Signed: 08/06/2015 4:23:47 PM By: Lucrezia Starch RN, Sendra Entered By: Lucrezia Starch RN, Sendra on 08/06/2015 11:40:36

## 2015-08-07 NOTE — Progress Notes (Addendum)
[name] [name] ([id number]) Visit Report for 08/06/2015 Chief Complaint Document Details Patient Name: [name] [name], [name] [name] 08/06/2015 11:00 Date of Service: AM Medical Record [id number] Number: Patient Account Number: [id number] 1941-09-05 (74 y.o. Treating RN: Leonard Downing Date of Birth/Sex: Female) Other Clinician: Primary Care Physician: Idelle Leech Treating [name] [name] Referring Physician: Idelle Leech Physician/Extender: Weeks in Treatment: 6 Information Obtained from: Patient Chief Complaint Patient presents to the wound care center for a consult due non healing wound. the patient came last week to the wound care center with a ulcerated area on the medial part of her right foot which she's had for about 6 weeks now. Electronic Signature(s) Signed: 08/06/2015 12:02:04 PM By: [name] MD, FACS Entered By: [name] on 08/06/2015 12:02:03 [name] [name] ([id number]) -------------------------------------------------------------------------------- HPI Details Patient Name: [name] [name], [name] [name] 08/06/2015 11:00 Date of Service: AM Medical Record [id number] Number: Patient Account Number: [id number] 10/03/1940 (74 y.o. Treating RN: Leonard Downing Date of Birth/Sex: Female) Other Clinician: Primary Care Physician: Idelle Leech Treating [name] [name] Referring Physician: Idelle Leech Physician/Extender: Weeks in Treatment: 6 History of Present Illness Location: wound on the medial part of her right forefoot near the first metatarsal Quality: Patient reports experiencing a dull pain to affected area(s). Severity: Patient states wound are getting worse. Duration: Patient has had the wound for < 6 weeks prior to presenting for treatment Timing: Pain in wound is Intermittent (comes and goes Context: The wound appeared gradually over time Modifying Factors: Consults to this date include: she was seen by her PCP but there is no evidence of any definite workup  with x-rays. I understand she was advised IV ciprofloxacin and clindamycin but the patient refused Associated Signs and Symptoms: Patient reports having difficulty standing for long periods. HPI Description: 74 year old patient who is alert and oriented and comes to see [place] with a history of having a wound on the medial part of her right forefoot which she's had for about 6 weeks. she has a past medical history of diabetes mellitus with peripheral angiopathy without gangrene, allergic rhinitis, hypertensive heart disease, glaucoma, chronic venous insufficiency, diverticulitis and asthma. the patient has never smoked cigarettes. I understand an x-ray may be done of the right foot and we will await this report prior to ordering further tests. 07/12/2015-- x-ray of the right foot done on 07/11/2015 which was compared to the one done on 06/20/2015 shows no acute fracture or dislocation on the x-ray. 07/27/2015 -- the patient has not been here to see [place] for 2 weeks and in the meanwhile she has not yet seen a podiatrist. 08/06/2015 -- the patient did see a podiatrist and we have not gotten any official notes but she says that he trimmed her nails and gave her a offloading shoe which she was asked to wear. He has not recommended any surgical debridement. Electronic Signature(s) Signed: 08/06/2015 12:02:57 PM By: [name] MD, FACS Entered By: [name] on 08/06/2015 12:02:57 NIKOLETA, [name] ([id number]) -------------------------------------------------------------------------------- Physical Exam Details Patient Name: [name] [name], [name] [name] 08/06/2015 11:00 Date of Service: AM Medical Record [id number] Number: Patient Account Number: [id number] May 16, 1941 (74 y.o. Treating RN: Leonard Downing Date of Birth/Sex: Female) Other Clinician: Primary Care Physician: Idelle Leech Treating [name] Referring Physician: Idelle Leech Physician/Extender: Weeks in Treatment:  6 Constitutional . Pulse regular. Respirations normal and unlabored. Afebrile. . Eyes Nonicteric. Reactive to light. Ears, Nose, Mouth, and Throat Lips, teeth, and gums WNL.Marland Kitchen Moist mucosa without lesions . Neck supple and nontender. No palpable supraclavicular or cervical adenopathy. Normal sized without goiter.  Respiratory WNL. No retractions.. Cardiovascular Pedal Pulses WNL. No clubbing, cyanosis or edema. Chest Breasts symmetical and no nipple discharge.. Breast tissue WNL, no masses, lumps, or tenderness.. Lymphatic No adneopathy. No adenopathy. No adenopathy. Musculoskeletal Adexa without tenderness or enlargement.. Digits and nails w/o clubbing, cyanosis, infection, petechiae, ischemia, or inflammatory conditions.. Integumentary (Hair, Skin) No suspicious lesions. No crepitus or fluctuance. No peri-wound warmth or erythema. No masses.Marland Kitchen Psychiatric Judgement and insight Intact.. No evidence of depression, anxiety, or agitation.. Notes The wound on the medial part of the right foot region of the first metatarsal continues to have slough and debridement is not possible as it is very tender. Electronic Signature(s) Signed: 08/06/2015 12:03:31 PM By: [name] MD, FACS Entered By: [name] on 08/06/2015 12:03:31 [name] [name], [name] [name] ([id number]) -------------------------------------------------------------------------------- Physician Orders Details Patient Name: [name] [name], [name] [name] 08/06/2015 11:00 Date of Service: AM Medical Record [id number] Number: Patient Account Number: [id number] 1940-11-19 (74 y.o. Treating RN: Leonard Downing Date of Birth/Sex: Female) Other Clinician: Primary Care Physician: Idelle Leech Treating [name] [name] Referring Physician: Idelle Leech Physician/Extender: [name] [name] in Treatment: 6 Verbal / Phone Orders: Yes Clinician: Leonard Downing Read Back and Verified: No Diagnosis Coding Wound Cleansing Wound #1 Right,Lateral Toe Great o  Cleanse wound with mild soap and water Anesthetic Wound #1 Right,Lateral Toe Great o Topical Lidocaine 4% cream applied to wound bed prior to debridement Skin Barriers/Peri-Wound Care Wound #1 Right,Lateral Toe Great o Skin Prep Primary Wound Dressing Wound #1 Right,Lateral Toe Great o Santyl Ointment - apply just enough to wound bed only, not on healthy skin. Secondary Dressing o Conform/Kerlix Wound #1 Right,Lateral Toe Great o Gauze and Kerlix/Conform Dressing Change Frequency Wound #1 Right,Lateral Toe Great o Change Dressing Monday, Wednesday, Friday Follow-up Appointments Wound #1 Right,Lateral Toe Great o Return Appointment in 1 week. Off-Loading Wound #1 Right,Lateral Toe Great o Other: - Darco shoe recommended by podiatry Nason, Kaprice ([id number]) Electronic Signature(s) Signed: 08/06/2015 11:57:19 AM By: Maureen Chatters Signed: 08/06/2015 4:11:13 PM By: [name] MD, FACS Entered By: Lucrezia Starch RN, [place] on 08/06/2015 11:57:18 [name] [name], [name] [name] ([id number]) -------------------------------------------------------------------------------- Problem List Details Patient Name: [name] [name], [name] [name] 08/06/2015 11:00 Date of Service: AM Medical Record [id number] Number: Patient Account Number: [id number] 11-Jun-1941 (74 y.o. 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 Treating RN: Leonard Downing Date of Birth/Sex: Female) Other Clinician: Primary Care Physician: Idelle Leech Treating Carrie Schoonmaker Referring Physician: Idelle Leech Physician/Extender: [name] [name] in Treatment: 6 Subjective Chief Complaint Information obtained from Patient Patient presents to the wound care center for a consult due non healing wound. the patient came last week to the wound care center with a ulcerated area on the medial part of her right foot which she's had for about 6 weeks now. History of Present Illness (HPI) The following HPI elements were documented for the patient's wound: Location: wound on the medial part of her right forefoot near the first metatarsal  Quality: Patient reports experiencing a dull pain to affected area(s). Severity: Patient states wound are getting worse. Duration: Patient has had the wound for < 6 weeks prior to presenting for treatment Timing: Pain in wound is Intermittent (comes and goes Context: The wound appeared gradually over time Modifying Factors: Consults to this date include: she was seen by her PCP but there is no evidence of any definite workup with x-rays. I understand she was advised IV ciprofloxacin and clindamycin but the patient refused Associated Signs and Symptoms: Patient reports having difficulty standing for long periods. 74 year old patient who is alert and oriented and comes to see us with a history of having a wound on the medial part of her right forefoot which she's had for about 6 weeks. she has a past medical history of diabetes mellitus with peripheral angiopathy without gangrene, allergic rhinitis, hypertensive heart  disease, glaucoma, chronic venous insufficiency, diverticulitis and asthma. the patient has never smoked cigarettes. I understand an x-ray may be done of the right foot and we will await this report prior to ordering further tests. 07/12/2015-- x-ray of the right foot done on 07/11/2015 which was compared to the one done on 06/20/2015 shows no acute fracture or dislocation on the x-ray. 07/27/2015 -- the patient has not been here to see us for 2 weeks and in the meanwhile she has not yet seen a podiatrist. 08/06/2015 -- the patient did see a podiatrist and we have not gotten any official notes but she says that he trimmed her nails and gave her a offloading shoe which she was asked to wear. He has not recommended any surgical debridement. [name] [name], [name] [name] ([id number]) Objective Constitutional Pulse regular. Respirations normal and unlabored. Afebrile. Vitals Time Taken: 11:40 AM, Height: 66 in, Weight: 195 lbs, BMI: 31.5, Temperature: 98.6 F, Pulse: 69 bpm, Respiratory Rate: 16 breaths/min, Blood Pressure: 153/61 mmHg, Pulse Oximetry: 100 %. General Notes: BS checked at facility this morning Eyes Nonicteric. Reactive to light. Ears, Nose, Mouth, and Throat Lips, teeth, and gums WNL.Marland Kitchen. Moist mucosa without lesions . Neck supple and nontender. No palpable supraclavicular or cervical adenopathy. Normal sized without goiter. Respiratory WNL. No retractions.. Cardiovascular Pedal Pulses WNL. No clubbing, cyanosis or edema. Chest Breasts symmetical and no nipple discharge.. Breast tissue WNL, no masses, lumps, or tenderness.. Lymphatic No adneopathy. No adenopathy. No adenopathy. Musculoskeletal Adexa without tenderness or enlargement.. Digits and nails w/o clubbing, cyanosis, infection, petechiae, ischemia, or inflammatory conditions.Marland Kitchen. Psychiatric Judgement and insight Intact.. No evidence of depression, anxiety, or agitation.. General Notes: The wound on the medial part of the right  foot region of the first metatarsal continues to have slough and debridement is not possible as it is very tender. Integumentary (Hair, Skin) No suspicious lesions. No crepitus or fluctuance. No peri-wound warmth or erythema. No masses.Marland Kitchen. [name] [name], [name] [name] ([id number]) Wound #1 status is Open. Original cause of wound was Gradually Appeared. The wound is located on the [employer] Lockeright,Lateral Toe Great. The wound measures 1.6cm length x 1.6cm width x 0.2cm depth; 2.011cm^2 area and 0.402cm^3 volume. There is fascia exposed. There is no tunneling or undermining noted. There is a medium amount of purulent drainage noted. The wound margin is indistinct and nonvisible. There is small (1-33%) red granulation within the wound bed. There is a large (67-100%) amount of necrotic tissue within the wound bed including Adherent Slough. The periwound skin appearance exhibited: Callus, Dry/Scaly. The periwound skin appearance did not exhibit: Crepitus, Excoriation, Fluctuance, Friable, Induration, Localized Edema, Rash, Scarring, Maceration, Moist, Atrophie Blanche,  Cyanosis, Ecchymosis, Hemosiderin Staining, Mottled, Pallor, Rubor, Erythema. The periwound has tenderness on palpation. 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 Strict instructions for the amount of Santyl ointment to be used has been discussed. She has had a podiatry consult, but we have not received her notes nausea and available in Epic. I was hoping that they would consider operative correction of this area and appropriate debridement of the bone if they so deemed fit. We will await these reports. She will come back and see me next week. Plan Wound Cleansing: Wound #1  Right,Lateral Toe Great: Cleanse wound with mild soap and water Anesthetic: Wound #1 Right,Lateral Toe Great: Topical Lidocaine 4% cream applied to wound bed prior to debridement Skin Barriers/Peri-Wound Care: Wound #1 Right,Lateral Toe Great: Skin Prep Primary Wound Dressing: [name] [name], [name] [name] ([id number]) Wound #1 Right,Lateral Toe Great: Santyl Ointment - apply just enough to wound bed only, not on healthy skin. Secondary Dressing: Conform/Kerlix Wound #1 Right,Lateral Toe Great: Gauze and Kerlix/Conform Dressing Change Frequency: Wound #1 Right,Lateral Toe Great: Change Dressing Monday, Wednesday, Friday Follow-up Appointments: Wound #1 Right,Lateral Toe Great: Return Appointment in 1 week. Off-Loading: Wound #1 Right,Lateral Toe Great: Other: - Darco shoe recommended by podiatry I have recommended we continue with Santyl ointment locally and a bordered foam and change this every other day when she goes to the [place] center. Strict instructions for the amount of Santyl ointment to be used has been discussed. She has had a podiatry consult, but we have not received her notes nausea and available in Epic. I was hoping that they would consider operative correction of this area and appropriate debridement of the bone if they so deemed fit. We will await these reports. She will come back and see me next week. Electronic Signature(s) Signed: 08/07/2015 4:53:41 PM By: [name] MD, FACS Previous Signature: 08/06/2015 12:05:29 PM Version By: [name] MD, FACS Entered By: [name] on 08/07/2015 16:53:41 Zuk, Myrl ([id number]) -------------------------------------------------------------------------------- SuperBill Details Patient Name: [name] Date of Service: 08/06/2015 Medical Record Number: [id number] Patient Account Number: [id number] Date of Birth/Sex: [birth year] (74 y.o. Female) Treating RN: Leonard Downing Primary Care Physician: Idelle Leech Other  Clinician: Referring Physician: Idelle Leech Treating Physician/Extender: Rudene Re in Treatment: 6 Diagnosis Coding ICD-10 Codes Code Description E11.621 Type 2 diabetes mellitus with foot ulcer L97.513 Non-pressure chronic ulcer of other part of right foot with necrosis of muscle Facility Procedures CPT4 Code: [id number] Description: 99213 - WOUND CARE VISIT-LEV 3 EST PT Modifier: Quantity: 1 Physician Procedures CPT4: Description Modifier Quantity Code [id number] 99213 - WC PHYS LEVEL 3 - EST PT 1 ICD-10 Description Diagnosis E11.621 Type 2 diabetes mellitus with foot ulcer L97.513 Non-pressure chronic ulcer of other part of right foot with necrosis of muscle Electronic Signature(s) Signed: 08/06/2015 4:22:09 PM By: [name] MD, FACS Signed: 08/06/2015 4:23:47 PM By: Lucrezia Starch RN, [place] Previous Signature: 08/06/2015 12:05:43 PM Version By: [name] MD, FACS Entered By: Lucrezia Starch RN, [place] on 08/06/2015 16:16:10

## 2015-09-11 ENCOUNTER — Encounter: Payer: Medicare (Managed Care) | Attending: General Surgery | Admitting: General Surgery

## 2015-09-11 ENCOUNTER — Encounter: Payer: Self-pay | Admitting: General Surgery

## 2015-09-11 DIAGNOSIS — E1140 Type 2 diabetes mellitus with diabetic neuropathy, unspecified: Secondary | ICD-10-CM | POA: Diagnosis not present

## 2015-09-11 DIAGNOSIS — M1990 Unspecified osteoarthritis, unspecified site: Secondary | ICD-10-CM | POA: Insufficient documentation

## 2015-09-11 DIAGNOSIS — L97519 Non-pressure chronic ulcer of other part of right foot with unspecified severity: Secondary | ICD-10-CM | POA: Insufficient documentation

## 2015-09-11 DIAGNOSIS — E11621 Type 2 diabetes mellitus with foot ulcer: Secondary | ICD-10-CM | POA: Diagnosis not present

## 2015-09-11 DIAGNOSIS — E079 Disorder of thyroid, unspecified: Secondary | ICD-10-CM | POA: Insufficient documentation

## 2015-09-11 DIAGNOSIS — L97513 Non-pressure chronic ulcer of other part of right foot with necrosis of muscle: Secondary | ICD-10-CM | POA: Diagnosis not present

## 2015-09-11 NOTE — Progress Notes (Signed)
seeiheal 

## 2015-09-12 NOTE — Progress Notes (Signed)
[name], [name] ([id number]) Visit Report for 09/11/2015 Arrival Information Details Patient Name: [name] [name], [name] [name] Date of Service: 09/11/2015 11:00 AM Medical Record Number: [id number] Patient Account Number: [id number] Date of Birth/Sex: 01/12/1941 (74 y.o. Female) Treating RN: Curtis Sites Primary Care Physician: Idelle Leech Other Clinician: Referring Physician: Idelle Leech Treating Physician/Extender: Elayne Snare in Treatment: 11 Visit Information History Since Last Visit Added or deleted any medications: No Patient Arrived: Walker Any new allergies or adverse reactions: No Arrival Time: 11:28 Had a fall or experienced change in No Accompanied By: self activities of daily living that may affect Transfer Assistance: None risk of falls: Patient Identification Verified: Yes Signs or symptoms of abuse/neglect since last No Secondary Verification Process Yes visito Completed: Hospitalized since last visit: No Patient Requires Transmission- No Pain Present Now: No Based Precautions: Patient Has Alerts: Yes Patient Alerts: Patient on Blood Thinner Type II Diabetic  Aspirin Electronic Signature(s) Signed: 09/11/2015 5:41:48 PM By: Curtis Sites Entered By: Curtis Sites on 09/11/2015 11:28:28 Rosenbach, Jameson ([id number]) -------------------------------------------------------------------------------- Encounter Discharge Information Details Patient Name: [name] [name] Date of Service: 09/11/2015 11:00 AM Medical Record Number: [id number] Patient Account Number: [id number] Date of Birth/Sex: [birth year] (74 y.o. Female) Treating RN: Curtis Sites Primary Care Physician: Idelle Leech Other Clinician: Referring Physician: Idelle Leech Treating Physician/Extender: Elayne Snare in Treatment: 11 Encounter Discharge Information Items Discharge Pain Level: 0 Discharge Condition: Stable Ambulatory Status: Walker Discharge Destination:  Home Transportation: Private Auto Accompanied By: self Schedule Follow-up Appointment: Yes Medication Reconciliation completed and provided to Patient/Care No Sierra Bissonette: Provided on Clinical Summary of Care: 09/11/2015 Form Type Recipient Paper Patient ML Electronic Signature(s) Signed: 09/11/2015 12:10:24 PM By: Curtis Sites Previous Signature: 09/11/2015 12:07:43 PM Version By: Ardath Sax MD Previous Signature: 09/11/2015 11:50:54 AM Version By: Francie Massing Entered By: Curtis Sites on 09/11/2015 12:10:23 Cannata, Ely ([id number]) -------------------------------------------------------------------------------- Lower Extremity Assessment Details Patient Name: [name] [name] Date of Service: 09/11/2015 11:00 AM Medical Record Number: [id number] Patient Account Number: [id number] Date of Birth/Sex: [birth year] (74 y.o. Female) Treating RN: Curtis Sites Primary Care Physician: Idelle Leech Other Clinician: Referring Physician: Idelle Leech Treating Physician/Extender: Elayne Snare in Treatment: 11 Edema Assessment Assessed: [Left: No] [Right: No] Edema: [Left: Ye] [Right: s] Vascular Assessment Pulses: Posterior Tibial Dorsalis Pedis Palpable: [Right:Yes] Extremity colors, hair growth, and conditions: Extremity Color: [Right:Hyperpigmented] Hair Growth on Extremity: [Right:No] Temperature of Extremity: [Right:Warm] Capillary Refill: [Right:< 3 seconds] Electronic Signature(s) Signed: 09/11/2015 5:41:48 PM By: Curtis Sites Entered By: Curtis Sites on 09/11/2015 11:34:28 Litt, Tyne ([id number]) -------------------------------------------------------------------------------- Multi Wound Chart Details Patient Name: [name] [name] Date of Service: 09/11/2015 11:00 AM Medical Record Number: [id number] Patient Account Number: [id number] Date of Birth/Sex: 1941/07/11 (74 y.o. Female) Treating RN: Curtis Sites Primary Care Physician: Idelle Leech Other Clinician: Referring Physician: Idelle Leech Treating Physician/Extender: Elayne Snare in Treatment: 11 Vital Signs Height(in): 66 Pulse(bpm): 64 Weight(lbs): 195 Blood Pressure 137/51 (mmHg): Body Mass Index(BMI): 31 Temperature(F): 98.3 Respiratory Rate 16 (breaths/min): Photos: [1:No Photos] [N/A:N/A] Wound Location: [1:Right Toe Great - Lateral N/A] Wounding Event: [1:Gradually Appeared] [N/A:N/A] Primary Etiology: [1:Diabetic Wound/Ulcer of N/A the Lower Extremity] Comorbid History: [1:Cataracts, Glaucoma, Peripheral Venous Disease, Type II Diabetes, Osteoarthritis, Osteomyelitis, Neuropathy] [N/A:N/A] Date Acquired: [1:05/28/2015] [N/A:N/A] Weeks of Treatment: [1:11] [N/A:N/A] Wound Status: [1:Open] [N/A:N/A] Measurements L x W x D 0.5x0.5x0.2 [N/A:N/A] (cm) Area (cm) : [1:0.196] [N/A:N/A] Volume (cm) : [1:0.039] [N/A:N/A] % Reduction in Area: [1:97.90%] [N/A:N/A] % Reduction in Volume: 98.60% [N/A:N/A] Classification: [1:Grade 2] [N/A:N/A] Exudate Amount: [1:Medium] [N/A:N/A] Exudate  Type: [1:Purulent] [N/A:N/A] Exudate Color: [1:yellow, brown, green] [N/A:N/A] Wound Margin: [1:Indistinct, nonvisible] [N/A:N/A] Granulation Amount: [1:Large (67-100%)] [N/A:N/A] Granulation Quality: [1:Red] [N/A:N/A] Necrotic Amount: [1:Small (1-33%)] [N/A:N/A] Exposed Structures: [1:Fascia: Yes Fat: No Tendon: No] [N/A:N/A] Muscle: No Joint: No Bone: No Epithelialization: None N/A N/A Periwound Skin Texture: Callus: Yes N/A N/A Edema: No Excoriation: No Induration: No Crepitus: No Fluctuance: No Friable: No Rash: No Scarring: No Periwound Skin Dry/Scaly: Yes N/A N/A Moisture: Maceration: No Moist: No Periwound Skin Color: Atrophie Blanche: No N/A N/A Cyanosis: No Ecchymosis: No Erythema: No Hemosiderin Staining: No Mottled: No Pallor: No Rubor: No Tenderness on Yes N/A N/A Palpation: Wound Preparation: Ulcer Cleansing: N/A  N/A Rinsed/Irrigated with Saline Topical Anesthetic Applied: Other: lidocaine 4% Treatment Notes Electronic Signature(s) Signed: 09/11/2015 5:41:48 PM By: Curtis Sitesorthy, Joanna Entered By: Curtis Sitesorthy, Joanna on 09/11/2015 11:35:07 Makris, Azlynn ([id number]) -------------------------------------------------------------------------------- Multi-Disciplinary Care Plan Details Patient Name: [name] SpittleLLOYD, Shadai Date of Service: 09/11/2015 11:00 AM Medical Record Number: [id number] Patient Account Number: [id number] Date of Birth/Sex: 1940/12/02 (74 y.o. Female) Treating RN: Curtis Sitesorthy, Joanna Primary Care Physician: Idelle LeechKWASCHYN, KATIE Other Clinician: Referring Physician: Idelle LeechKWASCHYN, KATIE Treating Physician/Extender: Elayne SnarePARKER, PETER Weeks in Treatment: 11 Active Inactive Abuse / Safety / Falls / Self Care Management Nursing Diagnoses: Impaired physical mobility Potential for falls Goals: Patient will remain injury free Date Initiated: 06/21/2015 Goal Status: Active Interventions: Assess fall risk on admission and as needed Notes: Nutrition Nursing Diagnoses: Impaired glucose control: actual or potential Goals: Patient/caregiver verbalizes understanding of need to maintain therapeutic glucose control per primary care physician Date Initiated: 06/21/2015 Goal Status: Active Interventions: Provide education on elevated blood sugars and impact on wound healing Notes: Orientation to the Wound Care Program Nursing Diagnoses: Knowledge deficit related to the wound healing center program GoalsAlben [name]: Gotay, Alexxa ([id number]) Patient/caregiver will verbalize understanding of the Wound Healing Center Program Date Initiated: 06/21/2015 Goal Status: Active Interventions: Provide education on orientation to the wound center Notes: Soft Tissue Infection Nursing Diagnoses: Impaired tissue integrity Potential for infection: soft tissue Goals: Patient's soft tissue infection will resolve Date Initiated:  06/21/2015 Goal Status: Active Interventions: Assess signs and symptoms of infection every visit Treatment Activities: Culture and sensitivity : 09/11/2015 Systemic antibiotics : 09/11/2015 Notes: Wound/Skin Impairment Nursing Diagnoses: Impaired tissue integrity Goals: Ulcer/skin breakdown will heal within 14 weeks Date Initiated: 06/21/2015 Goal Status: Active Interventions: Assess ulceration(s) every visit Treatment Activities: Topical wound management initiated : 09/11/2015 Notes: [name] SpittleLLOYD, Maleiya ([id number]) Electronic Signature(s) Signed: 09/11/2015 5:41:48 PM By: Curtis Sitesorthy, Joanna Entered By: Curtis Sitesorthy, Joanna on 09/11/2015 11:35:00 Kutscher, Shanekia ([id number]) -------------------------------------------------------------------------------- Patient/Caregiver Education Details Patient Name: [name] SpittleLLOYD, Kyrsten Date of Service: 09/11/2015 11:00 AM Medical Record Number: [id number] Patient Account Number: [id number] Date of Birth/Gender: 1940/12/02 (74 y.o. Female) Treating RN: Curtis Sitesorthy, Joanna Primary Care Physician: Idelle LeechKWASCHYN, KATIE Other Clinician: Referring Physician: Idelle LeechKWASCHYN, KATIE Treating Physician/Extender: Elayne SnarePARKER, PETER Weeks in Treatment: 11 Education Assessment Education Provided To: Patient Education Topics Provided Wound/Skin Impairment: Handouts: Other: wound care to continue as ordered Methods: Explain/Verbal Responses: State content correctly Electronic Signature(s) Signed: 09/11/2015 12:10:44 PM By: Curtis Sitesorthy, Joanna Previous Signature: 09/11/2015 12:07:54 PM Version By: Ardath SaxParker, Peter MD Entered By: Curtis Sitesorthy, Joanna on 09/11/2015 12:10:43 Stallsmith, Renley ([id number]) -------------------------------------------------------------------------------- Wound Assessment Details Patient Name: [name] SpittleLLOYD, Aireana Date of Service: 09/11/2015 11:00 AM Medical Record Number: [id number] Patient Account Number: [id number] Date of Birth/Sex: 1940/12/02 (74 y.o. Female) Treating RN:  Curtis Sitesorthy, Joanna Primary Care Physician: Idelle LeechKWASCHYN, KATIE Other Clinician: Referring Physician: Idelle LeechKWASCHYN, KATIE Treating Physician/Extender: Elayne SnarePARKER, PETER Weeks in Treatment: 11 Wound Status Wound Number: 1  Primary Diabetic Wound/Ulcer of the Lower Etiology: Extremity Wound Location: Right Toe Great - Lateral Wound Open Wounding Event: Gradually Appeared Status: Date Acquired: 05/28/2015 Comorbid Cataracts, Glaucoma, Peripheral Weeks Of Treatment: 11 History: Venous Disease, Type II Diabetes, Clustered Wound: No Osteoarthritis, Osteomyelitis, Neuropathy Photos Photo Uploaded By: Curtis Sites on 09/11/2015 12:15:24 Wound Measurements Length: (cm) 0.5 Width: (cm) 0.5 Depth: (cm) 0.2 Area: (cm) 0.196 Volume: (cm) 0.039 % Reduction in Area: 97.9% % Reduction in Volume: 98.6% Epithelialization: None Tunneling: No Undermining: No Wound Description Classification: Grade 2 Wound Margin: Indistinct, nonvisible Exudate Amount: Medium Exudate Type: Purulent Exudate Color: yellow, brown, green Foul Odor After Cleansing: No Wound Bed Granulation Amount: Large (67-100%) Exposed Structure Granulation Quality: Red Fascia Exposed: Yes Necrotic Amount: Small (1-33%) Fat Layer Exposed: No [place]ee, Kenecia ([id number]) Necrotic Quality: Adherent Slough Tendon Exposed: No Muscle Exposed: No Joint Exposed: No Bone Exposed: No Periwound Skin Texture Texture Color No Abnormalities Noted: No No Abnormalities Noted: No Callus: Yes Atrophie Blanche: No Crepitus: No Cyanosis: No Excoriation: No Ecchymosis: No Fluctuance: No Erythema: No Friable: No Hemosiderin Staining: No Induration: No Mottled: No Localized Edema: No Pallor: No Rash: No Rubor: No Scarring: No Temperature / Pain Moisture Tenderness on Palpation: Yes No Abnormalities Noted: No Dry / Scaly: Yes Maceration: No Moist: No Wound Preparation Ulcer Cleansing: Rinsed/Irrigated with Saline Topical Anesthetic  Applied: Other: lidocaine 4%, Treatment Notes Wound #1 (Right, Lateral Toe Great) 1. Cleansed with: Clean wound with Normal Saline 2. Anesthetic Topical Lidocaine 4% cream to wound bed prior to debridement 4. Dressing Applied: Santyl Ointment 5. Secondary Dressing Applied Gauze and Kerlix/Conform 7. 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 Female) Treating RN: Curtis Sites Primary Care Physician: Idelle Leech Other Clinician: Referring Physician: Idelle Leech Treating Physician/Extender: Elayne Snare in Treatment: 11 Vital Signs Time Taken: 11:30 Temperature (F): 98.3 Height (in): 66 Pulse (bpm): 64 Weight (lbs): 195 Respiratory Rate (breaths/min): 16 Body Mass Index (BMI): 31.5 Blood Pressure (mmHg): 137/51 Reference Range: 80 - 120 mg / dl Electronic Signature(s) Signed: 09/11/2015 5:41:48 PM By: Curtis Sites Entered By: Curtis Sites on 09/11/2015 11:30:16

## 2015-09-13 NOTE — Progress Notes (Signed)
XENIA, NILE ([id number]) Visit Report for 09/11/2015 Chief Complaint Document Details Patient Name: [name] [name], [name] [name] 09/11/2015 11:00 Date of Service: AM Medical Record [id number] Number: Patient Account Number: [id number] [birth year] (74 y.o. Treating RN: Curtis Sites Date of Birth/Sex: Female) Other Clinician: Primary Care Physician: Lenoard Aden, Moranda Billiot Referring Physician: Idelle Leech Physician/Extender: Weeks in Treatment: 11 Information Obtained from: Patient Chief Complaint Patient presents to the wound care center for a consult due non healing wound. the patient came last week to the wound care center with a ulcerated area on the medial part of her right foot which she's had for about 6 weeks now. Electronic Signature(s) Signed: 09/11/2015 12:04:59 PM By: Ardath Sax MD Entered By: Ardath Sax on 09/11/2015 12:04:59 Ulrey, Doshia ([id number]) -------------------------------------------------------------------------------- Debridement Details Patient Name: [name], [name] 09/11/2015 11:00 Date of Service: AM Medical Record [id number] Number: Patient Account Number: [id number] 03-11-1941 (74 y.o. Treating RN: Curtis Sites Date of Birth/Sex: Female) Other Clinician: Primary Care Physician: Lenoard Aden, Don Giarrusso Referring Physician: Idelle Leech Physician/Extender: Weeks in Treatment: 11 Debridement Performed for Wound #1 Right,Lateral Toe Great Assessment: Performed By: Physician Ardath Sax, MD Debridement: Debridement Pre-procedure Yes Verification/Time Out Taken: Start Time: 11:39 Pain Control: Lidocaine 4% Topical Solution Level: Skin/Subcutaneous Tissue Total Area Debrided (L x 0.5 (cm) x 0.5 (cm) = 0.25 (cm) W): Tissue and other Viable, Non-Viable, Callus, Fibrin/Slough, Subcutaneous material debrided: Instrument: Curette, Scissors Bleeding: Moderate Hemostasis Achieved: Silver Nitrate End Time:  11:41 Procedural Pain: 0 Post Procedural Pain: 0 Response to Treatment: Procedure was tolerated well Post Debridement Measurements of Total Wound Length: (cm) 0.8 Width: (cm) 0.8 Depth: (cm) 0.2 Volume: (cm) 0.101 Post Procedure Diagnosis Same as Pre-procedure Electronic Signature(s) Signed: 09/11/2015 5:41:48 PM By: Curtis Sites Signed: 09/13/2015 8:37:21 AM By: Ardath Sax MD Entered By: Curtis Sites on 09/11/2015 11:40:54 Bechtold, Kayler ([id number]) -------------------------------------------------------------------------------- HPI Details Patient Name: [name], [name] 09/11/2015 11:00 Date of Service: AM Medical Record [id number] Number: Patient Account Number: [id number] 1940/12/09 (74 y.o. Treating RN: Curtis Sites Date of Birth/Sex: Female) Other Clinician: Primary Care Physician: Lenoard Aden, Norris Brumbach Referring Physician: Idelle Leech Physician/Extender: Weeks in Treatment: 11 History of Present Illness Location: wound on the medial part of her right forefoot near the first metatarsal Quality: Patient reports experiencing a dull pain to affected area(s). Severity: Patient states wound are getting worse. Duration: Patient has had the wound for < 6 weeks prior to presenting for treatment Timing: Pain in wound is Intermittent (comes and goes Context: The wound appeared gradually over time Modifying Factors: Consults to this date include: she was seen by her PCP but there is no evidence of any definite workup with x-rays. I understand she was advised IV ciprofloxacin and clindamycin but the patient refused Associated Signs and Symptoms: Patient reports having difficulty standing for long periods. HPI Description: 74 year old patient who is alert and oriented and comes to see [place] with a history of having a wound on the medial part of her right forefoot which she's had for about 6 weeks. she has a past medical history of diabetes mellitus with  peripheral angiopathy without gangrene, allergic rhinitis, hypertensive heart disease, glaucoma, chronic venous insufficiency, diverticulitis and asthma. the patient has never smoked cigarettes. I understand an x-ray may be done of the right foot and we will await this report prior to ordering further tests. 07/12/2015-- x-ray of the right foot done on 07/11/2015 which was compared to the one done on 06/20/2015 shows no acute fracture or dislocation on the x-ray.  07/27/2015 -- the patient has not been here to see [place] for 2 weeks and in the meanwhile she has not yet seen a podiatrist. 08/06/2015 -- the patient did see a podiatrist and we have not gotten any official notes but she says that he trimmed her nails and gave her a offloading shoe which she was asked to wear. He has not recommended any surgical debridement. Electronic Signature(s) Signed: 09/11/2015 12:05:11 PM By: Ardath Sax MD Entered By: Ardath Sax on 09/11/2015 12:05:11 DEMAYA, HARDGE ([id number]) -------------------------------------------------------------------------------- Physical Exam Details Patient Name: [name], [name] 09/11/2015 11:00 Date of Service: AM Medical Record [id number] Number: Patient Account Number: [id number] 23-Jan-1941 (74 y.o. Treating RN: Curtis Sites Date of Birth/Sex: Female) Other Clinician: Primary Care Physician: Lenoard Aden, Gonzalo Waymire Referring Physician: Idelle Leech Physician/Extender: Weeks in Treatment: 11 Electronic Signature(s) Signed: 09/11/2015 12:05:24 PM By: Ardath Sax MD Entered By: Ardath Sax on 09/11/2015 12:05:24 TRINETTE, VERA ([id number]) -------------------------------------------------------------------------------- Physician Orders Details Patient Name: [name], [name] 09/11/2015 11:00 Date of Service: AM Medical Record [id number] Number: Patient Account Number: [id number] 1941-03-20 (74 y.o. Treating RN: Curtis Sites Date of  Birth/Sex: Female) Other Clinician: Primary Care Physician: Lenoard Aden, Cuyler Vandyken Referring Physician: Idelle Leech Physician/Extender: Weeks in Treatment: 11 Verbal / Phone Orders: Yes Clinician: Curtis Sites Read Back and Verified: Yes Diagnosis Coding Wound Cleansing Wound #1 Right,Lateral Toe Great o Cleanse wound with mild soap and water Anesthetic Wound #1 Right,Lateral Toe Great o Topical Lidocaine 4% cream applied to wound bed prior to debridement Skin Barriers/Peri-Wound Care Wound #1 Right,Lateral Toe Great o Skin Prep Primary Wound Dressing Wound #1 Right,Lateral Toe Great o Santyl Ointment - apply just enough to wound bed only, not on healthy skin. Secondary Dressing o Conform/Kerlix Wound #1 Right,Lateral Toe Great o Gauze and Kerlix/Conform Dressing Change Frequency Wound #1 Right,Lateral Toe Great o Change Dressing Monday, Wednesday, Friday Follow-up Appointments Wound #1 Right,Lateral Toe Great o Return Appointment in 1 week. Off-Loading Wound #1 Right,Lateral Toe Great o Other: - Darco shoe recommended by podiatry Klahn, Izabella ([id number]) Electronic Signature(s) Signed: 09/11/2015 5:41:48 PM By: Curtis Sites Signed: 09/13/2015 8:37:21 AM By: Ardath Sax MD Entered By: Curtis Sites on 09/11/2015 11:41:59 Flanagan, Karlee ([id number]) -------------------------------------------------------------------------------- Problem List Details Patient Name: SHAMERE, DILWORTH 09/11/2015 11:00 Date of Service: AM Medical Record [id number] Number: Patient Account Number: [id number] 10-08-1940 (74 y.o. 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 Treating RN: Curtis Sites Date of Birth/Sex: Female) Other Clinician: Primary Care Physician: Lenoard Aden, Steffi Noviello Referring Physician: Idelle Leech Physician/Extender: Weeks in Treatment: 11 Subjective Chief Complaint Information obtained from Patient Patient presents to the wound care center for a consult due non healing wound. the patient came last week to the wound care center with a ulcerated area on the medial part of her right foot which she's had for about 6 weeks now. History of Present Illness (HPI) The  following HPI elements were documented for the patient's wound: Location: wound on the medial part of her right forefoot near the first metatarsal Quality: Patient reports experiencing a dull pain to affected area(s). Severity: Patient states wound are getting worse. Duration: Patient has had the wound for < 6 weeks prior to presenting for treatment Timing: Pain in wound is Intermittent (comes and goes Context: The wound appeared gradually over time Modifying Factors: Consults to this date include: she was seen by her PCP but there is no evidence of any definite workup with x-rays. I understand she was advised IV ciprofloxacin and clindamycin but the patient refused Associated Signs and Symptoms: Patient reports having difficulty standing for long periods. 74 year old  patient who is alert and oriented and comes to see [place] with a history of having a wound on the medial part of her right forefoot which she's had for about 6 weeks. she has a past medical history of diabetes mellitus with peripheral angiopathy without gangrene, allergic rhinitis, hypertensive heart disease, glaucoma, chronic venous insufficiency, diverticulitis and asthma. the patient has never smoked cigarettes. I understand an x-ray may be done of the right foot and we will await this report prior to ordering further tests. 07/12/2015-- x-ray of the right foot done on 07/11/2015 which was compared to the one done on 06/20/2015 shows no acute fracture or dislocation on the x-ray. 07/27/2015 -- the patient has not been here to see [place] for 2 weeks and in the meanwhile she has not yet seen a podiatrist. 08/06/2015 -- the patient did see a podiatrist and we have not gotten any official notes but she says that he trimmed her nails and gave her a offloading shoe which she was asked to wear. He has not recommended any surgical debridement. LAVER, Yanelie ([id number]) Objective Constitutional Vitals Time Taken: 11:30 AM, Height: 66 in, Weight: 195 lbs, BMI: 31.5, Temperature: 98.3 F, Pulse: 64 bpm, Respiratory Rate: 16 breaths/min, Blood Pressure: 137/51 mmHg. Integumentary (Hair, Skin) Wound #1 status is Open. Original cause of wound was Gradually Appeared. The wound is located on the [employer]. The wound measures 0.5cm length x 0.5cm width x 0.2cm depth; 0.196cm^2 area and 0.039cm^3 volume. There is fascia exposed. There is no tunneling or undermining noted. There is a medium amount of purulent drainage noted. The wound margin is indistinct and nonvisible. There is large (67-100%) red granulation within the wound bed. There is a small (1-33%) amount of necrotic tissue within the wound bed including Adherent Slough. The periwound skin appearance exhibited: Callus, Dry/Scaly. The  periwound skin appearance did not exhibit: Crepitus, Excoriation, Fluctuance, Friable, Induration, Localized Edema, Rash, Scarring, Maceration, Moist, Atrophie Blanche, Cyanosis, Ecchymosis, Hemosiderin Staining, Mottled, Pallor, Rubor, Erythema. The periwound has tenderness on palpation. 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 There was a Skin/Subcutaneous Tissue Debridement ([id number]) debridement with total area of 0.25 sq cm performed by Ardath Sax, MD. with the following instrument(s): Curette and Scissors to remove Viable and Non-Viable tissue/material including Fibrin/Slough, Callus, and Subcutaneous after achieving pain control using Lidocaine 4% Topical Solution. A time out was conducted prior to the start of the procedure. A Moderate amount of bleeding was controlled with Silver Nitrate. The procedure was tolerated Pulido, Saretta ([id number]) well with a pain level  of 0 throughout and a pain level of 0 following the procedure. Post Debridement Measurements: 0.8cm length x 0.8cm width x 0.2cm depth; 0.101cm^3 volume. Post procedure Diagnosis Wound #1: Same as Pre-Procedure Plan Wound Cleansing: Wound #1 Right,Lateral Toe Great: Cleanse wound with mild soap and water Anesthetic: Wound #1 Right,Lateral Toe Great: Topical Lidocaine 4% cream applied to wound bed prior to debridement Skin Barriers/Peri-Wound Care: Wound #1 Right,Lateral Toe Great: Skin Prep Primary Wound Dressing: Wound #1 Right,Lateral Toe Great: Santyl Ointment - apply just enough to wound bed only, not on healthy skin. Secondary Dressing: Conform/Kerlix Wound #1 Right,Lateral Toe Great: Gauze and Kerlix/Conform Dressing Change Frequency: Wound #1  Right,Lateral Toe Great: Change Dressing Monday, Wednesday, Friday Follow-up Appointments: Wound #1 Right,Lateral Toe Great: Return Appointment in 1 week. Off-Loading: Wound #1 Right,Lateral Toe Great: Other: - Darco shoe recommended by podiatry Follow-Up Appointments: A Patient Clinical Summary of Care was provided to ML Debrided ulcer. Treating with Santyl daily Alben SpittleLLOYD, Yanelle ([id number]) Electronic Signature(s) Signed: 09/11/2015 12:06:34 PM By: Ardath SaxParker, Franchesca Veneziano MD Entered By: Ardath SaxParker, Vin Yonke on 09/11/2015 12:06:34 Alben SpittleLLOYD, Ara ([id number]) -------------------------------------------------------------------------------- SuperBill Details Patient Name: Alben SpittleLLOYD, Emmalene Date of Service: 09/11/2015 Medical Record Number: [id number] Patient Account Number: [id number] Date of Birth/Sex: 11-17-1940 (74 y.o. Female) Treating RN: Curtis Sitesorthy, Joanna Primary Care Physician: Idelle LeechKWASCHYN, KATIE Other Clinician: Referring Physician: Idelle LeechKWASCHYN, KATIE Treating Physician/Extender: Elayne SnarePARKER, Renessa Wellnitz Weeks in Treatment: 11 Diagnosis Coding ICD-10 Codes Code Description E11.621 Type 2 diabetes mellitus with foot ulcer L97.513 Non-pressure chronic ulcer of other part of right foot with necrosis of muscle Facility Procedures CPT4: Description Modifier Quantity Code [id number] 11042 - DEB SUBQ TISSUE 20 SQ CM/< 1 ICD-10 Description Diagnosis L97.513 Non-pressure chronic ulcer of other part of right foot with necrosis of muscle Physician Procedures CPT4: Description Modifier Quantity Code [id number] 99213 - WC PHYS LEVEL 3 - EST PT 1 ICD-10 Description Diagnosis E11.621 Type 2 diabetes mellitus with foot ulcer CPT4: [id number] 11042 - WC PHYS SUBQ TISS 20 SQ CM 1 ICD-10 Description Diagnosis L97.513 Non-pressure chronic ulcer of other part of right foot with necrosis of muscle Electronic Signature(s) Signed: 09/11/2015 12:07:10 PM By: Ardath SaxParker, Jeannette Maddy MD Entered By: Ardath SaxParker, Damein Gaunce on 09/11/2015 12:07:10

## 2015-09-21 ENCOUNTER — Ambulatory Visit: Payer: Medicare (Managed Care) | Admitting: Surgery

## 2015-09-28 ENCOUNTER — Encounter: Payer: Medicare (Managed Care) | Attending: Surgery | Admitting: Surgery

## 2015-09-28 DIAGNOSIS — I872 Venous insufficiency (chronic) (peripheral): Secondary | ICD-10-CM | POA: Diagnosis not present

## 2015-09-28 DIAGNOSIS — K5792 Diverticulitis of intestine, part unspecified, without perforation or abscess without bleeding: Secondary | ICD-10-CM | POA: Insufficient documentation

## 2015-09-28 DIAGNOSIS — J45909 Unspecified asthma, uncomplicated: Secondary | ICD-10-CM | POA: Insufficient documentation

## 2015-09-28 DIAGNOSIS — H409 Unspecified glaucoma: Secondary | ICD-10-CM | POA: Insufficient documentation

## 2015-09-28 DIAGNOSIS — I119 Hypertensive heart disease without heart failure: Secondary | ICD-10-CM | POA: Diagnosis not present

## 2015-09-28 DIAGNOSIS — E11621 Type 2 diabetes mellitus with foot ulcer: Secondary | ICD-10-CM | POA: Diagnosis present

## 2015-09-28 DIAGNOSIS — L97513 Non-pressure chronic ulcer of other part of right foot with necrosis of muscle: Secondary | ICD-10-CM | POA: Diagnosis not present

## 2015-09-28 DIAGNOSIS — E1151 Type 2 diabetes mellitus with diabetic peripheral angiopathy without gangrene: Secondary | ICD-10-CM | POA: Insufficient documentation

## 2015-09-29 NOTE — Progress Notes (Addendum)
[name] [name], Sarahanne ([id number]) Visit Report for 09/28/2015 Arrival Information Details Patient Name: [name] [name], [name] [name] Date of Service: 09/28/2015 10:00 AM Medical Record Number: [id number] Patient Account Number: [id number] Date of Birth/Sex: 04-14-1941 (75 y.o. Female) Treating RN: Afful, RN, BSN, [place] Sinkita Primary Care Physician: Idelle LeechKWASCHYN, KATIE Other Clinician: Referring Physician: Idelle LeechKWASCHYN, KATIE Treating Physician/Extender: Rudene ReBritto, Errol Weeks in Treatment: 14 Visit Information History Since Last Visit Added or deleted any medications: No Patient Arrived: Walker Any new allergies or adverse reactions: No Arrival Time: 09:46 Had a fall or experienced change in No Accompanied By: self activities of daily living that may affect Transfer Assistance: None risk of falls: Patient Identification Verified: Yes Signs or symptoms of abuse/neglect since last No Secondary Verification Process Yes visito Completed: Hospitalized since last visit: No Patient Requires Transmission- No Has Dressing in Place as Prescribed: Yes Based Precautions: Pain Present Now: No Patient Has Alerts: Yes Patient Alerts: Patient on Blood Thinner Type II Diabetic 81mg  Aspirin Electronic Signature(s) Signed: 09/28/2015 2:53:54 PM By: Elpidio EricAfful, Rita BSN, RN Entered By: Elpidio EricAfful, Rita on 09/28/2015 09:46:24 Torian, Madinah ([id number]) -------------------------------------------------------------------------------- Encounter Discharge Information Details Patient Name: [name] [name], [name] [name] Date of Service: 09/28/2015 10:00 AM Medical Record Number: [id number] Patient Account Number: [id number] Date of Birth/Sex: 04-14-1941 (75 y.o. Female) Treating RN: Afful, RN, BSN, [place] Primary Care Physician: Idelle LeechKWASCHYN, KATIE Other Clinician: Referring Physician: Idelle LeechKWASCHYN, KATIE Treating Physician/Extender: Rudene ReBritto, Errol Weeks in Treatment: 14 Encounter Discharge Information Items Discharge Pain Level: 0 Discharge Condition:  Stable Ambulatory Status: Walker Discharge Destination: Home Transportation: Other Accompanied By: self Schedule Follow-up Appointment: No Medication Reconciliation completed No and provided to Patient/Care Aretta Stetzel: Provided on Clinical Summary of Care: 09/28/2015 Form Type Recipient Paper Patient ML Electronic Signature(s) Signed: 09/28/2015 10:13:07 AM By: Gwenlyn PerkingMoore, Shelia Entered By: Gwenlyn PerkingMoore, Shelia on 09/28/2015 10:13:07 Tallman, Halla ([id number]) -------------------------------------------------------------------------------- Lower Extremity Assessment Details Patient Name: [name] [name], [name] [name] Date of Service: 09/28/2015 10:00 AM Medical Record Number: [id number] Patient Account Number: [id number] Date of Birth/Sex: 04-14-1941 (75 y.o. Female) Treating RN: Afful, RN, BSN, [place] Primary Care Physician: Idelle LeechKWASCHYN, KATIE Other Clinician: Referring Physician: Idelle LeechKWASCHYN, KATIE Treating Physician/Extender: Rudene ReBritto, Errol Weeks in Treatment: 14 Edema Assessment Assessed: [Left: No] Franne Forts[Right: No] Edema: [Left: Ye] [Right: s] Vascular Assessment Pulses: Posterior Tibial Dorsalis Pedis Palpable: [Right:Yes] Extremity colors, hair growth, and conditions: Extremity Color: [Right:Hyperpigmented] Hair Growth on Extremity: [Right:No] Temperature of Extremity: [Right:Warm] Capillary Refill: [Right:< 3 seconds] Toe Nail Assessment Left: Right: Thick: Yes Discolored: Yes Deformed: No Improper Length and Hygiene: No Electronic Signature(s) Signed: 09/28/2015 2:53:54 PM By: Elpidio EricAfful, Rita BSN, RN Entered By: Elpidio EricAfful, Rita on 09/28/2015 09:47:07 Pickering, Genie ([id number]) -------------------------------------------------------------------------------- Multi Wound Chart Details Patient Name: [name] [name], [name] [name] Date of Service: 09/28/2015 10:00 AM Medical Record Number: [id number] Patient Account Number: [id number] Date of Birth/Sex: 04-14-1941 (75 y.o. Female) Treating RN: Clover MealyAfful, RN, BSN,  [place] Primary Care  Physician: Idelle LeechKWASCHYN, KATIE Other Clinician: Referring Physician: Idelle LeechKWASCHYN, KATIE Treating Physician/Extender: Rudene ReBritto, Errol Weeks in Treatment: 14 Vital Signs Height(in): 66 Pulse(bpm): 68 Weight(lbs): 195 Blood Pressure 132/60 (mmHg): Body Mass Index(BMI): 31 Temperature(F): 97.9 Respiratory Rate 17 (breaths/min): Photos: [1:No Photos] [N/A:N/A] Wound Location: [1:Right Toe Great - Lateral N/A] Wounding Event: [1:Gradually Appeared] [N/A:N/A] Primary Etiology: [1:Diabetic Wound/Ulcer of N/A the Lower Extremity] Comorbid History: [1:Cataracts, Glaucoma, Peripheral Venous Disease, Type II Diabetes, Osteoarthritis, Osteomyelitis, Neuropathy] [N/A:N/A] Date Acquired: [1:05/28/2015] [N/A:N/A] Weeks of Treatment: [1:14] [N/A:N/A] Wound Status: [1:Open] [N/A:N/A] Measurements L x W x D 1.5x1.3x0.2 [N/A:N/A] (cm) Area (cm) : [1:1.532] [N/A:N/A] Volume (cm) : [1:0.306] [N/A:N/A] % Reduction in Area: [1:83.70%] [N/A:N/A] %  Reduction in Volume: 89.20% [N/A:N/A] Classification: [1:Grade 2] [N/A:N/A] Exudate Amount: [1:None Present] [N/A:N/A] Wound Margin: [1:Indistinct, nonvisible] [N/A:N/A] Granulation Amount: [1:Large (67-100%)] [N/A:N/A] Granulation Quality: [1:Red] [N/A:N/A] Necrotic Amount: [1:Small (1-33%)] [N/A:N/A] Necrotic Tissue: [1:Eschar] [N/A:N/A] Exposed Structures: [1:Fascia: Yes Fat: No Tendon: No Muscle: No] [N/A:N/A] Joint: No Bone: No Epithelialization: None N/A N/A Periwound Skin Texture: Callus: Yes N/A N/A Edema: No Excoriation: No Induration: No Crepitus: No Fluctuance: No Friable: No Rash: No Scarring: No Periwound Skin Dry/Scaly: Yes N/A N/A Moisture: Maceration: No Moist: No Periwound Skin Color: Atrophie Blanche: No N/A N/A Cyanosis: No Ecchymosis: No Erythema: No Hemosiderin Staining: No Mottled: No Pallor: No Rubor: No Temperature: No Abnormality N/A N/A Tenderness on No N/A N/A Palpation: Wound Preparation: Ulcer Cleansing:  N/A N/A Rinsed/Irrigated with Saline Topical Anesthetic Applied: Other: lidocaine 4% Treatment Notes Electronic Signature(s) Signed: 09/28/2015 9:55:44 AM By: Elpidio Eric BSN, RN Entered By: Elpidio Eric on 09/28/2015 09:55:43 Dupee, Lihanna ([id number]) -------------------------------------------------------------------------------- Multi-Disciplinary Care Plan Details Patient Name: [name] Spittle Date of Service: 09/28/2015 10:00 AM Medical Record Number: [id number] Patient Account Number: [id number] Date of Birth/Sex: 24-Jun-1941 (74 y.o. Female) Treating RN: Clover Mealy, RN, BSN, [place] Sink Primary Care Physician: Idelle Leech Other Clinician: Referring Physician: Idelle Leech Treating Physician/Extender: Rudene Re in Treatment: 14 Active Inactive Electronic Signature(s) Signed: 11/15/2015 11:37:16 AM By: Elpidio Eric BSN, RN Previous Signature: 09/28/2015 9:55:36 AM Version By: Elpidio Eric BSN, RN Entered By: Elpidio Eric on 11/15/2015 11:37:15 Klepper, Rhys ([id number]) -------------------------------------------------------------------------------- Pain Assessment Details Patient Name: [name] Spittle Date of Service: 09/28/2015 10:00 AM Medical Record Number: [id number] Patient Account Number: [id number] Date of Birth/Sex: Aug 18, 1941 (75 y.o. Female) Treating RN: Clover Mealy, RN, BSN, [place] Sink Primary Care Physician: Idelle Leech Other Clinician: Referring Physician: Idelle Leech Treating Physician/Extender: Rudene Re in Treatment: 14 Active Problems Location of Pain Severity and Description of Pain Patient Has Paino No Site Locations Pain Management and Medication Current Pain Management: Electronic Signature(s) Signed: 09/28/2015 2:53:54 PM By: Elpidio Eric BSN, RN Entered By: Elpidio Eric on 09/28/2015 09:49:50 Aguallo, Dody ([id number]) -------------------------------------------------------------------------------- Patient/Caregiver Education Details Patient  Name: [name] Spittle Date of Service: 09/28/2015 10:00 AM Medical Record Number: [id number] Patient Account Number: [id number] Date of Birth/Gender: 1941-05-08 (74 y.o. Female) Treating RN: Clover Mealy, RN, BSN, [place] Primary Care Physician: Idelle Leech Other Clinician: Referring Physician: Idelle Leech Treating Physician/Extender: Rudene Re in Treatment: 14 Education Assessment Education Provided To: Patient Education Topics Provided Elevated Blood Sugar/ Impact on Healing: Methods: Explain/Verbal Responses: State content correctly [facility]: Methods: Explain/Verbal Responses: State content correctly Electronic Signature(s) Signed: 09/28/2015 2:53:54 PM By: Elpidio Eric BSN, RN Entered By: Elpidio Eric on 09/28/2015 10:08:04 [name] Spittle ([id number]) -------------------------------------------------------------------------------- Wound Assessment Details Patient Name: [name] Spittle Date of Service: 09/28/2015 10:00 AM Medical Record Number: [id number] Patient Account Number: [id number] Date of Birth/Sex: 10-08-1940 (74 y.o. Female) Treating RN: Afful, RN, BSN, [place] Sink Primary Care Physician: Idelle Leech Other Clinician: Referring Physician: Idelle Leech Treating Physician/Extender: Rudene Re in Treatment: 14 Wound Status Wound Number: 1 Primary Diabetic Wound/Ulcer of the Lower Etiology: Extremity Wound Location: Right Toe Great - Lateral Wound Open Wounding Event: Gradually Appeared Status: Date Acquired: 05/28/2015 Comorbid Cataracts, Glaucoma, Peripheral Weeks Of Treatment: 14 History: Venous Disease, Type II Diabetes, Clustered Wound: No Osteoarthritis, Osteomyelitis, Neuropathy Photos Wound Measurements Length: (cm) 1.5 Width: (cm) 1.3 Depth: (cm) 0.2 Area: (cm) 1.532 Volume: (cm) 0.306 % Reduction in Area: 83.7% % Reduction in Volume: 89.2% Epithelialization: None Tunneling: No Undermining: No Wound  Description  Classification: Grade 2 Wound Margin: Indistinct, nonvisible Exudate Amount: None Present [place], [place] ([id number]) Foul Odor After Cleansing: No Wound Bed Granulation Amount: Large (67-100%) Exposed Structure Granulation Quality: Red Fascia Exposed: Yes Necrotic Amount: Small (1-33%) Fat Layer Exposed: No Necrotic Quality: Eschar Tendon Exposed: No Muscle Exposed: No Joint Exposed: No Bone Exposed: No Periwound Skin Texture Texture Color No Abnormalities Noted: No No Abnormalities Noted: No Callus: Yes Atrophie Blanche: No Crepitus: No Cyanosis: No Excoriation: No Ecchymosis: No Fluctuance: No Erythema: No Friable: No Hemosiderin Staining: No Induration: No Mottled: No Localized Edema: No Pallor: No Rash: No Rubor: No Scarring: No Temperature / Pain Moisture Temperature: No Abnormality No Abnormalities Noted: No Dry / Scaly: Yes Maceration: No Moist: No Wound Preparation Ulcer Cleansing: Rinsed/Irrigated with Saline Topical Anesthetic Applied: Other: lidocaine 4%, Electronic Signature(s) Signed: 09/28/2015 2:31:45 PM By: Elpidio Eric BSN, RN Entered By: Elpidio Eric on 09/28/2015 14:31:45 Obara, Jevon ([id number]) -------------------------------------------------------------------------------- Vitals Details Patient Name: [name] Spittle Date of Service: 09/28/2015 10:00 AM Medical Record Number: [id number] Patient Account Number: [id number] Date of Birth/Sex: 20-Dec-1940 (75 y.o. Female) Treating RN: Afful, RN, BSN, [place] S[place] Primary Care Physician: Idelle Leech Other Clinician: Referring Physician: Idelle Leech Treating Physician/Extender: Rudene Re in Treatment: 14 Vital Signs Time Taken: 09:48 Temperature (F): 97.9 Height (in): 66 Pulse (bpm): 68 Weight (lbs): 195 Respiratory Rate (breaths/min): 17 Body Mass Index (BMI): 31.5 Blood Pressure (mmHg): 132/60 Reference Range: 80 - 120 mg / dl Electronic Signature(s) Signed:  09/28/2015 2:53:54 PM By: Elpidio Eric BSN, RN Entered By: Elpidio Eric on 09/28/2015 09:50:14

## 2015-09-29 NOTE — Progress Notes (Addendum)
[name] [name], [name] [name] ([id number]) Visit Report for 09/28/2015 Chief Complaint Document Details Patient Name: [name] [name], [name] [name] 09/28/2015 10:00 Date of Service: AM Medical Record [id number] Number: Patient Account Number: [id number] 10-23-1940 (75 y.o. Treating RN: Date of Birth/Sex: Female) Other Clinician: Primary Care Physician: Idelle Leech Treating [name] [name] Referring Physician: Idelle Leech Physician/Extender: [name] [name]: 14 Information Obtained from: Patient Chief Complaint Patient presents to the wound care center for a consult due non healing wound. the patient came last week to the wound care center with a ulcerated area on the medial part of her right foot which she's had for about 6 [name] now. Electronic Signature(s) Signed: 09/28/2015 10:06:55 AM By: [name] MD, FACS Entered By: [name] on 09/28/2015 10:06:55 [name] [name], [name] [name] ([id number]) -------------------------------------------------------------------------------- Debridement Details Patient Name: [name] [name], [name] [name] 09/28/2015 10:00 Date of Service: AM Medical Record [id number] Number: Patient Account Number: [id number] [birth year] (74 y.o. Treating RN: Afful, RN, BSN, [place] Sink Date of Birth/Sex: Female) Other Clinician: Primary Care Physician: Idelle Leech Treating [name] [name] Referring Physician: Idelle Leech Physician/Extender: [name] [name]: 14 Debridement Performed for Wound #1 Right,Lateral Toe Great Assessment: Performed By: Physician [name], MD Debridement: Debridement Pre-procedure Yes Verification/Time Out Taken: Start Time: 10:00 Pain Control: Lidocaine 4% Topical Solution Level: Skin/Subcutaneous Tissue Total Area Debrided (L x 1.5 (cm) x 1.3 (cm) = 1.95 (cm) W): Tissue and other Non-Viable, Callus, Exudate, Fibrin/Slough, Subcutaneous material debrided: Instrument: Forceps, Scissors Bleeding: Minimum End Time: 10:05 Procedural Pain: 3 Post Procedural Pain:  3 Response to [name]: Procedure was tolerated well Post Debridement Measurements of Total Wound Length: (cm) 1 Width: (cm) 1 Depth: (cm) 0.2 Volume: (cm) 0.157 Post Procedure Diagnosis Same as Pre-procedure Electronic Signature(s) Signed: 09/28/2015 2:53:54 PM By: Elpidio Eric BSN, RN Signed: 09/28/2015 3:13:05 PM By: [name] MD, FACS Previous Signature: 09/28/2015 10:06:49 AM Version By: [name] MD, FACS Entered By: Elpidio Eric on 09/28/2015 10:07:02 [name] [name], [name] [name] ([id number]) -------------------------------------------------------------------------------- HPI Details Patient Name: [name] [name], [name] [name] 09/28/2015 10:00 Date of Service: AM Medical Record [id number] Number: Patient Account Number: [id number] 1940-10-26 (74 y.o. Treating RN: Date of Birth/Sex: Female) Other Clinician: Primary Care Physician: Idelle Leech Treating [name] [name] Referring Physician: Idelle Leech Physician/Extender: [name] [name]: 14 History of Present Illness Location: wound on the medial part of her right forefoot near the first metatarsal Quality: Patient reports experiencing a dull pain to affected area(s). Severity: Patient states wound are getting worse. Duration: Patient has had the wound for < 6 [name] prior to presenting for [name] Timing: Pain in wound is Intermittent (comes and goes Context: The wound appeared gradually over time Modifying Factors: Consults to this date include: she was seen by her PCP but there is no evidence of any definite workup with x-rays. I understand she was advised IV ciprofloxacin and clindamycin but the patient refused Associated Signs and Symptoms: Patient reports having difficulty standing for long periods. HPI Description: 75 year old patient who is alert and oriented and comes to see [place] with a history of having a wound on the medial part of her right forefoot which she's had for about 6 [name]. she has a past medical history of diabetes  mellitus with peripheral angiopathy without gangrene, allergic rhinitis, hypertensive heart disease, glaucoma, chronic venous insufficiency, diverticulitis and asthma. the patient has never smoked cigarettes. I understand an x-ray may be done of the right foot and we will await this report prior to ordering further tests. 07/12/2015-- x-ray of the right foot done on 07/11/2015 which was compared to the one done on 06/20/2015  shows no acute fracture or dislocation on the x-ray. 07/27/2015 -- the patient has not been here to see [place] for 2 [name] and in the meanwhile she has not yet seen a podiatrist. 08/06/2015 -- the patient did see a podiatrist and we have not gotten any official notes but she says that he trimmed her nails and gave her a offloading shoe which she was asked to wear. He has not recommended any surgical debridement. 09/28/2015 - the patient comes here rather seldom because of transportation issues. The wound care has also been done intermittently by a home health nurses and by [facility]. Electronic Signature(s) Signed: 09/28/2015 10:07:39 AM By: [name] MD, FACS Entered By: [name] on 09/28/2015 10:07:39 [name] [name], [name] [name] ([id number]) -------------------------------------------------------------------------------- Physical Exam Details Patient Name: [name] [name], [name] [name] 09/28/2015 10:00 Date of Service: AM Medical Record [id number] Number: Patient Account Number: [id number] 1941-08-10 (74 y.o. Treating RN: Date of Birth/Sex: Female) Other Clinician: Primary Care Physician: Idelle Leech Treating [name] Referring Physician: Idelle Leech Physician/Extender: [name] [name]: 14 Constitutional . Pulse regular. Respirations normal and unlabored. Afebrile. . Eyes Nonicteric. Reactive to light. Ears, Nose, Mouth, and Throat Lips, teeth, and gums WNL.Marland Kitchen Moist mucosa without lesions. Neck supple and nontender. No palpable supraclavicular or cervical adenopathy.  Normal sized without goiter. Respiratory WNL. No retractions.. Cardiovascular Pedal Pulses WNL. No clubbing, cyanosis or edema. Lymphatic No adneopathy. No adenopathy. No adenopathy. Musculoskeletal Adexa without tenderness or enlargement.. Digits and nails w/o clubbing, cyanosis, infection, petechiae, ischemia, or inflammatory conditions.. Integumentary (Hair, Skin) No suspicious lesions. No crepitus or fluctuance. No peri-wound warmth or erythema. No masses.Marland Kitchen Psychiatric Judgement and insight Intact.. No evidence of depression, anxiety, or agitation.. Notes she has developed a lot of eschar and there is callus formed over the ulceration and I have sharply debrided skin and subcutaneous tissue and surrounding fibrinous debris down to healthy granulation tissue. Forcep and scissors were used and hemostasis was controlled with pressure Electronic Signature(s) Signed: 09/28/2015 10:08:44 AM By: [name] MD, FACS Entered By: [name] on 09/28/2015 10:08:43 [name] [name], [name] [name] ([id number]) -------------------------------------------------------------------------------- Physician Orders Details Patient Name: KETURA, SIREK 09/28/2015 10:00 Date of Service: AM Medical Record [id number] Number: Patient Account Number: [id number] 1941-03-25 (74 y.o. Treating RN: Clover Mealy, RN, BSN,  Sink Date of Birth/Sex: Female) Other Clinician: Primary Care Physician: Monia Sabal Referring Physician: Idelle Leech Physician/Extender: [name] in [name]: 65 Verbal / Phone Orders: Yes Clinician: Afful, RN, BSN, Rita Read Back and Verified: Yes Diagnosis Coding Wound Cleansing Wound #1 Right,Lateral Toe Great o Cleanse wound with mild soap and water o May Shower, gently pat wound dry prior to applying new dressing. Anesthetic Wound #1 Right,Lateral Toe Great o Topical Lidocaine 4% cream applied to wound bed prior to debridement Primary Wound Dressing Wound #1  Right,Lateral Toe Great o Aquacel Ag Secondary Dressing Wound #1 Right,Lateral Toe Great o Gauze and Kerlix/Conform Dressing Change Frequency Wound #1 Right,Lateral Toe Great o Change dressing every other day. Follow-up Appointments Wound #1 Right,Lateral Toe Great o Return Appointment in 1 week. Off-Loading Wound #1 Right,Lateral Toe Great o Open toe surgical shoe with peg assist. Electronic Signature(s) Signed: 09/28/2015 10:15:10 AM By: Elpidio Eric BSN, RN Signed: 09/28/2015 3:13:05 PM By: [name] MD, FACS [place], Riane ([id number]) Entered By: Elpidio Eric on 09/28/2015 10:15:10 [name] [name], [name] [name] ([id number]) -------------------------------------------------------------------------------- Problem List Details Patient Name: [name] [name], [name] [name] 09/28/2015 10:00 Date of Service: AM Medical Record [id number] Number: Patient Account Number: [id number] [birth year] (74 y.o. 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 Treating RN: Date of Birth/Sex: Female) Other Clinician: Primary Care Physician: Idelle Leech Treating Rudolph Dobler,  Jamye Balicki Referring Physician: Idelle Leech Physician/Extender: [name] [name]: 14 Subjective Chief Complaint Information obtained from Patient Patient presents to the wound care center for a consult due non healing wound. the patient came last week to the wound care center with a ulcerated area on the medial part of her right foot which she's had for about 6 [name] now. History of Present Illness (HPI) The following HPI elements were documented for the patient's wound: Location: wound on the medial part of her right forefoot near the first metatarsal Quality: Patient reports experiencing a dull pain to affected area(s). Severity: Patient states wound are getting worse. Duration: Patient has had the wound for < 6 [name] prior to presenting for [name] Timing: Pain in wound is Intermittent (comes and goes Context: The wound appeared gradually over time Modifying Factors: Consults to this date include: she was seen by her PCP but there is no evidence of any definite workup with x-rays. I understand she was advised IV ciprofloxacin and clindamycin but the patient refused Associated Signs and Symptoms: Patient reports having difficulty standing for long periods. 75 year old patient who is alert and oriented and comes to see [place] with a history of having a wound on the medial part of her right forefoot which she's had for about 6 [name]. she has a past medical history of diabetes mellitus with peripheral angiopathy without gangrene, allergic rhinitis, hypertensive heart disease, glaucoma, chronic venous insufficiency, diverticulitis and asthma. the patient has never smoked cigarettes. I understand an x-ray may be done of the right foot and we will await this report prior to ordering further tests. 07/12/2015-- x-ray of the right foot done on 07/11/2015 which was compared to the one done on 06/20/2015 shows no acute fracture or dislocation on the x-ray. 07/27/2015 -- the patient has not been  here to see [place] for 2 [name] and in the meanwhile she has not yet seen a podiatrist. 08/06/2015 -- the patient did see a podiatrist and we have not gotten any official notes but she says that he trimmed her nails and gave her a offloading shoe which she was asked to wear. He has not recommended any surgical debridement. [name] [name], [name] [name] ([id number]) 09/28/2015 - the patient comes here rather seldom because of transportation issues. The wound care has also been done intermittently by a home health nurses and by [facility]. Objective Constitutional Pulse regular. Respirations normal and unlabored. Afebrile. Vitals Time Taken: 9:48 AM, Height: 66 in, Weight: 195 lbs, BMI: 31.5, Temperature: 97.9 F, Pulse: 68 bpm, Respiratory Rate: 17 breaths/min, Blood Pressure: 132/60 mmHg. Eyes Nonicteric. Reactive to light. Ears, Nose, Mouth, and Throat Lips, teeth, and gums WNL.Marland Kitchen Moist mucosa without lesions. Neck supple and nontender. No palpable supraclavicular or cervical adenopathy. Normal sized without goiter. Respiratory WNL. No retractions.. Cardiovascular Pedal Pulses WNL. No clubbing, cyanosis or  edema. Lymphatic No adneopathy. No adenopathy. No adenopathy. Musculoskeletal Adexa without tenderness or enlargement.. Digits and nails w/o clubbing, cyanosis, infection, petechiae, ischemia, or inflammatory conditions.Marland Kitchen Psychiatric Judgement and insight Intact.. No evidence of depression, anxiety, or agitation.. General Notes: she has developed a lot of eschar and there is callus formed over the ulceration and I have sharply debrided skin and subcutaneous tissue and surrounding fibrinous debris down to healthy granulation tissue. Forcep and scissors were used and hemostasis was controlled with pressure Integumentary (Hair, Skin) No suspicious lesions. No crepitus or fluctuance. No peri-wound warmth or erythema. No masses.Marland Kitchen [name] [name], [name] [name] ([id number]) Wound #1 status is Open. Original cause of wound was  Gradually Appeared. The wound is located on the [employer]. The wound measures 1.5cm length x 1.3cm width x 0.2cm depth; 1.532cm^2 area and 0.306cm^3 volume. There is fascia exposed. There is no tunneling or undermining noted. There is a none present amount of drainage noted. The wound margin is indistinct and nonvisible. There is large (67- 100%) red granulation within the wound bed. There is a small (1-33%) amount of necrotic tissue within the wound bed including Eschar. The periwound skin appearance exhibited: Callus, Dry/Scaly. The periwound skin appearance did not exhibit: Crepitus, Excoriation, Fluctuance, Friable, Induration, Localized Edema, Rash, Scarring, Maceration, Moist, Atrophie Blanche, Cyanosis, Ecchymosis, Hemosiderin Staining, Mottled, Pallor, Rubor, Erythema. Periwound temperature was noted as No Abnormality. 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 We again discussed control of her diabetes nutrition and vitamin supplements. Procedures Wound #1 Wound #1 is a Diabetic Wound/Ulcer of the Lower Extremity located on the Right,Lateral Toe Great . There was a Skin/Subcutaneous Tissue Debridement (40981-19147) debridement with total area of 1.95 sq cm performed by [name], MD. with the following instrument(s): Forceps and Scissors to remove Non-Viable tissue/material including Exudate, Fibrin/Slough, Callus, and Subcutaneous after achieving pain control using Lidocaine 4% Topical Solution. A time out was conducted prior to the start of the procedure. A Minimum amount of bleeding was controlled with N/A. The procedure was tolerated well with a pain level of 3 throughout and a pain level of 3 following the procedure. Post Debridement  Measurements: 1cm length x 1cm width x 0.2cm depth; 0.157cm^3 volume. Post procedure Diagnosis Wound #1: Same as Pre-Procedure Plan [name] [name], [name] [name] ([id number]) Wound Cleansing: Wound #1 Right,Lateral Toe Great: Cleanse wound with mild soap and water May Shower, gently pat wound dry prior to applying new dressing. Anesthetic: Wound #1 Right,Lateral Toe Great: Topical Lidocaine 4% cream applied to wound bed prior to debridement Primary Wound Dressing: Wound #1 Right,Lateral Toe Great: Aquacel Ag Secondary Dressing: Wound #1 Right,Lateral Toe Great: Gauze and Kerlix/Conform Dressing Change Frequency: Wound #1 Right,Lateral Toe Great: Change dressing every other day. Follow-up Appointments: Wound #1 Right,Lateral Toe Great: Return Appointment in 1 week. Off-Loading: Wound #1 Right,Lateral Toe Great: Open toe surgical shoe with peg assist. I will stop using Santyl and start using silver alginate with an appropriate offloading foam. We again discussed control of her diabetes nutrition and vitamin supplements. Electronic Signature(s) Signed: 09/28/2015 3:18:53 PM By: [name] MD, FACS Previous Signature: 09/28/2015 10:09:16 AM Version By: [name] MD, FACS Entered By: [name] on 09/28/2015 15:18:53 [place], [place] ([id number]) -------------------------------------------------------------------------------- SuperBill Details Patient Name: [name] Date of Service: 09/28/2015 Medical Record Number: [id number] Patient Account  Number: [id number] Date of Birth/Sex: [birth year] (75 y.o. Female) Treating RN: Primary Care Physician: Idelle Leech Other Clinician: Referring Physician: Idelle Leech Treating Physician/Extender: Rudene Re in [name]: 14 Diagnosis Coding ICD-10 Codes Code Description E11.621 Type 2 diabetes mellitus with foot ulcer L97.513 Non-pressure chronic ulcer of other part of right foot with necrosis of muscle Facility  Procedures CPT4: Description Modifier Quantity Code 82956213 11042 - DEB SUBQ TISSUE 20 SQ CM/< 1 ICD-10 Description Diagnosis E11.621 Type 2 diabetes mellitus with foot ulcer L97.513 Non-pressure chronic ulcer of other part of right foot with necrosis of muscle Physician Procedures CPT4: Description Modifier Quantity Code 0865784 11042 - WC PHYS SUBQ TISS 20 SQ CM 1 ICD-10 Description Diagnosis E11.621 Type 2 diabetes mellitus with foot ulcer L97.513 Non-pressure chronic ulcer of other part of right foot with necrosis of muscle Electronic Signature(s) Signed: 09/28/2015 10:09:24 AM By: [name] MD, FACS Entered By: [name] on 09/28/2015 69:62:95

## 2015-10-11 ENCOUNTER — Other Ambulatory Visit: Payer: Self-pay | Admitting: Geriatric Medicine

## 2015-10-11 DIAGNOSIS — L97513 Non-pressure chronic ulcer of other part of right foot with necrosis of muscle: Secondary | ICD-10-CM

## 2015-10-26 ENCOUNTER — Ambulatory Visit: Payer: Medicare (Managed Care) | Admitting: Surgery

## 2015-11-09 ENCOUNTER — Ambulatory Visit: Payer: Medicare (Managed Care)

## 2017-03-13 ENCOUNTER — Inpatient Hospital Stay: Payer: No Typology Code available for payment source

## 2017-03-13 ENCOUNTER — Emergency Department: Payer: No Typology Code available for payment source

## 2017-03-13 ENCOUNTER — Inpatient Hospital Stay
Admission: EM | Admit: 2017-03-13 | Discharge: 2017-03-15 | DRG: 638 | Disposition: A | Discharge status: Skilled Nursing Facility | Payer: No Typology Code available for payment source | Attending: Internal Medicine | Admitting: Internal Medicine

## 2017-03-13 DIAGNOSIS — Z7984 Long term (current) use of oral hypoglycemic drugs: Secondary | ICD-10-CM

## 2017-03-13 DIAGNOSIS — T148XXA Other injury of unspecified body region, initial encounter: Secondary | ICD-10-CM

## 2017-03-13 DIAGNOSIS — W19XXXA Unspecified fall, initial encounter: Secondary | ICD-10-CM

## 2017-03-13 DIAGNOSIS — Z66 Do not resuscitate: Secondary | ICD-10-CM | POA: Diagnosis present

## 2017-03-13 DIAGNOSIS — E039 Hypothyroidism, unspecified: Secondary | ICD-10-CM | POA: Diagnosis present

## 2017-03-13 DIAGNOSIS — A419 Sepsis, unspecified organism: Secondary | ICD-10-CM

## 2017-03-13 DIAGNOSIS — I110 Hypertensive heart disease with heart failure: Secondary | ICD-10-CM | POA: Diagnosis present

## 2017-03-13 DIAGNOSIS — E11621 Type 2 diabetes mellitus with foot ulcer: Secondary | ICD-10-CM | POA: Diagnosis not present

## 2017-03-13 DIAGNOSIS — E1151 Type 2 diabetes mellitus with diabetic peripheral angiopathy without gangrene: Secondary | ICD-10-CM | POA: Diagnosis present

## 2017-03-13 DIAGNOSIS — J45909 Unspecified asthma, uncomplicated: Secondary | ICD-10-CM | POA: Diagnosis present

## 2017-03-13 DIAGNOSIS — Z79899 Other long term (current) drug therapy: Secondary | ICD-10-CM

## 2017-03-13 DIAGNOSIS — E1169 Type 2 diabetes mellitus with other specified complication: Secondary | ICD-10-CM | POA: Diagnosis not present

## 2017-03-13 DIAGNOSIS — Z7189 Other specified counseling: Secondary | ICD-10-CM

## 2017-03-13 DIAGNOSIS — H401130 Primary open-angle glaucoma, bilateral, stage unspecified: Secondary | ICD-10-CM | POA: Diagnosis present

## 2017-03-13 DIAGNOSIS — M868X7 Other osteomyelitis, ankle and foot: Secondary | ICD-10-CM | POA: Diagnosis present

## 2017-03-13 DIAGNOSIS — I248 Other forms of acute ischemic heart disease: Secondary | ICD-10-CM | POA: Diagnosis present

## 2017-03-13 DIAGNOSIS — L089 Local infection of the skin and subcutaneous tissue, unspecified: Secondary | ICD-10-CM

## 2017-03-13 DIAGNOSIS — L97511 Non-pressure chronic ulcer of other part of right foot limited to breakdown of skin: Secondary | ICD-10-CM

## 2017-03-13 DIAGNOSIS — R296 Repeated falls: Secondary | ICD-10-CM | POA: Diagnosis present

## 2017-03-13 DIAGNOSIS — Z515 Encounter for palliative care: Secondary | ICD-10-CM | POA: Diagnosis not present

## 2017-03-13 DIAGNOSIS — Z833 Family history of diabetes mellitus: Secondary | ICD-10-CM

## 2017-03-13 DIAGNOSIS — I509 Heart failure, unspecified: Secondary | ICD-10-CM | POA: Diagnosis present

## 2017-03-13 DIAGNOSIS — M869 Osteomyelitis, unspecified: Secondary | ICD-10-CM | POA: Diagnosis present

## 2017-03-13 DIAGNOSIS — L97514 Non-pressure chronic ulcer of other part of right foot with necrosis of bone: Secondary | ICD-10-CM

## 2017-03-13 LAB — CBC WITH DIFFERENTIAL/PLATELET
BASOS ABS: 0 10*3/uL (ref 0–0.1)
BASOS PCT: 0 %
Eosinophils Absolute: 0 10*3/uL (ref 0–0.7)
Eosinophils Relative: 0 %
HEMATOCRIT: 35.7 % (ref 35.0–47.0)
HEMOGLOBIN: 11.7 g/dL — AB (ref 12.0–16.0)
Lymphocytes Relative: 4 %
Lymphs Abs: 0.5 10*3/uL — ABNORMAL LOW (ref 1.0–3.6)
MCH: 26.5 pg (ref 26.0–34.0)
MCHC: 32.8 g/dL (ref 32.0–36.0)
MCV: 80.6 fL (ref 80.0–100.0)
MONOS PCT: 8 %
Monocytes Absolute: 1.1 10*3/uL — ABNORMAL HIGH (ref 0.2–0.9)
NEUTROS ABS: 12.8 10*3/uL — AB (ref 1.4–6.5)
NEUTROS PCT: 88 %
Platelets: 281 10*3/uL (ref 150–440)
RBC: 4.43 MIL/uL (ref 3.80–5.20)
RDW: 14.8 % — ABNORMAL HIGH (ref 11.5–14.5)
WBC: 14.4 10*3/uL — ABNORMAL HIGH (ref 3.6–11.0)

## 2017-03-13 LAB — URINALYSIS, COMPLETE (UACMP) WITH MICROSCOPIC
Bacteria, UA: NONE SEEN
Bilirubin Urine: NEGATIVE
GLUCOSE, UA: 150 mg/dL — AB
Ketones, ur: 20 mg/dL — AB
Leukocytes, UA: NEGATIVE
Nitrite: NEGATIVE
PROTEIN: 30 mg/dL — AB
Specific Gravity, Urine: 1.021 (ref 1.005–1.030)
pH: 5 (ref 5.0–8.0)

## 2017-03-13 LAB — COMPREHENSIVE METABOLIC PANEL
ALK PHOS: 56 U/L (ref 38–126)
ALT: 15 U/L (ref 14–54)
ANION GAP: 10 (ref 5–15)
AST: 27 U/L (ref 15–41)
Albumin: 4 g/dL (ref 3.5–5.0)
BUN: 18 mg/dL (ref 6–20)
CALCIUM: 9.4 mg/dL (ref 8.9–10.3)
CO2: 22 mmol/L (ref 22–32)
Chloride: 101 mmol/L (ref 101–111)
Creatinine, Ser: 0.85 mg/dL (ref 0.44–1.00)
GFR calc non Af Amer: >60 mL/min (ref >60)
Glucose, Bld: 220 mg/dL — ABNORMAL HIGH (ref 65–99)
Potassium: 4.5 mmol/L (ref 3.5–5.1)
SODIUM: 133 mmol/L — AB (ref 135–145)
TOTAL PROTEIN: 7.5 g/dL (ref 6.5–8.1)
Total Bilirubin: 1.5 mg/dL — ABNORMAL HIGH (ref 0.3–1.2)

## 2017-03-13 LAB — TROPONIN I: TROPONIN I: 0.09 ng/mL — AB (ref <0.03)

## 2017-03-13 LAB — GLUCOSE, CAPILLARY
Glucose-Capillary: 109 mg/dL — ABNORMAL HIGH (ref 65–99)
Glucose-Capillary: 104 mg/dL — ABNORMAL HIGH (ref 65–99)
Glucose-Capillary: 134 mg/dL — ABNORMAL HIGH (ref 65–99)

## 2017-03-13 LAB — TSH: TSH: 3.218 u[IU]/mL (ref 0.350–4.500)

## 2017-03-13 LAB — LACTIC ACID, PLASMA
LACTIC ACID, VENOUS: 1.7 mmol/L (ref 0.5–1.9)
Lactic Acid, Venous: 2.8 mmol/L (ref 0.5–1.9)

## 2017-03-13 MED ORDER — INSULIN ASPART 100 UNIT/ML ~~LOC~~ SOLN
0.0000 [IU] | Freq: Three times a day (TID) | SUBCUTANEOUS | Status: DC
  Administered 2017-03-14 – 2017-03-15 (×3): 1 [IU] via SUBCUTANEOUS
  Administered 2017-03-15: 2 [IU] via SUBCUTANEOUS
  Filled 2017-03-13 (×4): qty 1

## 2017-03-13 MED ORDER — PIPERACILLIN-TAZOBACTAM 3.375 G IVPB 30 MIN
3.3750 g | Freq: Once | INTRAVENOUS | Status: AC
  Administered 2017-03-13: 3.375 g via INTRAVENOUS

## 2017-03-13 MED ORDER — DOCUSATE SODIUM 100 MG PO CAPS
100.0000 mg | ORAL_CAPSULE | Freq: Two times a day (BID) | ORAL | Status: DC
  Administered 2017-03-13 – 2017-03-15 (×5): 100 mg via ORAL
  Filled 2017-03-13 (×6): qty 1

## 2017-03-13 MED ORDER — PIPERACILLIN-TAZOBACTAM 3.375 G IVPB
INTRAVENOUS | Status: AC
  Filled 2017-03-13: qty 50

## 2017-03-13 MED ORDER — SODIUM CHLORIDE 0.9 % IV BOLUS (SEPSIS): 1000.0000 mL | Freq: Once | INTRAVENOUS | Status: DC

## 2017-03-13 MED ORDER — ACETAMINOPHEN 325 MG PO TABS
650.0000 mg | ORAL_TABLET | Freq: Four times a day (QID) | ORAL | Status: DC | PRN
  Administered 2017-03-14 – 2017-03-15 (×2): 650 mg via ORAL
  Filled 2017-03-13 (×2): qty 2

## 2017-03-13 MED ORDER — PIPERACILLIN-TAZOBACTAM 3.375 G IVPB
3.3750 g | Freq: Three times a day (TID) | INTRAVENOUS | Status: DC
Start: 2017-03-13 — End: 2017-03-15
  Administered 2017-03-13 – 2017-03-15 (×7): 3.375 g via INTRAVENOUS
  Filled 2017-03-13 (×8): qty 50

## 2017-03-13 MED ORDER — VITAMIN D (ERGOCALCIFEROL) 1.25 MG (50000 UNIT) PO CAPS
50000.0000 [IU] | ORAL_CAPSULE | ORAL | Status: DC
  Administered 2017-03-13: 50000 [IU] via ORAL
  Filled 2017-03-13: qty 1

## 2017-03-13 MED ORDER — ONDANSETRON HCL 4 MG PO TABS: 4.0000 mg | ORAL_TABLET | Freq: Four times a day (QID) | ORAL | Status: DC | PRN

## 2017-03-13 MED ORDER — LOPERAMIDE HCL 2 MG PO CAPS: 2.0000 mg | ORAL_CAPSULE | Freq: Four times a day (QID) | ORAL | Status: DC | PRN

## 2017-03-13 MED ORDER — ACETAMINOPHEN 650 MG RE SUPP: 650.0000 mg | Freq: Four times a day (QID) | RECTAL | Status: DC | PRN

## 2017-03-13 MED ORDER — SODIUM CHLORIDE 0.9 % IV BOLUS (SEPSIS)
500.0000 mL | Freq: Once | INTRAVENOUS | Status: AC
  Administered 2017-03-13: 500 mL via INTRAVENOUS

## 2017-03-13 MED ORDER — VANCOMYCIN HCL IN DEXTROSE 1-5 GM/200ML-% IV SOLN
1000.0000 mg | Freq: Once | INTRAVENOUS | Status: AC
  Administered 2017-03-13: 1000 mg via INTRAVENOUS
  Filled 2017-03-13: qty 200

## 2017-03-13 MED ORDER — BRIMONIDINE TARTRATE 0.15 % OP SOLN
1.0000 [drp] | Freq: Three times a day (TID) | OPHTHALMIC | Status: DC
  Administered 2017-03-13 – 2017-03-15 (×6): 1 [drp] via OPHTHALMIC
  Filled 2017-03-13 (×2): qty 5

## 2017-03-13 MED ORDER — TIMOLOL MALEATE 0.5 % OP SOLN
1.0000 [drp] | Freq: Two times a day (BID) | OPHTHALMIC | Status: DC
  Administered 2017-03-13 – 2017-03-15 (×5): 1 [drp] via OPHTHALMIC
  Filled 2017-03-13 (×2): qty 5

## 2017-03-13 MED ORDER — ASPIRIN EC 81 MG PO TBEC
81.0000 mg | DELAYED_RELEASE_TABLET | Freq: Every day | ORAL | Status: DC
  Administered 2017-03-13 – 2017-03-15 (×3): 81 mg via ORAL
  Filled 2017-03-13 (×3): qty 1

## 2017-03-13 MED ORDER — LEVOTHYROXINE SODIUM 75 MCG PO TABS
75.0000 ug | ORAL_TABLET | Freq: Every day | ORAL | Status: DC
  Administered 2017-03-13 – 2017-03-15 (×3): 75 ug via ORAL
  Filled 2017-03-13: qty 3
  Filled 2017-03-13: qty 1
  Filled 2017-03-13: qty 3
  Filled 2017-03-13 (×2): qty 1

## 2017-03-13 MED ORDER — LATANOPROST 0.005 % OP SOLN
1.0000 [drp] | Freq: Every day | OPHTHALMIC | Status: DC
  Administered 2017-03-13 – 2017-03-14 (×2): 1 [drp] via OPHTHALMIC
  Filled 2017-03-13: qty 2.5

## 2017-03-13 MED ORDER — ONDANSETRON HCL 4 MG/2ML IJ SOLN: 4.0000 mg | Freq: Four times a day (QID) | INTRAMUSCULAR | Status: DC | PRN

## 2017-03-13 MED ORDER — VANCOMYCIN HCL 10 G IV SOLR
1250.0000 mg | INTRAVENOUS | Status: DC
  Administered 2017-03-13 – 2017-03-14 (×3): 1250 mg via INTRAVENOUS
  Filled 2017-03-13 (×4): qty 1250

## 2017-03-13 MED ORDER — ENALAPRIL MALEATE 10 MG PO TABS
20.0000 mg | ORAL_TABLET | Freq: Every day | ORAL | Status: DC
  Administered 2017-03-13 – 2017-03-15 (×3): 20 mg via ORAL
  Filled 2017-03-13 (×2): qty 2
  Filled 2017-03-13: qty 1

## 2017-03-13 NOTE — Progress Notes (Signed)
Clinical Child psychotherapistocial Worker (CSW) received [facility] [place] [facility]. Per Charlynn CourtSharita patient is in the [facility] program and lives alone in a senior apartment complex [address] [place]. Per Charlynn CourtSharita [facility] will help coordinator discharge recommendations.  Baker Hughes IncorporatedBailey Brynlyn Dade, LCSW [phone]

## 2017-03-13 NOTE — Progress Notes (Signed)
Inpatient Diabetes Program Recommendations  AACE/ADA: New Consensus Statement on Inpatient Glycemic Control (2015)  Target Ranges:  Prepandial:   less than 140 mg/dL      Peak postprandial:   less than 180 mg/dL (1-2 hours)      Critically ill patients:  140 - 180 mg/dL   Results for [name] [name], [name] [name] (MRN [id number]) as of 03/13/2017 11:30  Ref. Range 03/13/2017 02:31  Glucose Latest Ref Range: 65 - 99 mg/dL 409220 (H)   Review of Glycemic Control  Diabetes history: DM2 Outpatient Diabetes medications: Metformin 850 mg BID Current orders for Inpatient glycemic control: None  Inpatient Diabetes Program Recommendations: Correction (SSI): Please consider ordering CBGs with Novolog 0-9 units Q4H. HgbA1C: A1C in process.  Thanks, [name] PennerMarie Kassidy Frankson, RN, MSN, CDE Diabetes Coordinator Inpatient Diabetes Program [phone] (Team Pager from 8am to 5pm)

## 2017-03-13 NOTE — ED Notes (Signed)
Bed alarm placed on patient. Fall risk bracelet placed on patient. Fall mats placed on floor surrounding bed. Pt given call bell and asked not to try to stand, but to hit call bell for assistance. PT verbalized understanding.

## 2017-03-13 NOTE — ED Triage Notes (Signed)
Per EMS patient has fallen 3 times in the last 6 hours; 1 fall at 2030, 1 fall at 2130, and one fall at 0030 today. Pt c/o right elbow and knee pain. Pt reports infection of right foot. Pt c/o chronic right foot pain. Pt on 3rd day of doxycycline for foot infection. Patient hit head on couch cushion on 3rd fall. Pt denies other injury and LOC

## 2017-03-13 NOTE — Progress Notes (Signed)
Pharmacy Antibiotic Note  [name] SpittleMelissa [name] is a 76 y.o. female admitted on 03/13/2017 with cellulitis.  Pharmacy has been consulted for vancomycin and Zosyn dosing.  Plan: DW 69kg  Vd 48L kei 0.054 hr-1  T1/2 13 hours Vancomycin 1250 mg q 18 hours ordered with stacked dosing. Level before 5th dose. Goal trough 15-20.  Zosyn 3.375 grams q 8 hours ordered.  Height: 5\' 5"  (165.1 cm) Weight: 191 lb (86.6 kg) IBW/kg (Calculated) : 57  Temp (24hrs), Avg:98.2 F (36.8 C), Min:98.2 F (36.8 C), Max:98.2 F (36.8 C)   Recent Labs Lab 03/13/17 0208 03/13/17 0231  WBC  --  14.4*  CREATININE  --  0.85  LATICACIDVEN 2.8*  --     Estimated Creatinine Clearance: 60.2 mL/min (by C-G formula based on SCr of 0.85 mg/dL).    No Known Allergies  Antimicrobials this admission: Vancomycin Zosyn 6/29 >>    >>   Dose adjustments this admission:   Microbiology results: 6/29 BCx: pending 6/29 WoundCx: pending    Thank you for allowing pharmacy to be a part of this patient'[name] care.  [name] [name] 03/13/2017 5:14 AM

## 2017-03-13 NOTE — Consult Note (Addendum)
ORTHOPAEDIC CONSULTATION  REQUESTING PHYSICIAN: Adrian Saran, MD  Chief Complaint: right foot infection  HPI: [name] [name] is a 76 y.o. female who complains of  Right foot diabetic foot infection.  Admitted with recent falls and found to have ulcer to right foot in ER with purulent drainage.  Is currently nothing by mouth for possible debridement if needed. Patient is very lethargic and provide some history at this time. Does complain of pain to the right great toe joint with pressure. Cannot reliably say how long the wound is been on her right foot. She apparently is at home but does have [facility] following. Palate medicine as recently seen. Patient is DO NOT RESUSCITATE but is willing to accept antibiotics. She does have comfort care measures in place.  Past Medical History:  Diagnosis Date  . Allergic rhinitis   . Asthma   . 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 Diverticulitis   . Hypertensive heart disease with heart failure (HCC)   . Pain in right shoulder   . Primary open-angle glaucoma, bilateral, stage unspecified   . Thyroid disease    hypothroidism, unspecified  . Venous insufficiency (chronic) (peripheral)   . Vitamin D deficiency    Past Surgical History:  Procedure Laterality Date  . none     Social History   Social History  . Marital status: Single    Spouse name: N/A  . Number of children: N/A  . Years of education: N/A   Social History Main Topics  . Smoking status: Never Smoker  . Smokeless tobacco: Never Used  . Alcohol use No  . Drug use: No  . Sexual activity: Not Asked   Other Topics Concern  . None   Social History Narrative  . None   Family History  Problem Relation Age of Onset  . Diabetes Mellitus II Mother    No Known Allergies Prior to Admission medications   Medication Sig Start Date End Date Taking? Authorizing Provider  aspirin EC 81 MG tablet Take 81 mg by mouth  daily.   Yes [provider]  acetaminophen (TYLENOL) 325 MG tablet Take 650 mg by mouth every 6 (six) hours as needed for mild pain.    [provider]  bimatoprost (LUMIGAN) 0.03 % ophthalmic solution Place 1 drop into the left eye at bedtime.    [provider]  brimonidine (ALPHAGAN) 0.15 % ophthalmic solution Place 1 drop into the left eye 3 (three) times daily.    [provider]  ciprofloxacin (CIPRO IN D5W) 400 MG/200ML SOLN Inject 400 mg into the vein every 12 (twelve) hours.    [provider]  ciprofloxacin (CIPRO) 400 MG/40ML SOLN injection Inject 400 mg into the vein every 12 (twelve) hours.    [provider]  ciprofloxacin (CIPRO) 500 MG tablet Take 500 mg by mouth 2 (two) times daily.    [provider]  clindamycin (CLEOCIN IN D5W) 600 MG/50ML IVPB Inject 600 mg into the vein.    [provider]  clindamycin (CLEOCIN) 150 MG capsule Take 600 mg by mouth every 6 (six) hours.    [provider]  Clindamycin Phosphate (CLEOCIN PHOSPHATE) 600 MG/4ML SOLN Inject 600 mg into the vein every 6 (six) hours.    [provider]  enalapril (VASOTEC) 20 MG tablet Take 20 mg by mouth daily.    [provider]  ergocalciferol (DRISDOL) 50000 UNITS capsule Take 50,000 Units  by mouth once a week.    [provider]  levothyroxine (SYNTHROID, LEVOTHROID) 75 MCG tablet Take 75 mcg by mouth daily before breakfast.    [provider]  loperamide (IMODIUM A-D) 2 MG tablet Take 2 mg by mouth 4 (four) times daily as needed for diarrhea or loose stools.    [provider]  metFORMIN (GLUCOPHAGE) 850 MG tablet Take 850 mg by mouth 2 (two) times daily with a meal.    [provider]  timolol (BETIMOL) 0.5 % ophthalmic solution Place 1 drop into both eyes 2 (two) times daily.    [provider]   Dg Elbow Complete Right  Result Date: 03/13/2017 CLINICAL DATA:  Fall  with elbow pain EXAM: RIGHT ELBOW - COMPLETE 3+ VIEW COMPARISON:  None. FINDINGS: Nonstandard lateral view limits assessment for effusion. No definite acute displaced fracture or dislocation is evident. IMPRESSION: Negative. Electronically Signed   By: Jasmine Pang M.D.   On: 03/13/2017 02:10   Dg Knee Complete 4 Views Right  Result Date: 03/13/2017 CLINICAL DATA:  Fall with knee pain EXAM: RIGHT KNEE - COMPLETE 4+ VIEW COMPARISON:  None. FINDINGS: No fracture or dislocation. Moderate patellofemoral degenerative change with bony spurring. Marked degenerative changes of the medial compartment and mild to moderate degenerative changes of the lateral compartment. Vascular calcifications. Generalized soft tissue swelling. IMPRESSION: Moderate to marked degenerative changes. No acute osseous abnormality. Electronically Signed   By: Jasmine Pang M.D.   On: 03/13/2017 02:12    Positive ROS: All other systems have been reviewed and were otherwise negative with the exception of those mentioned in the HPI and as above.  12 point ROS was performed.  Physical Exam: General: Alert and oriented.  No apparent distress.  Vascular:  Left foot:Dorsalis Pedis:  diminished Posterior Tibial:  absent  Right foot: Dorsalis Pedis:  present Posterior Tibial:  diminished  Neuro:intact gross sensation to the right foot. She does complain of some pain with palpation to the right great toe joint at the infection site.  Derm: There was a noted blister ulceration overlying the right first MTPJ. Upon removal of the superficial skin there was noted purulent drainage from the ulcer. Excisional debridement was performed with a 15 blade down to bone at this time. Bone was exposed with some purulence to the surrounding bone and soft tissue.  Ortho/MS: Noted hallux valgus deformity on the right foot with pressure induced lesion on the right first MTPJ.   Assessment: Full-thickness diabetic foot ulceration with abscess and  infection right great toe joint  Plan: Patient does have a full-thickness ulceration to the right first MTPJ that does probe down to bone with purulence surrounding the area. At bedside excisional debridement was performed with a 15 blade down to bone and removal of all of the purulent infected tissue was performed without anesthesia at this time. A deep wound culture was performed. The ulceration was approximately 1.5 cm in diameter upon debridement. We'll order x-rays to evaluate this right great toe joint region. She may need to undergo debridement though positive medicine has evaluated him patient does have DO NOT RESUSCITATE in place. We discussed conservative treatment with local wound care and antibiotics to the area. Patient will give consideration to surgical intervention with removal of the infected bone. She will discuss with his family members. I'm going await the x-ray to make final decision on my recommendations. For now we will continue with local wound care to the right foot and broad-spectrum IV  antibiotics until cultures are finalized.  Addendum:  Spoke to palliative medicine and DNR is in place.  If pt consents to surgery ok to proceed as pt able to give consent.  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 I discussed these findings with the patient in detail. I outlined possibility of surgical excision of the infected bone and removal of the infection. I discussed the risks and benefits associated with surgery. We also discussed conservative treatments of local wound care. She has elected to undergo local wound care and antibiotics at this time. Will write for dressing changes to be performed. Patient can follow up with podiatry in the outpatient clinic as needed. Follow-up with wound care can be performed as well.    Irean HongFowler, Amiria Orrison A, DPM Cell [phone]   03/13/2017 3:59 PM

## 2017-03-13 NOTE — ED Provider Notes (Signed)
[facility] Emergency Department Provider Note   ____________________________________________   First MD Initiated Contact with Patient 03/13/17 [id number]     (approximate)  I have reviewed the triage vital signs and the nursing notes.   HISTORY  Chief Complaint Fall; Knee Pain; and Elbow Pain    HPI [name] [name] is a 76 y.o. female brought to the ED from home via EMS with a chief complaint of multiple falls and right toe infection. Patient has a history of diabetes, followed by [facility] who is on her third day of doxycycline for right diabetic toe ulcer.States she fell 3 times tonight within the past 6 hours prior to arrival. Denies syncope but states she does not know why she fell. Denies fever, chest pain, shortness of breath, abdominal pain, nausea, vomiting. Nothing makes her symptoms better. Ambulation makes her symptoms worse.   Past Medical History:  Diagnosis Date  . Allergic rhinitis   . Asthma   . 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 Diverticulitis   . Hypertensive heart disease with heart failure (HCC)   . Pain in right shoulder   . Primary open-angle glaucoma, bilateral, stage unspecified   . Thyroid disease    hypothroidism, unspecified  . Venous insufficiency (chronic) (peripheral)   . Vitamin D deficiency     Patient Active Problem List   Diagnosis Date Noted  . Diabetic foot ulcer with osteomyelitis (HCC) 07/06/2015    History reviewed. No pertinent surgical history.  Prior to Admission medications   Medication Sig Start Date End Date Taking? Authorizing Provider  acetaminophen (TYLENOL) 325 MG tablet Take 650 mg by mouth every 6 (six) hours as needed for mild pain.    [provider]  aspirin EC 81 MG tablet Take 81 mg by mouth daily.    [provider]  bimatoprost (LUMIGAN) 0.03 % ophthalmic solution Place 1 drop into the left eye at bedtime.     [provider]  brimonidine (ALPHAGAN) 0.15 % ophthalmic solution Place 1 drop into the left eye 3 (three) times daily.    [provider]  ciprofloxacin (CIPRO IN D5W) 400 MG/200ML SOLN Inject 400 mg into the vein every 12 (twelve) hours.    [provider]  ciprofloxacin (CIPRO) 400 MG/40ML SOLN injection Inject 400 mg into the vein every 12 (twelve) hours.    [provider]  ciprofloxacin (CIPRO) 500 MG tablet Take 500 mg by mouth 2 (two) times daily.    [provider]  clindamycin (CLEOCIN IN D5W) 600 MG/50ML IVPB Inject 600 mg into the vein.    [provider]  clindamycin (CLEOCIN) 150 MG capsule Take 600 mg by mouth every 6 (six) hours.    [provider]  Clindamycin Phosphate (CLEOCIN PHOSPHATE) 600 MG/4ML SOLN Inject 600 mg into the vein every 6 (six) hours.    [provider]  enalapril (VASOTEC) 20 MG tablet Take 20 mg by mouth daily.    [provider]  ergocalciferol (DRISDOL) 50000 UNITS capsule Take 50,000 Units by mouth once a week.    [provider]  levothyroxine (SYNTHROID, LEVOTHROID) 75 MCG tablet Take 75 mcg by mouth daily before breakfast.    [provider]  loperamide (IMODIUM A-D) 2 MG tablet Take 2 mg by mouth 4 (four) times daily as needed for diarrhea or loose stools.    [provider]  metFORMIN (GLUCOPHAGE) 850 MG tablet Take  850 mg by mouth 2 (two) times daily with a meal.    [provider]  timolol (BETIMOL) 0.5 % ophthalmic solution Place 1 drop into both eyes 2 (two) times daily.    [provider]    Allergies Patient has no known allergies.  No family history on file.  Social History Social History  Substance Use Topics  . Smoking status: Never Smoker  . Smokeless tobacco: Never Used  . Alcohol use No    Review of Systems  Constitutional: Positive for multiple falls. No fever/chills. Eyes: No visual changes. ENT: No  sore throat. Cardiovascular: Denies chest pain. Respiratory: Denies shortness of breath. Gastrointestinal: No abdominal pain.  No nausea, no vomiting.  No diarrhea.  No constipation. Genitourinary: Negative for dysuria. Musculoskeletal: Positive for right toe pain secondary to ulcer. Negative for back pain. Skin: Negative for rash. Neurological: Negative for headaches, focal weakness or numbness.   ____________________________________________   PHYSICAL EXAM:  VITAL SIGNS: ED Triage Vitals  Enc Vitals Group     BP 03/13/17 0143 (!) 142/72     Pulse Rate 03/13/17 0143 100     Resp 03/13/17 0143 16     Temp 03/13/17 0143 98.2 F (36.8 C)     Temp Source 03/13/17 0143 Oral     SpO2 03/13/17 0143 97 %     Weight 03/13/17 0133 191 lb (86.6 kg)     Height 03/13/17 0133 5\' 5"  (1.651 m)     Head Circumference --      Peak Flow --      Pain Score 03/13/17 0133 10     Pain Loc --      Pain Edu? --      Excl. in GC? --     Constitutional: Alert and oriented. Well appearing and in no acute distress. Eyes: Conjunctivae are normal. PERRL. EOMI. Head: Atraumatic. Nose: No congestion/rhinnorhea. Mouth/Throat: Mucous membranes are moist.  Oropharynx non-erythematous. Neck: No stridor.  No cervical spine tenderness to palpation. Cardiovascular: Normal rate, regular rhythm. Grossly normal heart sounds.  Good peripheral circulation. Respiratory: Normal respiratory effort.  No retractions. Lungs CTAB. Gastrointestinal: Obese. Soft and nontender. No distention. No abdominal bruits. No CVA tenderness. Musculoskeletal: Right great toe bandaged. Bandage removed to reveal foul-smelling toe ulcer with purulence. Palpable distal pulses. Symmetrically warm foot without evidence for ischemia. Neurologic:  Normal speech and language. No gross focal neurologic deficits are appreciated.  Skin:  Skin is warm, dry and intact. No rash noted. Psychiatric: Mood and affect are normal. Speech and behavior are  normal.  ____________________________________________   LABS (all labs ordered are listed, but only abnormal results are displayed)  Labs Reviewed  COMPREHENSIVE METABOLIC PANEL - Abnormal; Notable for the following:       Result Value   Sodium 133 (*)    Glucose, Bld 220 (*)    Total Bilirubin 1.5 (*)    All other components within normal limits  CBC WITH DIFFERENTIAL/PLATELET - Abnormal; Notable for the following:    WBC 14.4 (*)    Hemoglobin 11.7 (*)    RDW 14.8 (*)    Neutro Abs 12.8 (*)    Lymphs Abs 0.5 (*)    Monocytes Absolute 1.1 (*)    All other components within normal limits  LACTIC ACID, PLASMA - Abnormal; Notable for the following:    Lactic Acid, Venous 2.8 (*)    All other components within normal limits  CULTURE, BLOOD (ROUTINE X 2)  CULTURE, BLOOD (ROUTINE  X 2)  AEROBIC/ANAEROBIC CULTURE (SURGICAL/DEEP WOUND)  LACTIC ACID, PLASMA  TROPONIN I  URINALYSIS, COMPLETE (UACMP) WITH MICROSCOPIC  URINALYSIS, ROUTINE W REFLEX MICROSCOPIC   ____________________________________________  EKG  ED ECG REPORT I, Lancelot Alyea J, the attending physician, personally viewed and interpreted this ECG.   Date: 03/13/2017  EKG Time: 0139  Rate: 106  Rhythm: sinus tachycardia  Axis: LAD  Intervals: LAFB  ST&T Change: Nonspecific  ____________________________________________  RADIOLOGY  Dg Elbow Complete Right  Result Date: 03/13/2017 CLINICAL DATA:  Fall with elbow pain EXAM: RIGHT ELBOW - COMPLETE 3+ VIEW COMPARISON:  None. FINDINGS: Nonstandard lateral view limits assessment for effusion. No definite acute displaced fracture or dislocation is evident. IMPRESSION: Negative. Electronically Signed   By: Jasmine Pang M.D.   On: 03/13/2017 02:10   Dg Knee Complete 4 Views Right  Result Date: 03/13/2017 CLINICAL DATA:  Fall with knee pain EXAM: RIGHT KNEE - COMPLETE 4+ VIEW COMPARISON:  None. FINDINGS: No fracture or dislocation. Moderate patellofemoral degenerative  change with bony spurring. Marked degenerative changes of the medial compartment and mild to moderate degenerative changes of the lateral compartment. Vascular calcifications. Generalized soft tissue swelling. IMPRESSION: Moderate to marked degenerative changes. No acute osseous abnormality. Electronically Signed   By: Jasmine Pang M.D.   On: 03/13/2017 02:12    ____________________________________________   PROCEDURES  Procedure(s) performed: None  Procedures  Critical Care performed: No  ____________________________________________   INITIAL IMPRESSION / ASSESSMENT AND PLAN / ED COURSE  Pertinent labs & imaging results that were available during my care of the patient were reviewed by me and considered in my medical decision making (see chart for details).  76 year old female who presents with multiple falls; currently on doxycycline for diabetic toe ulcer. Patient has leukocytosis and lactate greater than 2. Will initiate ED code sepsis, administer IV antibiotics and discuss with hospitalist to evaluate patient in the emergency department for admission.      ____________________________________________   FINAL CLINICAL IMPRESSION(S) / ED DIAGNOSES  Final diagnoses:  Sepsis, due to unspecified organism (HCC)  Wound infection  Fall, initial encounter  Diabetic ulcer of toe of right foot associated with type 2 diabetes mellitus, limited to breakdown of skin (HCC)      NEW MEDICATIONS STARTED DURING THIS VISIT:  New Prescriptions   No medications on file     Note:  This document was prepared using Dragon voice recognition software and may include unintentional dictation errors.    Irean Hong, MD 03/13/17 0600

## 2017-03-13 NOTE — Progress Notes (Signed)
Patient arrived to floor at this time from ED. Patient is alert and oriented and able to verbalize needs. States pain in r knee with movement. Patient was oriented to staff, room, unit and call bell system. Skin check performed by two nurses. Patient voices no concerns at this time. Patient bathed and new gown put on. VSS stable. IV flushes without resistance. Bed in lowest position and bed alarm activated. Will continue to monitor.   Suzan SlickAlison L Crystle Carelli, RN

## 2017-03-13 NOTE — Consult Note (Signed)
                                                                                 Consultation Note Date: 03/13/2017   Patient Name: [name] [name]  DOB: 06/20/1941  MRN: [id number]  Age / Sex: 76 y.o., female  PCP: Kwaschyn, Katie, DO Referring Physician: Mody, Sital, MD  Reason for Consultation: Establishing goals of care  HPI/Patient Profile: 76 y.o. female  with past medical history of diabetes mellitus, diverticulitis, CHF, blind left eye, hypothyroidism, chronic PVD admitted on 03/13/2017 with septic right foot purulent draining wound with osteomyelitis. She has struggled with diabetic foot ulcers for a couple years now and has [facility] support at home.   Clinical Assessment and Goals of Care: I met today at [name] bedside and she is very lethargic but does awaken enough to answer my questions. She is alert and oriented x 3 although lethargic she is aware of what is going on. She was able to confirm desire for DNR and to have antibiotics. Although she could not remain awake for prolonged conversation regarding GOC. Mary Blake, NP [facility] came to bedside with official MOST and DNR forms which are placed on chart. DNR is consistent with patient's verbal desire now and in the past. Mary says that [name] was in the process of having support increased from [facility] with more home aides and going to [facility] center 3 x a week (she was only going 1 x week). Mary says that [name] really just enjoys being at home and this was the driving force behind her MOST form goals which did express she would not want to come to the hospital. Mary says that from their past conversations she did not believe that [name] would want aggressive care.  I also spoke on the phone with sister, [name]. [name] says that she has never discussed her sister's wishes with [name] or with [facility]. She also describes Ms. Cost as a homebody as she liked to be at home and mostly kept to herself. [name] says she rarely joined in  family functions. I explained my conversation with [name] regarding DNR and that this is consistent with her wishes as expressed with [facility] Mary Blake, NP. I asked [name] if she was surprised by these decisions from [name] and she says "maybe, I don't know." Informed that DNR will be in place based on my conversation and consistency of [name]'s wishes - [name] understands. All questions/concerns addressed.    Primary Decision Maker PATIENT    SUMMARY OF RECOMMENDATIONS   - DNR - Continue antibiotics with hope patient will become more alert to have more conversation regarding her wishes and GOC  Code Status/Advance Care Planning:  DNR   Symptom Management:   Infection/sepsis: Per primary team.  Denies pain/discomfort.   Palliative Prophylaxis:   Aspiration, Bowel Regimen, Delirium Protocol, Frequent Pain Assessment, Oral Care, Palliative Wound Care and Turn Reposition   Psycho-social/Spiritual:   Desire for further Chaplaincy support:no  Additional Recommendations: Caregiving  Support/Resources  Prognosis:   Unable to determine - based on goals and aggressiveness of care desired  Discharge Planning: To   Be Determined      Primary Diagnoses: Present on Admission: . Osteomyelitis ([place])   I have reviewed the medical record, interviewed the patient and family, and examined the patient. The following aspects are pertinent.  Past Medical History:  Diagnosis Date  . Allergic rhinitis   . Asthma   . Diabetes mellitus without complication ([place])    Type 2 with diabetic peripheral angiopath w/o gangrene  . Diverticulitis   . Hypertensive heart disease with heart failure ([place])   . Pain in right shoulder   . Primary open-angle glaucoma, bilateral, stage unspecified   . Thyroid disease    hypothroidism, unspecified  . Venous insufficiency (chronic) (peripheral)   . Vitamin D deficiency    Social History   Social History  . Marital status: Single     Spouse name: N/A  . Number of children: N/A  . Years of education: N/A   Social History Main Topics  . Smoking status: Never Smoker  . Smokeless tobacco: Never Used  . Alcohol use No  . Drug use: No  . Sexual activity: Not Asked   Other Topics Concern  . None   Social History Narrative  . None   Family History  Problem Relation Age of Onset  . Diabetes Mellitus II Mother    Scheduled Meds: . aspirin EC  81 mg Oral Daily  . brimonidine  1 drop Left Eye TID  . docusate sodium  100 mg Oral BID  . enalapril  20 mg Oral Daily  . insulin aspart  0-9 Units Subcutaneous TID WC  . latanoprost  1 drop Left Eye QHS  . levothyroxine  75 mcg Oral QAC breakfast  . timolol  1 drop Both Eyes BID  . Vitamin D (Ergocalciferol)  50,000 Units Oral Weekly   Continuous Infusions: . piperacillin-tazobactam (ZOSYN)  IV 3.375 g (03/13/17 1223)  . vancomycin Stopped (03/13/17 1130)   PRN Meds:.acetaminophen **OR** acetaminophen, loperamide, ondansetron **OR** ondansetron (ZOFRAN) IV No Known Allergies Review of Systems  Unable to perform ROS: Acuity of condition    Physical Exam  Constitutional: She is oriented to person, place, and time. She appears well-developed. She appears lethargic.  HENT:  Head: Normocephalic and atraumatic.  Cardiovascular: Normal rate and regular rhythm.   Pulmonary/Chest: Effort normal. No accessory muscle usage. No apnea and no tachypnea. No respiratory distress.  Abdominal: Soft. Normal appearance.  Neurological: She is oriented to person, place, and time. She appears lethargic.  Nursing note and vitals reviewed.   Vital Signs: BP (!) 144/59 (BP Location: Right Arm)   Pulse 99   Temp 98.7 F (37.1 C) (Oral)   Resp 16   Ht 5' 5" (1.651 m)   Wt 86.6 kg (191 lb)   SpO2 100%   BMI 31.78 kg/m  Pain Assessment: 0-10   Pain Score: 5    SpO2: SpO2: 100 % O2 Device:SpO2: 100 % O2 Flow Rate: .   IO: Intake/output summary:  Intake/Output Summary (Last  24 hours) at 03/13/17 1351 Last data filed at 03/13/17 0521  Gross per 24 hour  Intake             1050 ml  Output                0 ml  Net             1050 ml    LBM: Last BM Date: 03/13/17 Baseline Weight: Weight: 86.6 kg (191 lb) Most recent weight: Weight: 86.6 kg (  191 lb)     Palliative Assessment/Data: 20%    Time Total: 50min  Greater than 50%  of this time was spent counseling and coordinating care related to the above assessment and plan.  Signed by:  , NP Palliative Medicine Team Pager # [phone] (M-F 8a-5p) Team Phone # [phone] (Nights/Weekends)                

## 2017-03-13 NOTE — Plan of Care (Signed)
Problem: Safety: Goal: Ability to remain free from injury will improve Outcome: Progressing Ordered low bed for pt, bed in lowest position. Call bell within reach.

## 2017-03-13 NOTE — Progress Notes (Signed)
Patient briefly seen. Chart reviewed. Follow-up in podiatry consultation. Continue vancomycin and Zosyn.

## 2017-03-13 NOTE — H&P (Signed)
[name] [name] is an 76 y.o. female.   Chief Complaint: Fever HPI: The patient with past medical history of diabetes presents to the emergency department complaining of fever, although she explained to triage that she had right elbow and knee pain. Patient admits to a fall but did not hit her head. She has been dealing with a diabetic foot ulcer on her right foot that was being treated at the wound center. She cannot feel this wound but admits that she feels somewhat weak and occasionally nauseous which she recognizes as worsening infection in the wound. Emergency department staff was able to express purulent drainage from the base of the first metatarsal and obtain culture. The patient was given vancomycin and Zosyn at which point the emergency department staff called the hospitalist service for admission.  Past Medical History:  Diagnosis Date  . Allergic rhinitis   . Asthma   . Diabetes mellitus without complication ([place])    Type 2 with diabetic peripheral angiopath w/o gangrene  . Diverticulitis   . Hypertensive heart disease with heart failure ([place])   . Pain in right shoulder   . Primary open-angle glaucoma, bilateral, stage unspecified   . Thyroid disease    hypothroidism, unspecified  . Venous insufficiency (chronic) (peripheral)   . Vitamin D deficiency     Past Surgical History:  Procedure Laterality Date  . none      Family History  Problem Relation Age of Onset  . Diabetes Mellitus II Mother    Social History:  reports that she has never smoked. She has never used smokeless tobacco. She reports that she does not drink alcohol or use drugs.  Allergies: No Known Allergies  Medications Prior to Admission  Medication Sig Dispense Refill  . acetaminophen (TYLENOL) 325 MG tablet Take 650 mg by mouth every 6 (six) hours as needed for mild pain.    Marland Kitchen aspirin EC 81 MG tablet Take 81 mg by mouth daily.    . bimatoprost (LUMIGAN) 0.03 % ophthalmic solution Place 1 drop into the  left eye at bedtime.    . brimonidine (ALPHAGAN) 0.15 % ophthalmic solution Place 1 drop into the left eye 3 (three) times daily.    . ciprofloxacin (CIPRO IN D5W) 400 MG/200ML SOLN Inject 400 mg into the vein every 12 (twelve) hours.    . ciprofloxacin (CIPRO) 400 MG/40ML SOLN injection Inject 400 mg into the vein every 12 (twelve) hours.    . ciprofloxacin (CIPRO) 500 MG tablet Take 500 mg by mouth 2 (two) times daily.    . clindamycin (CLEOCIN IN D5W) 600 MG/50ML IVPB Inject 600 mg into the vein.    . clindamycin (CLEOCIN) 150 MG capsule Take 600 mg by mouth every 6 (six) hours.    . Clindamycin Phosphate (CLEOCIN PHOSPHATE) 600 MG/4ML SOLN Inject 600 mg into the vein every 6 (six) hours.    . enalapril (VASOTEC) 20 MG tablet Take 20 mg by mouth daily.    . ergocalciferol (DRISDOL) 50000 UNITS capsule Take 50,000 Units by mouth once a week.    . levothyroxine (SYNTHROID, LEVOTHROID) 75 MCG tablet Take 75 mcg by mouth daily before breakfast.    . loperamide (IMODIUM A-D) 2 MG tablet Take 2 mg by mouth 4 (four) times daily as needed for diarrhea or loose stools.    . metFORMIN (GLUCOPHAGE) 850 MG tablet Take 850 mg by mouth 2 (two) times daily with a meal.    . timolol (BETIMOL) 0.5 % ophthalmic solution Place 1  drop into both eyes 2 (two) times daily.      Results for orders placed or performed during the hospital encounter of 03/13/17 (from the past 48 hour(s))  Lactic acid, plasma     Status: Abnormal   Collection Time: 03/13/17  2:08 AM  Result Value Ref Range   Lactic Acid, Venous 2.8 (HH) 0.5 - 1.9 mmol/L    Comment: CRITICAL RESULT CALLED TO, READ BACK BY AND VERIFIED WITH JENNA ELLINGTON @ 0258 ON 03/13/2017 BY [facility]   Urinalysis, Complete w Microscopic     Status: Abnormal   Collection Time: 03/13/17  2:30 AM  Result Value Ref Range   Color, Urine YELLOW (A) YELLOW   APPearance CLEAR (A) CLEAR   Specific Gravity, Urine 1.021 1.005 - 1.030   pH 5.0 5.0 - 8.0   Glucose, UA 150 (A)  NEGATIVE mg/dL   Hgb urine dipstick SMALL (A) NEGATIVE   Bilirubin Urine NEGATIVE NEGATIVE   Ketones, ur 20 (A) NEGATIVE mg/dL   Protein, ur 30 (A) NEGATIVE mg/dL   Nitrite NEGATIVE NEGATIVE   Leukocytes, UA NEGATIVE NEGATIVE   RBC / HPF 0-5 0 - 5 RBC/hpf   WBC, UA 0-5 0 - 5 WBC/hpf   Bacteria, UA NONE SEEN NONE SEEN   Squamous Epithelial / LPF 0-5 (A) NONE SEEN  Comprehensive metabolic panel     Status: Abnormal   Collection Time: 03/13/17  2:31 AM  Result Value Ref Range   Sodium 133 (L) 135 - 145 mmol/L   Potassium 4.5 3.5 - 5.1 mmol/L   Chloride 101 101 - 111 mmol/L   CO2 22 22 - 32 mmol/L   Glucose, Bld 220 (H) 65 - 99 mg/dL   BUN 18 6 - 20 mg/dL   Creatinine, Ser 0.85 0.44 - 1.00 mg/dL   Calcium 9.4 8.9 - 10.3 mg/dL   Total Protein 7.5 6.5 - 8.1 g/dL   Albumin 4.0 3.5 - 5.0 g/dL   AST 27 15 - 41 U/L   ALT 15 14 - 54 U/L   Alkaline Phosphatase 56 38 - 126 U/L   Total Bilirubin 1.5 (H) 0.3 - 1.2 mg/dL   GFR calc non Af Amer >60 >60 mL/min   GFR calc Af Amer >60 >60 mL/min    Comment: (NOTE) The eGFR has been calculated using the CKD EPI equation. This calculation has not been validated in all clinical situations. eGFR's persistently <60 mL/min signify possible Chronic Kidney Disease.    Anion gap 10 5 - 15  CBC with Differential     Status: Abnormal   Collection Time: 03/13/17  2:31 AM  Result Value Ref Range   WBC 14.4 (H) 3.6 - 11.0 K/uL   RBC 4.43 3.80 - 5.20 MIL/uL   Hemoglobin 11.7 (L) 12.0 - 16.0 g/dL   HCT 35.7 35.0 - 47.0 %   MCV 80.6 80.0 - 100.0 fL   MCH 26.5 26.0 - 34.0 pg   MCHC 32.8 32.0 - 36.0 g/dL   RDW 14.8 (H) 11.5 - 14.5 %   Platelets 281 150 - 440 K/uL   Neutrophils Relative % 88 %   Neutro Abs 12.8 (H) 1.4 - 6.5 K/uL   Lymphocytes Relative 4 %   Lymphs Abs 0.5 (L) 1.0 - 3.6 K/uL   Monocytes Relative 8 %   Monocytes Absolute 1.1 (H) 0.2 - 0.9 K/uL   Eosinophils Relative 0 %   Eosinophils Absolute 0.0 0 - 0.7 K/uL   Basophils Relative 0 %  Basophils Absolute 0.0 0 - 0.1 K/uL  Troponin I     Status: Abnormal   Collection Time: 03/13/17  2:31 AM  Result Value Ref Range   Troponin I 0.09 (HH) <0.03 ng/mL    Comment: CRITICAL RESULT CALLED TO, READ BACK BY AND VERIFIED WITH JENNA ELLINGTON @ 9211 ON 6/29 2018 BY CAF   Lactic acid, plasma     Status: None   Collection Time: 03/13/17  5:17 AM  Result Value Ref Range   Lactic Acid, Venous 1.7 0.5 - 1.9 mmol/L   Dg Elbow Complete Right  Result Date: 03/13/2017 CLINICAL DATA:  Fall with elbow pain EXAM: RIGHT ELBOW - COMPLETE 3+ VIEW COMPARISON:  None. FINDINGS: Nonstandard lateral view limits assessment for effusion. No definite acute displaced fracture or dislocation is evident. IMPRESSION: Negative. Electronically Signed   By: Donavan Foil M.D.   On: 03/13/2017 02:10   Dg Knee Complete 4 Views Right  Result Date: 03/13/2017 CLINICAL DATA:  Fall with knee pain EXAM: RIGHT KNEE - COMPLETE 4+ VIEW COMPARISON:  None. FINDINGS: No fracture or dislocation. Moderate patellofemoral degenerative change with bony spurring. Marked degenerative changes of the medial compartment and mild to moderate degenerative changes of the lateral compartment. Vascular calcifications. Generalized soft tissue swelling. IMPRESSION: Moderate to marked degenerative changes. No acute osseous abnormality. Electronically Signed   By: Donavan Foil M.D.   On: 03/13/2017 02:12    Review of Systems  Constitutional: Negative for chills and fever.  HENT: Negative for sore throat and tinnitus.   Eyes: Negative for blurred vision and redness.  Respiratory: Negative for cough and shortness of breath.   Cardiovascular: Negative for chest pain, palpitations, orthopnea and PND.  Gastrointestinal: Negative for abdominal pain, diarrhea, nausea and vomiting.  Genitourinary: Negative for dysuria, frequency and urgency.  Musculoskeletal: Negative for joint pain and myalgias.       Draining foot ulcer  Skin: Negative for  rash.       No lesions  Neurological: Negative for speech change, focal weakness and weakness.  Endo/Heme/Allergies: Does not bruise/bleed easily.       No temperature intolerance  Psychiatric/Behavioral: Negative for depression and suicidal ideas.    Blood pressure 113/60, pulse 94, temperature 98.2 F (36.8 C), temperature source Oral, resp. rate 17, height '5\' 5"'  (1.651 m), weight 86.6 kg (191 lb), SpO2 97 %. Physical Exam  Vitals reviewed. Constitutional: She is oriented to person, place, and time. She appears well-developed and well-nourished. No distress.  HENT:  Head: Normocephalic and atraumatic.  Mouth/Throat: Oropharynx is clear and moist.  Eyes: Conjunctivae and EOM are normal. Pupils are equal, round, and reactive to light. No scleral icterus.  Neck: Normal range of motion. Neck supple. No JVD present. No tracheal deviation present. No thyromegaly present.  Cardiovascular: Normal rate, regular rhythm and normal heart sounds.  Exam reveals no gallop and no friction rub.   No murmur heard. Respiratory: Effort normal and breath sounds normal.  GI: Soft. Bowel sounds are normal. She exhibits no distension. There is no tenderness.  Genitourinary:  Genitourinary Comments: Atrophic vaginitis  Musculoskeletal: Normal range of motion. She exhibits no edema.  Lymphadenopathy:    She has no cervical adenopathy.  Neurological: She is alert and oriented to person, place, and time. No cranial nerve deficit. She exhibits normal muscle tone.  Skin: Skin is warm and dry. No rash noted. No erythema.  Psychiatric: She has a normal mood and affect. Her behavior is normal. Judgment and thought content  normal.     Assessment/Plan This is a 76 year old female admitted for osteomyelitis. 1. Osteomyelitis: Continue vancomycin and Zosyn. Podiatry consulted for debridement. 2. 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 Continue to follow cardiac biomarkers. Consult cardiology if needed. 3. Diabetes mellitus type 2: Hold metformin. Sliding suicidal hospitalized. 4. Hypothyroidism: Continue Synthroid. Check TSH 5. Hypertension: Controlled; continue ACE inhibitor 6. Glaucoma: Continue eyedrops 7. DVT prophylaxis: SCDs 8. GI prophylaxis: None The patient is a full code. Time spent on admission orders and patient care approximately 45 minutes  Harrie Foreman, MD 03/13/2017, 7:12 AM

## 2017-03-13 NOTE — ED Notes (Signed)
Patient transported to X-ray 

## 2017-03-14 LAB — GLUCOSE, CAPILLARY
Glucose-Capillary: 110 mg/dL — ABNORMAL HIGH (ref 65–99)
Glucose-Capillary: 138 mg/dL — ABNORMAL HIGH (ref 65–99)
Glucose-Capillary: 141 mg/dL — ABNORMAL HIGH (ref 65–99)
Glucose-Capillary: 168 mg/dL — ABNORMAL HIGH (ref 65–99)

## 2017-03-14 LAB — HEMOGLOBIN A1C
Hgb A1c MFr Bld: 6.2 % — ABNORMAL HIGH (ref 4.8–5.6)
Mean Plasma Glucose: 131 mg/dL

## 2017-03-14 NOTE — Evaluation (Signed)
Physical Therapy Evaluation Patient Details Name: [name] [name] MRN: [id number] DOB: 20-May-1941 Today's Date: 03/14/2017   History of Present Illness  76 yo Female came to ED with infection in wound on plantar surface of right foot along great toe and met heads. Patient diagnosed with osteomyelitis. She declined surgical intervention and is wishing to treat wound conservatively. Patient is NWB on RLE to avoid additional damage to wound;   Clinical Impression  76 yo Female with osteomyelitis on plantar surface of right foot reports increased fear of falling. Patient was living at home in an apartment alone. She does report getting home health services through [facility]. She was getting wound care in an outpatient facility. Patient exhibits significant weakness in BLE. She was very fearful of falling and had significant difficulty transitioning into sitting edge of bed. Patient required max A +2 for supine<>sitting and scooting. She exhibits a heavy posterior lean while sitting due to fear of falling. Unable to transfer or ambulate at this time. Due to decline in functional mobility PT recommends short term rehab for better care and mobility. Patient would benefit from additional skilled PT intervention to improve strength, balance and gait safety;     Follow Up Recommendations SNF;Supervision/Assistance - 24 hour    Equipment Recommendations  Other (comment) (to be determined post discharge; )    Recommendations for Other Services       Precautions / Restrictions Precautions Precautions: Fall Restrictions Weight Bearing Restrictions: Yes RLE Weight Bearing: Non weight bearing (per nursing)      Mobility  Bed Mobility Overal bed mobility: Needs Assistance Bed Mobility: Supine to Sit;Sit to Supine     Supine to sit: Max assist Sit to supine: Max assist;+2 for physical assistance   General bed mobility comments: patient required several cues for hand placement and LE positioning for  transitioning supine<>sitting; She required max A +1 for sitting edge of bed with head of bed elevated; she required max A +2 for lying supine due to decreased trunk control and weakness; Patient required physical assistance for moving RLE off bed and assistance for scooting edge of bed; She exhibited a heavy posterior lean due to fear of falling;   Transfers                 General transfer comment: not assessed due to weakness and fear of falling;   Ambulation/Gait             General Gait Details: unable at this time;   Stairs            Wheelchair Mobility    Modified Rankin (Stroke Patients Only)       Balance Overall balance assessment: Needs assistance Sitting-balance support: Bilateral upper extremity supported;Feet supported Sitting balance-Leahy Scale: Poor Sitting balance - Comments: heavy posterior lean requiring mod-max A for holding sitting position; Able to initiate forward lean but unable to hold position;                                      Pertinent Vitals/Pain Pain Assessment: No/denies pain (no pain at rest; )    Home Living Family/patient expects to be discharged to:: Private residence Living Arrangements: Alone Available Help at Discharge: Other (Comment) (has [facility] 2x a week; ) Type of Home: Apartment Home Access: Level entry     Home Layout: One level Home Equipment: Walker - 2 wheels;Cane - single point;Shower  seat;Grab bars - tub/shower Additional Comments: used RW for transfers/gait; she used shower chair;     Prior Function Level of [place]: Needs assistance   Gait / Transfers Assistance Needed: used RW, was limited in gait tasks due to wound on plantar surface of right foot  ADL's / Homemaking Assistance Needed: had home aide that came 2x a week to assist with bathing; used shower seat  Comments: patient was getting wound care and home health services through [facility]; she lives alone in  [address] apartment;      Hand Dominance        Extremity/Trunk Assessment   Upper Extremity Assessment Upper Extremity Assessment: Overall WFL for tasks assessed    Lower Extremity Assessment Lower Extremity Assessment: RLE deficits/detail;LLE deficits/detail RLE Deficits / Details: limited, hip and knee ROM is limited due to weakness, strength grossly 3-/5; intact light touch sensation above knee;  LLE Deficits / Details: strength is grossly 3+/5, intact light touch sensation throughout LE    Cervical / Trunk Assessment Cervical / Trunk Assessment: Kyphotic  Communication   Communication: No difficulties  Cognition Arousal/Alertness: Lethargic Behavior During Therapy: WFL for tasks assessed/performed Overall Cognitive Status: Within Functional Limits for tasks assessed                                 General Comments: patient was tired and fearful of falling during PT evaluation often asking, "Can I lay back down, I don't want to fall"      General Comments General comments (skin integrity, edema, etc.): patient is blind in left eye; has decreased vision out of right eye;     Exercises     Assessment/Plan    PT Assessment Patient needs continued PT services  PT Problem List Decreased strength;Decreased mobility;Decreased safety awareness;Decreased range of motion;Obesity;Decreased activity tolerance;Decreased balance       PT Treatment Interventions Therapeutic activities;DME instruction;Gait training;Therapeutic exercise;Patient/family education;Balance training;Functional mobility training;Neuromuscular re-education    PT Goals (Current goals can be found in the Care Plan section)  Acute Rehab PT Goals Patient Stated Goal: Patient wishses to go back to apartment and get services [facility]; "I just don't want to fall" PT Goal Formulation: With patient Time For Goal Achievement: 03/28/17 Potential to Achieve Goals: Fair    Frequency Min  2X/week   Barriers to discharge Decreased caregiver support lives alone; does have assistance through [facility] but they only come 2x a week per patient;     Co-evaluation               AM-PAC PT "6 Clicks" Daily Activity  Outcome Measure Difficulty turning over in bed (including adjusting bedclothes, sheets and blankets)?: Total Difficulty moving from lying on back to sitting on the side of the bed? : Total Difficulty sitting down on and standing up from a chair with arms (e.g., wheelchair, bedside commode, etc,.)?: Total Help needed moving to and from a bed to chair (including a wheelchair)?: Total Help needed walking in hospital room?: Total Help needed climbing 3-5 steps with a railing? : Total 6 Click Score: 6    End of Session   Activity Tolerance: Patient limited by fatigue;Other (comment) (limited by fear of falling; ) Patient left: in bed;with call bell/phone within reach;with bed alarm set;with SCD's reapplied Nurse Communication: Mobility status PT Visit Diagnosis: Unsteadiness on feet (R26.81);Muscle weakness (generalized) (M62.81)    Time: 8250-0370 PT Time Calculation (min) (ACUTE ONLY): 28  min   Charges:   PT Evaluation $PT Eval Low Complexity: 1 Procedure     PT G Codes:          Charolett Yarrow PT, DPT 03/14/2017, 3:38 PM

## 2017-03-14 NOTE — Progress Notes (Signed)
Dressing changed after wound assessment. Pt complains of pain to palpation of wounds on distal toes/ball of foot and also with cracked area near heel on bottom of foot. Tissue appears boggy, noted with crepitus and "popping' sensation with manipulation of foot during dressing change. No drainage noted from wound, applied telfa pad to prevent sticking. Gauze and kerlix applied. Will notify MD at rounds in AM.

## 2017-03-14 NOTE — Progress Notes (Signed)
[facility] - [place] [facility]   PATIENT NAME: [name] [name]    MR#:  [id number]  DATE OF BIRTH:  Aug 27, 1941  SUBJECTIVE:   Patient does not want surgical evaluation. She wants to continue in a biotics and local wound care  REVIEW OF SYSTEMS:    Review of Systems  Constitutional: Negative for fever, chills weight loss HENT: Negative for ear pain, nosebleeds, congestion, facial swelling, rhinorrhea, neck pain, neck stiffness and ear discharge.   Respiratory: Negative for cough, shortness of breath, wheezing  Cardiovascular: Negative for chest pain, palpitations and leg swelling.  Gastrointestinal: Negative for heartburn, abdominal pain, vomiting, diarrhea or consitpation Genitourinary: Negative for dysuria, urgency, frequency, hematuria Musculoskeletal: Negative for back pain or joint pain Neurological: Negative for dizziness, seizures, syncope, focal weakness,  numbness and headaches.  Hematological: Does not bruise/bleed easily.  Psychiatric/Behavioral: Negative for hallucinations, confusion, dysphoric mood Skin: Right diabetic foot and flexion   Tolerating Diet: yes      DRUG ALLERGIES:  No Known Allergies  VITALS:  Blood pressure (!) 144/59, pulse 99, temperature 98.7 F (37.1 C), temperature source Oral, resp. rate 16, height 5\' 5"  (1.651 m), weight 90.7 kg (200 lb), SpO2 100 %.  PHYSICAL EXAMINATION:  Constitutional: Appears well-developed and well-nourished. No distress. HENT: Normocephalic. Marland Kitchen Oropharynx is clear and moist.  Eyes: Conjunctivae and EOM are normal. PERRLA, no scleral icterus.  Neck: Normal ROM. Neck supple. No JVD. No tracheal deviation. CVS: RRR, S1/S2 +, no murmurs, no gallops, no carotid bruit.  Pulmonary: Effort and breath sounds normal, no stridor, rhonchi, wheezes, rales.  Abdominal: Soft. BS +,  no distension, tenderness, rebound or guarding.  Musculoskeletal: Normal range of motion. No edema and no tenderness.  Neuro:  Alert. CN 2-12 grossly intact. No focal deficits. Skin: Foot is wrapped  Psychiatric: Normal mood and affect.      LABORATORY PANEL:   CBC  Recent Labs Lab 03/13/17 0231  WBC 14.4*  HGB 11.7*  HCT 35.7  PLT 281   ------------------------------------------------------------------------------------------------------------------  Chemistries   Recent Labs Lab 03/13/17 0231  NA 133*  K 4.5  CL 101  CO2 22  GLUCOSE 220*  BUN 18  CREATININE 0.85  CALCIUM 9.4  AST 27  ALT 15  ALKPHOS 56  BILITOT 1.5*   ------------------------------------------------------------------------------------------------------------------  Cardiac Enzymes  Recent Labs Lab 03/13/17 0231  TROPONINI 0.09*   ------------------------------------------------------------------------------------------------------------------  RADIOLOGY:  Dg Elbow Complete Right  Result Date: 03/13/2017 CLINICAL DATA:  Fall with elbow pain EXAM: RIGHT ELBOW - COMPLETE 3+ VIEW COMPARISON:  None. FINDINGS: Nonstandard lateral view limits assessment for effusion. No definite acute displaced fracture or dislocation is evident. IMPRESSION: Negative. Electronically Signed   By: Jasmine Pang M.D.   On: 03/13/2017 02:10   Dg Knee Complete 4 Views Right  Result Date: 03/13/2017 CLINICAL DATA:  Fall with knee pain EXAM: RIGHT KNEE - COMPLETE 4+ VIEW COMPARISON:  None. FINDINGS: No fracture or dislocation. Moderate patellofemoral degenerative change with bony spurring. Marked degenerative changes of the medial compartment and mild to moderate degenerative changes of the lateral compartment. Vascular calcifications. Generalized soft tissue swelling. IMPRESSION: Moderate to marked degenerative changes. No acute osseous abnormality. Electronically Signed   By: Jasmine Pang M.D.   On: 03/13/2017 02:12   Dg Foot Complete Right  Result Date: 03/13/2017 CLINICAL DATA:  Soft tissue ulceration of the right foot. EXAM: RIGHT FOOT  COMPLETE - 3+ VIEW COMPARISON:  None. FINDINGS: And extensive erosion of the medial aspect of  the head of the first metatarsal. There is dislocation at the first metatarsal phalangeal joint. There is no other acute abnormality. Slight dorsal spurring in the midfoot. IMPRESSION: Findings consistent with osteomyelitis of the head of the first metatarsal. Dislocation at the first metatarsal phalangeal joint. Electronically Signed   By: Francene BoyersJames  Maxwell M.D.   On: 03/13/2017 16:25     ASSESSMENT AND PLAN:   76 year old female with diabetes who is followed by wound care who presents with full-thickness diabetic foot ulceration with abscess infection of right great toe joint.  1. Osteomyelitis of the head of the first metatarsal right foot: Wound culture growing GPC's/GNR Patient does not want surgical evaluation Continue local wound care and antibiotics Follow-up on final cultures Podiatry consultation appreciated   2. Diabetes: Continue sliding scale  3. Essential hypertension: Continue enalapril  4. Hypothyroid: Continue Synthroid 5. Glaucoma: Continue eyedrops 6. Elevated troponin due to demand ischemia.  Patient is PT evaluation for disposition [facility]      Management plans discussed with the patient and she is in agreement.  CODE STATUS: dnr  TOTAL TIME TAKING CARE OF THIS PATIENT: 30 minutes.     POSSIBLE D/C tomorrow, DEPENDING ON CLINICAL CONDITION.   Charli Halle M.D on 03/14/2017 at 8:17 AM  Between 7am to 6pm - Pager - [phone] After 6pm go to [email or web address] - [email or web address]  [facility]  Office  [phone]  CC: Primary care physician; Idelle LeechKwaschyn, Katie, DO  Note: This dictation was prepared with Dragon dictation along with smaller phrase technology. Any transcriptional errors that result from this process are unintentional.

## 2017-03-15 LAB — CBC
HCT: 33.5 % — ABNORMAL LOW (ref 35.0–47.0)
Hemoglobin: 11.2 g/dL — ABNORMAL LOW (ref 12.0–16.0)
MCH: 27.3 pg (ref 26.0–34.0)
MCHC: 33.3 g/dL (ref 32.0–36.0)
MCV: 82 fL (ref 80.0–100.0)
PLATELETS: 245 10*3/uL (ref 150–440)
RBC: 4.09 MIL/uL (ref 3.80–5.20)
RDW: 15.3 % — AB (ref 11.5–14.5)
WBC: 6.8 10*3/uL (ref 3.6–11.0)

## 2017-03-15 LAB — BASIC METABOLIC PANEL
ANION GAP: 5 (ref 5–15)
BUN: 10 mg/dL (ref 6–20)
CALCIUM: 8.5 mg/dL — AB (ref 8.9–10.3)
CO2: 25 mmol/L (ref 22–32)
Chloride: 106 mmol/L (ref 101–111)
Creatinine, Ser: 0.87 mg/dL (ref 0.44–1.00)
GFR calc Af Amer: >60 mL/min (ref >60)
GLUCOSE: 143 mg/dL — AB (ref 65–99)
POTASSIUM: 3.8 mmol/L (ref 3.5–5.1)
SODIUM: 136 mmol/L (ref 135–145)

## 2017-03-15 LAB — GLUCOSE, CAPILLARY
Glucose-Capillary: 128 mg/dL — ABNORMAL HIGH (ref 65–99)
Glucose-Capillary: 151 mg/dL — ABNORMAL HIGH (ref 65–99)

## 2017-03-15 LAB — TROPONIN I
Troponin I: 0.04 ng/mL (ref <0.03)
Troponin I: <0.03 ng/mL (ref <0.03)

## 2017-03-15 MED ORDER — OXYCODONE-ACETAMINOPHEN 5-325 MG PO TABS: 1.0000 | ORAL_TABLET | Freq: Four times a day (QID) | ORAL | 0 refills | Status: AC | PRN

## 2017-03-15 MED ORDER — AMOXICILLIN-POT CLAVULANATE 875-125 MG PO TABS: 1.0000 | ORAL_TABLET | Freq: Two times a day (BID) | ORAL | 0 refills | Status: AC

## 2017-03-15 MED ORDER — CIPROFLOXACIN HCL 500 MG PO TABS: 500.0000 mg | ORAL_TABLET | Freq: Two times a day (BID) | ORAL | 0 refills | Status: AC

## 2017-03-15 NOTE — Progress Notes (Signed)
Spoke with family they will talk to patient about getting surgery for her foot. Patient herself does not want any surgeries, but they will try to convince her.

## 2017-03-15 NOTE — Clinical Social Work Note (Addendum)
CSW has called [facility] to discuss discharge planning. The on-call RN is going to discuss with the on-call provider. Should they decide to follow the recommendation by PT to discharge to SNF, the patient may not be able to do so until Monday.   [facility] is reviewing the patient for possible discharge to them today. CSW is following.  Argentina PonderKaren Martha Persais Ethridge, MSW, Theresia MajorsLCSWA [phone]

## 2017-03-15 NOTE — NC FL2 (Signed)
[place] MEDICAID FL2 LEVEL OF CARE SCREENING TOOL     IDENTIFICATION  Patient Name: [name] [name] Birthdate: [birth year] Sex: female Admission Date (Current Location): 03/13/2017  [place] and [place] Number:  Chiropodist and Address:  [facility], [address], [place], [place] [address]      Provider Number: [id number]  Attending Physician Name and Address:  Adrian Saran, MD  Relative Name and Phone Number:       Current Level of Care: Hospital Recommended Level of Care: Skilled Nursing Facility Prior Approval Number:    Date Approved/Denied:   PASRR Number: [id number]  Discharge Plan: SNF    Current Diagnoses: Patient Active Problem List   Diagnosis Date Noted  . Osteomyelitis (HCC) 03/13/2017  . Diabetic ulcer of toe of right foot associated with type 2 diabetes mellitus, limited to breakdown of skin (HCC)   . DNR (do not resuscitate)   . Goals of care, counseling/discussion   . Palliative care encounter   . Diabetic foot ulcer with osteomyelitis (HCC) 07/06/2015    Orientation RESPIRATION BLADDER Height & Weight     Self, Time, Situation  Normal Continent Weight: 205 lb (93 kg) Height:  5\' 5"  (165.1 cm)  BEHAVIORAL SYMPTOMS/MOOD NEUROLOGICAL BOWEL NUTRITION STATUS      Continent Diet (Carb modified)  AMBULATORY STATUS COMMUNICATION OF NEEDS Skin   Extensive Assist Verbally Other (Comment) (Foot Ulcer)                       Personal Care Assistance Level of Assistance  Bathing, Feeding, Dressing Bathing Assistance: Limited assistance Feeding assistance: Independent Dressing Assistance: Limited assistance     Functional Limitations Info             SPECIAL CARE FACTORS FREQUENCY  PT (By licensed PT)     PT Frequency: Up to 5X per day, 5 days per week              Contractures Contractures Info: Present    Additional Factors Info  Code Status Code Status Info: DNR             Current  Medications (03/15/2017):  This is the current hospital active medication list Current Facility-Administered Medications  Medication Dose Route Frequency Provider Last Rate Last Dose  . acetaminophen (TYLENOL) tablet 650 mg  650 mg Oral Q6H PRN Arnaldo Natal, MD   650 mg at 03/15/17 1308   Or  . acetaminophen (TYLENOL) suppository 650 mg  650 mg Rectal Q6H PRN Arnaldo Natal, MD      . aspirin EC tablet 81 mg  81 mg Oral Daily Arnaldo Natal, MD   81 mg at 03/15/17 6578  . brimonidine (ALPHAGAN) 0.15 % ophthalmic solution 1 drop  1 drop Left Eye TID Arnaldo Natal, MD   1 drop at 03/15/17 0815  . docusate sodium (COLACE) capsule 100 mg  100 mg Oral BID Arnaldo Natal, MD   100 mg at 03/15/17 4696  . enalapril (VASOTEC) tablet 20 mg  20 mg Oral Daily Arnaldo Natal, MD   20 mg at 03/15/17 2952  . insulin aspart (novoLOG) injection 0-9 Units  0-9 Units Subcutaneous TID WC Adrian Saran, MD   1 Units at 03/15/17 0814  . latanoprost (XALATAN) 0.005 % ophthalmic solution 1 drop  1 drop Left Eye QHS Arnaldo Natal, MD   1 drop at 03/14/17 2134  . levothyroxine (SYNTHROID, LEVOTHROID) tablet 75  mcg  75 mcg Oral QAC breakfast Arnaldo Nataliamond, Michael S, MD   75 mcg at 03/15/17 [phone]  . loperamide (IMODIUM) capsule 2 mg  2 mg Oral QID PRN Arnaldo Nataliamond, Michael S, MD      . ondansetron [facility](ZOFRAN) tablet 4 mg  4 mg Oral Q6H PRN Arnaldo Nataliamond, Michael S, MD       Or  . ondansetron [facility](ZOFRAN) injection 4 mg  4 mg Intravenous Q6H PRN Arnaldo Nataliamond, Michael S, MD      . piperacillin-tazobactam (ZOSYN) IVPB 3.375 g  3.375 g Intravenous Q8H Irean HongSung, Jade J, MD   Stopped at 03/15/17 [phone]  . timolol (TIMOPTIC) 0.5 % ophthalmic solution 1 drop  1 drop Both Eyes BID Arnaldo Nataliamond, Michael S, MD   1 drop at 03/15/17 [phone]  . vancomycin (VANCOCIN) 1,250 mg in sodium chloride 0.9 % 250 mL IVPB  1,250 mg Intravenous Q18H Irean HongSung, Jade J, MD   Stopped at 03/14/17 2324  . Vitamin D (Ergocalciferol) (DRISDOL) capsule 50,000 Units  50,000  Units Oral Weekly Arnaldo Nataliamond, Michael S, MD   50,000 Units at 03/13/17 [id number]     Discharge Medications: Please see discharge summary for a list of discharge medications.  Relevant Imaging Results:  Relevant Lab Results:   Additional Information SS# [id number]  Judi CongKaren M Moselle Rister, LCSW

## 2017-03-15 NOTE — Discharge Summary (Signed)
[facility] [place] [facility]   PATIENT NAME: [name] SpittleMelissa [name]    MR#:  [id number]  DATE OF BIRTH:  01-20-1941  DATE OF ADMISSION:  03/13/2017 ADMITTING PHYSICIAN: [name] NatalMichael S Diamond, MD  DATE OF DISCHARGE: 03/15/2017  PRIMARY CARE PHYSICIAN: [name] [name], Katie, DO    ADMISSION DIAGNOSIS:  Wound infection [T14.8XXA, L08.9] Fall, initial encounter [id number][W19.XXXA] Sepsis, due to unspecified organism [facility](HCC) [A41.9] Diabetic ulcer of toe of right foot associated with type 2 diabetes mellitus, limited to breakdown of skin (HCC) [N82.956[E11.621, L97.511]  DISCHARGE DIAGNOSIS:  Active Problems:   Osteomyelitis (HCC)   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 Allergic rhinitis   . Asthma   . 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 Diverticulitis   . Hypertensive heart disease with heart failure (HCC)   . Pain in right shoulder   . Primary open-angle glaucoma, bilateral, stage unspecified   . Thyroid disease    hypothroidism, unspecified  . Venous insufficiency (chronic) (peripheral)   . Vitamin D deficiency     HOSPITAL COURSE:   76 year old female with diabetes who is followed by wound care who presents with full-thickness diabetic foot ulceration with abscess infection of right great toe joint.  1. 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 Diabetes: Continue ADA  diet and outpatient medications  3. Essential hypertension: Continue enalapril  4. Hypothyroid: Continue Synthroid 5. Glaucoma: Continue eyedrops 6. Elevated troponin due to demand ischemia.   DISCHARGE CONDITIONS AND DIET:   Stable Diabetic diet Weight bearing heel-toe   CONSULTS OBTAINED:    DRUG ALLERGIES:  No Known Allergies  DISCHARGE MEDICATIONS:   Current Discharge Medication List    START taking these medications   Details  amoxicillin-clavulanate (AUGMENTIN) 875-125 MG tablet Take 1 tablet by mouth 2 (two) times daily. Qty: 16 tablet, Refills: 0    oxyCODONE-acetaminophen (ROXICET) 5-325 MG tablet Take 1 tablet by mouth every 6 (six) hours as needed. Qty: 20 tablet, Refills: 0      CONTINUE these medications which have CHANGED   Details  ciprofloxacin (CIPRO) 500 MG tablet Take 1 tablet (500 mg total) by mouth 2 (two) times daily. Qty: 56 tablet, Refills: 0      CONTINUE these medications which have NOT CHANGED   Details  aspirin EC 81 MG tablet Take 81 mg by mouth daily.    acetaminophen (TYLENOL) 325 MG tablet Take 650 mg by mouth every 6 (six) hours as needed for mild pain.    bimatoprost (LUMIGAN) 0.03 % ophthalmic solution Place 1 drop into the left eye at bedtime.    brimonidine (ALPHAGAN) 0.15 % ophthalmic solution Place 1 drop into the left eye 3 (three) times daily.    enalapril (VASOTEC) 20 MG tablet Take 20 mg by mouth daily.    ergocalciferol (DRISDOL) 50000 UNITS capsule Take 50,000 Units by mouth once a week.  levothyroxine (SYNTHROID, LEVOTHROID) 75 MCG tablet Take 75 mcg by mouth daily before breakfast.    loperamide (IMODIUM A-D) 2 MG tablet Take 2 mg by mouth 4 (four) times daily as needed for diarrhea or loose stools.    metFORMIN (GLUCOPHAGE) 850 MG tablet Take 850 mg by mouth 2 (two) times daily with a meal.    timolol (BETIMOL) 0.5 % ophthalmic solution Place 1 drop into both eyes 2 (two) times daily.      STOP taking  these medications     ciprofloxacin (CIPRO IN D5W) 400 MG/200ML SOLN      ciprofloxacin (CIPRO) 400 MG/40ML SOLN injection      clindamycin (CLEOCIN IN D5W) 600 MG/50ML IVPB      clindamycin (CLEOCIN) 150 MG capsule      Clindamycin Phosphate (CLEOCIN PHOSPHATE) 600 MG/4ML SOLN           Today   CHIEF COMPLAINT:  Doing ok this am No pain   VITAL SIGNS:  Blood pressure (!) 152/78, pulse 79, temperature 98.7 F (37.1 C), temperature source Oral, resp. rate 14, height 5\' 5"  (1.651 m), weight 93 kg (205 lb), SpO2 97 %.   REVIEW OF SYSTEMS:  Review of Systems  Constitutional: Negative.  Negative for chills, fever and malaise/fatigue.  HENT: Negative.  Negative for ear discharge, ear pain, hearing loss, nosebleeds and sore throat.   Eyes: Negative.  Negative for blurred vision and pain.  Respiratory: Negative.  Negative for cough, hemoptysis, shortness of breath and wheezing.   Cardiovascular: Negative.  Negative for chest pain, palpitations and leg swelling.  Gastrointestinal: Negative.  Negative for abdominal pain, blood in stool, diarrhea, nausea and vomiting.  Genitourinary: Negative.  Negative for dysuria.  Musculoskeletal: Positive for joint pain. Negative for back pain.  Skin: Negative.   Neurological: Negative for dizziness, tremors, speech change, focal weakness, seizures and headaches.  Endo/Heme/Allergies: Negative.  Does not bruise/bleed easily.  Psychiatric/Behavioral: Negative.  Negative for depression, hallucinations and suicidal ideas.     PHYSICAL EXAMINATION:  GENERAL:  76 y.o.-year-old patient lying in the bed with no acute distress.  NECK:  Supple, no jugular venous distention. No thyroid enlargement, no tenderness.  LUNGS: Normal breath sounds bilaterally, no wheezing, rales,rhonchi  No use of accessory muscles of respiration.  CARDIOVASCULAR: S1, S2 normal. No murmurs, rubs, or gallops.  ABDOMEN: Soft, non-tender, non-distended. Bowel sounds  present. No organomegaly or mass.  EXTREMITIES: No pedal edema, cyanosis, or clubbing.  PSYCHIATRIC: The patient is alert and oriented x 3.  SKIN: foot wrapped  DATA REVIEW:   CBC  Recent Labs Lab 03/15/17 0414  WBC 6.8  HGB 11.2*  HCT 33.5*  PLT 245    Chemistries   Recent Labs Lab 03/13/17 0231 03/15/17 0414  NA 133* 136  K 4.5 3.8  CL 101 106  CO2 22 25  GLUCOSE 220* 143*  BUN 18 10  CREATININE 0.85 0.87  CALCIUM 9.4 8.5*  AST 27  --   ALT 15  --   ALKPHOS 56  --   BILITOT 1.5*  --     Cardiac Enzymes  Recent Labs Lab 03/13/17 0231 03/15/17 0414  TROPONINI 0.09* 0.04*    Microbiology Results  @[email or web address] @  RADIOLOGY:  Dg Foot Complete Right  Result Date: 03/13/2017 CLINICAL DATA:  Soft tissue ulceration of the right foot. EXAM: RIGHT FOOT COMPLETE - 3+ VIEW COMPARISON:  None. FINDINGS: And extensive erosion of the medial aspect of the head of the first metatarsal. There  is dislocation at the first metatarsal phalangeal joint. There is no other acute abnormality. Slight dorsal spurring in the midfoot. IMPRESSION: Findings consistent with osteomyelitis of the head of the first metatarsal. Dislocation at the first metatarsal phalangeal joint. Electronically Signed   By: Francene Boyers M.D.   On: 03/13/2017 16:25         Management plans discussed with the patient and sje is in agreement. Stable for discharge   Patient should follow up with dr Ether Griffins  CODE STATUS:     Code Status Orders        Start     Ordered   03/13/17 1352  Do not attempt resuscitation (DNR)  Continuous    Question Answer Comment  In the event of cardiac or respiratory ARREST Do not call a "code blue"   In the event of cardiac or respiratory ARREST Do not perform Intubation, CPR, defibrillation or ACLS   In the event of cardiac or respiratory ARREST Use medication by any route, position, wound care, and other measures to relive pain and suffering. May use oxygen,  suction and manual treatment of airway obstruction as needed for comfort.      03/13/17 1351    Code Status History    Date Active Date Inactive Code Status Order ID Comments User Context   03/13/2017  7:26 AM 03/13/2017  1:51 PM Full Code [id number]  [name] Natal, MD Inpatient      TOTAL TIME TAKING CARE OF THIS PATIENT: 37 minutes.    Note: This dictation was prepared with Dragon dictation along with smaller phrase technology. Any transcriptional errors that result from this process are unintentional.  Savera Donson M.D on 03/15/2017 at 8:51 AM  Between 7am to 6pm - Pager - [phone] After 6pm go to [email or web address] - Social research officer, government  [facility]  Office  [phone]  CC: Primary care physician; [name] Leech, DO

## 2017-03-15 NOTE — Progress Notes (Signed)
Report given to Seward GraterMaggie, RN at [facility]. EMS will transport patient.

## 2017-03-15 NOTE — Progress Notes (Signed)
Multiple family members at bedside. After discussion with patient, decision confirmed to continue with D/C to [facility]. Will notify SW.

## 2017-03-15 NOTE — Progress Notes (Signed)
[facility]. She will drop off belongings, etc at the facility.

## 2017-03-15 NOTE — Progress Notes (Signed)
[facility] [place] [facility]   PATIENT NAME: [name] [name]    MR#:  [id number]  DATE OF BIRTH:  04/11/1941  SUBJECTIVE:   No issues  REVIEW OF SYSTEMS:    Review of Systems  Constitutional: Negative for fever, chills weight loss HENT: Negative for ear pain, nosebleeds, congestion, facial swelling, rhinorrhea, neck pain, neck stiffness and ear discharge.   Respiratory: Negative for cough, shortness of breath, wheezing  Cardiovascular: Negative for chest pain, palpitations and leg swelling.  Gastrointestinal: Negative for heartburn, abdominal pain, vomiting, diarrhea or consitpation Genitourinary: Negative for dysuria, urgency, frequency, hematuria Musculoskeletal: Negative for back pain or joint pain Neurological: Negative for dizziness, seizures, syncope, focal weakness,  numbness and headaches.  Hematological: Does not bruise/bleed easily.  Psychiatric/Behavioral: Negative for hallucinations, confusion, dysphoric mood    Tolerating Diet:yes      DRUG ALLERGIES:  No Known Allergies  VITALS:  Blood pressure (!) 152/78, pulse 79, temperature 98.7 F (37.1 C), temperature source Oral, resp. rate 14, height 5\' 5"  (1.651 m), weight 93 kg (205 lb), SpO2 97 %.  PHYSICAL EXAMINATION:  Constitutional: Appears well-developed and well-nourished. No distress. HENT: Normocephalic. Marland Kitchen. Oropharynx is clear and moist.  Eyes: Conjunctivae and EOM are normal. PERRLA, no scleral icterus.  Neck: Normal ROM. Neck supple. No JVD. No tracheal deviation. CVS: RRR, S1/S2 +, no murmurs, no gallops, no carotid bruit.  Pulmonary: Effort and breath sounds normal, no stridor, rhonchi, wheezes, rales.  Abdominal: Soft. BS +,  no distension, tenderness, rebound or guarding.  Musculoskeletal: Normal range of motion. No edema and no tenderness.  Neuro: Alert. CN 2-12 grossly intact. No focal deficits. Skin:leg wrapped Psychiatric: Normal mood and affect.      LABORATORY  PANEL:   CBC  Recent Labs Lab 03/15/17 0414  WBC 6.8  HGB 11.2*  HCT 33.5*  PLT 245   ------------------------------------------------------------------------------------------------------------------  Chemistries   Recent Labs Lab 03/13/17 0231 03/15/17 0414  NA 133* 136  K 4.5 3.8  CL 101 106  CO2 22 25  GLUCOSE 220* 143*  BUN 18 10  CREATININE 0.85 0.87  CALCIUM 9.4 8.5*  AST 27  --   ALT 15  --   ALKPHOS 56  --   BILITOT 1.5*  --    ------------------------------------------------------------------------------------------------------------------  Cardiac Enzymes  Recent Labs Lab 03/13/17 0231 03/15/17 0414  TROPONINI 0.09* 0.04*   ------------------------------------------------------------------------------------------------------------------  RADIOLOGY:  Dg Foot Complete Right  Result Date: 03/13/2017 CLINICAL DATA:  Soft tissue ulceration of the right foot. EXAM: RIGHT FOOT COMPLETE - 3+ VIEW COMPARISON:  None. FINDINGS: And extensive erosion of the medial aspect of the head of the first metatarsal. There is dislocation at the first metatarsal phalangeal joint. There is no other acute abnormality. Slight dorsal spurring in the midfoot. IMPRESSION: Findings consistent with osteomyelitis of the head of the first metatarsal. Dislocation at the first metatarsal phalangeal joint. Electronically Signed   By: Francene BoyersJames  Maxwell M.D.   On: 03/13/2017 16:25     ASSESSMENT AND PLAN:   76 year old female with diabetes who is followed by wound care who presents with full-thickness diabetic foot ulceration with abscess infection of right great toe joint.  1. 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 Diabetes: Continue ADA diet and outpatient medications  3. Essential hypertension: Continue enalapril  4. Hypothyroid: Continue Synthroid 5. Glaucoma: Continue eyedrops 6. Elevated troponin due to demand ischemia.   D/w dr Ether Griffins and CSW   Management plans discussed with the patient and she is in agreement.  CODE STATUS: dnr  TOTAL TIME TAKING CARE OF THIS PATIENT: 26 minutes.     POSSIBLE D/C today, DEPENDING ON CLINICAL CONDITION.   Nishant Schrecengost M.D on 03/15/2017 at 8:57 AM  Between 7am to 6pm - Pager - [phone] After 6pm go to [email or web address] - password [email or web address]  [facility]  Office  [phone]  CC: Primary care physician; Idelle Leech, DO  Note: This dictation was prepared with Dragon dictation along with smaller phrase technology. Any transcriptional errors that result from this process are unintentional.

## 2017-03-15 NOTE — Clinical Social Work Note (Signed)
CSW was alerted by the facility that the patient has decided to discharge today to [facility]. CSW will deliver packet once room assignment given. Patient will discharge via non-emergent EMS. CSW will continue to follow pending additional discharge needs.  Argentina PonderKaren Martha Debbie Bellucci, MSW, Theresia MajorsLCSWA [phone]

## 2017-03-15 NOTE — Clinical Social Work Note (Addendum)
[facility] when patients are at SNF. The [facility] pharmacy is not open on Sundays. The facility needs to know by noon if the patient is discharging or not. The facility is willing to supply the initial dose of PO antibiotics if the family can bring all other needed regular medications from home. CSW will continue to follow.   Argentina PonderKaren Martha Mellody Masri, MSW, Theresia MajorsLCSWA [phone]

## 2017-03-18 LAB — AEROBIC/ANAEROBIC CULTURE (SURGICAL/DEEP WOUND)

## 2017-03-18 LAB — AEROBIC/ANAEROBIC CULTURE W GRAM STAIN (SURGICAL/DEEP WOUND): Gram Stain: NONE SEEN

## 2017-03-18 LAB — CULTURE, BLOOD (ROUTINE X 2)
CULTURE: NO GROWTH
Culture: NO GROWTH
Special Requests: ADEQUATE
Special Requests: ADEQUATE

## 2018-12-10 ENCOUNTER — Other Ambulatory Visit: Payer: Self-pay

## 2018-12-10 ENCOUNTER — Inpatient Hospital Stay
Admission: EM | Admit: 2018-12-10 | Discharge: 2018-12-14 | DRG: 690 | Disposition: A | Discharge status: Skilled Nursing Facility | Payer: Medicare (Managed Care) | Attending: Internal Medicine | Admitting: Internal Medicine

## 2018-12-10 DIAGNOSIS — E039 Hypothyroidism, unspecified: Secondary | ICD-10-CM | POA: Diagnosis present

## 2018-12-10 DIAGNOSIS — N390 Urinary tract infection, site not specified: Principal | ICD-10-CM | POA: Diagnosis present

## 2018-12-10 DIAGNOSIS — Y92009 Unspecified place in unspecified non-institutional (private) residence as the place of occurrence of the external cause: Secondary | ICD-10-CM

## 2018-12-10 DIAGNOSIS — Z23 Encounter for immunization: Secondary | ICD-10-CM

## 2018-12-10 DIAGNOSIS — Z79899 Other long term (current) drug therapy: Secondary | ICD-10-CM

## 2018-12-10 DIAGNOSIS — S32059A Unspecified fracture of fifth lumbar vertebra, initial encounter for closed fracture: Secondary | ICD-10-CM | POA: Diagnosis present

## 2018-12-10 DIAGNOSIS — M6282 Rhabdomyolysis: Secondary | ICD-10-CM | POA: Diagnosis present

## 2018-12-10 DIAGNOSIS — N3000 Acute cystitis without hematuria: Secondary | ICD-10-CM | POA: Diagnosis not present

## 2018-12-10 DIAGNOSIS — Z66 Do not resuscitate: Secondary | ICD-10-CM | POA: Diagnosis present

## 2018-12-10 DIAGNOSIS — W19XXXA Unspecified fall, initial encounter: Secondary | ICD-10-CM | POA: Diagnosis present

## 2018-12-10 DIAGNOSIS — R52 Pain, unspecified: Secondary | ICD-10-CM

## 2018-12-10 DIAGNOSIS — Z7984 Long term (current) use of oral hypoglycemic drugs: Secondary | ICD-10-CM

## 2018-12-10 DIAGNOSIS — Z833 Family history of diabetes mellitus: Secondary | ICD-10-CM

## 2018-12-10 DIAGNOSIS — I10 Essential (primary) hypertension: Secondary | ICD-10-CM | POA: Diagnosis present

## 2018-12-10 DIAGNOSIS — R296 Repeated falls: Secondary | ICD-10-CM | POA: Diagnosis present

## 2018-12-10 DIAGNOSIS — Z7982 Long term (current) use of aspirin: Secondary | ICD-10-CM

## 2018-12-10 DIAGNOSIS — I160 Hypertensive urgency: Secondary | ICD-10-CM | POA: Diagnosis present

## 2018-12-10 DIAGNOSIS — E119 Type 2 diabetes mellitus without complications: Secondary | ICD-10-CM | POA: Diagnosis present

## 2018-12-10 DIAGNOSIS — I2510 Atherosclerotic heart disease of native coronary artery without angina pectoris: Secondary | ICD-10-CM | POA: Diagnosis present

## 2018-12-10 DIAGNOSIS — Z79890 Hormone replacement therapy (postmenopausal): Secondary | ICD-10-CM | POA: Diagnosis not present

## 2018-12-10 DIAGNOSIS — R531 Weakness: Secondary | ICD-10-CM

## 2018-12-10 DIAGNOSIS — J45909 Unspecified asthma, uncomplicated: Secondary | ICD-10-CM | POA: Diagnosis present

## 2018-12-10 LAB — TSH: TSH: 4.848 u[IU]/mL — ABNORMAL HIGH (ref 0.350–4.500)

## 2018-12-10 LAB — URINALYSIS, COMPLETE (UACMP) WITH MICROSCOPIC
BILIRUBIN URINE: NEGATIVE
Glucose, UA: NEGATIVE mg/dL
KETONES UR: 20 mg/dL — AB
Nitrite: POSITIVE — AB
PH: 5 (ref 5.0–8.0)
Protein, ur: NEGATIVE mg/dL
SPECIFIC GRAVITY, URINE: 1.009 (ref 1.005–1.030)

## 2018-12-10 LAB — COMPREHENSIVE METABOLIC PANEL
ALT: 13 U/L (ref 0–44)
ANION GAP: 13 (ref 5–15)
AST: 18 U/L (ref 15–41)
Albumin: 4.1 g/dL (ref 3.5–5.0)
Alkaline Phosphatase: 67 U/L (ref 38–126)
BUN: 9 mg/dL (ref 8–23)
CHLORIDE: 100 mmol/L (ref 98–111)
CO2: 24 mmol/L (ref 22–32)
Calcium: 9.3 mg/dL (ref 8.9–10.3)
Creatinine, Ser: 0.66 mg/dL (ref 0.44–1.00)
Glucose, Bld: 133 mg/dL — ABNORMAL HIGH (ref 70–99)
Potassium: 3.8 mmol/L (ref 3.5–5.1)
SODIUM: 137 mmol/L (ref 135–145)
Total Bilirubin: 1.3 mg/dL — ABNORMAL HIGH (ref 0.3–1.2)
Total Protein: 7.7 g/dL (ref 6.5–8.1)

## 2018-12-10 LAB — CBC
HCT: 38.9 % (ref 36.0–46.0)
Hemoglobin: 12.4 g/dL (ref 12.0–15.0)
MCH: 26.8 pg (ref 26.0–34.0)
MCHC: 31.9 g/dL (ref 30.0–36.0)
MCV: 84.2 fL (ref 80.0–100.0)
PLATELETS: 276 10*3/uL (ref 150–400)
RBC: 4.62 MIL/uL (ref 3.87–5.11)
RDW: 14.2 % (ref 11.5–15.5)
WBC: 8.7 10*3/uL (ref 4.0–10.5)
nRBC: 0 % (ref 0.0–0.2)

## 2018-12-10 LAB — CK: Total CK: 377 U/L — ABNORMAL HIGH (ref 38–234)

## 2018-12-10 LAB — TROPONIN I

## 2018-12-10 LAB — GLUCOSE, CAPILLARY
Glucose-Capillary: 161 mg/dL — ABNORMAL HIGH (ref 70–99)
Glucose-Capillary: 110 mg/dL — ABNORMAL HIGH (ref 70–99)

## 2018-12-10 MED ORDER — VITAMIN D (ERGOCALCIFEROL) 1.25 MG (50000 UNIT) PO CAPS
50000.0000 [IU] | ORAL_CAPSULE | ORAL | Status: DC
  Filled 2018-12-10: qty 1

## 2018-12-10 MED ORDER — HYDRALAZINE HCL 20 MG/ML IJ SOLN: 10.0000 mg | Freq: Four times a day (QID) | INTRAMUSCULAR | Status: DC | PRN

## 2018-12-10 MED ORDER — INSULIN ASPART 100 UNIT/ML ~~LOC~~ SOLN
0.0000 [IU] | Freq: Three times a day (TID) | SUBCUTANEOUS | Status: DC
  Administered 2018-12-10: 1 [IU] via SUBCUTANEOUS
  Administered 2018-12-12: 2 [IU] via SUBCUTANEOUS
  Administered 2018-12-13: 1 [IU] via SUBCUTANEOUS
  Administered 2018-12-13: 2 [IU] via SUBCUTANEOUS
  Administered 2018-12-13: 3 [IU] via SUBCUTANEOUS
  Administered 2018-12-14: 1 [IU] via SUBCUTANEOUS
  Filled 2018-12-10 (×6): qty 1

## 2018-12-10 MED ORDER — SODIUM CHLORIDE 0.9 % IV SOLN
1.0000 g | INTRAVENOUS | Status: DC
  Administered 2018-12-11: 1 g via INTRAVENOUS
  Filled 2018-12-10: qty 1
  Filled 2018-12-10: qty 10

## 2018-12-10 MED ORDER — CEFTRIAXONE SODIUM 1 G IJ SOLR
1.0000 g | Freq: Once | INTRAMUSCULAR | Status: AC
  Administered 2018-12-10: 1 g via INTRAVENOUS
  Filled 2018-12-10: qty 10

## 2018-12-10 MED ORDER — PNEUMOCOCCAL VAC POLYVALENT 25 MCG/0.5ML IJ INJ
0.5000 mL | INJECTION | INTRAMUSCULAR | Status: AC
  Administered 2018-12-11: 0.5 mL via INTRAMUSCULAR
  Filled 2018-12-10: qty 0.5

## 2018-12-10 MED ORDER — ASPIRIN EC 81 MG PO TBEC
81.0000 mg | DELAYED_RELEASE_TABLET | Freq: Every day | ORAL | Status: DC
  Administered 2018-12-11 – 2018-12-14 (×4): 81 mg via ORAL
  Filled 2018-12-10 (×4): qty 1

## 2018-12-10 MED ORDER — LEVOTHYROXINE SODIUM 50 MCG PO TABS
75.0000 ug | ORAL_TABLET | Freq: Every day | ORAL | Status: DC
  Administered 2018-12-11 – 2018-12-14 (×4): 75 ug via ORAL
  Filled 2018-12-10 (×4): qty 2

## 2018-12-10 MED ORDER — TIMOLOL MALEATE 0.5 % OP SOLN
1.0000 [drp] | Freq: Two times a day (BID) | OPHTHALMIC | Status: DC
  Administered 2018-12-10 – 2018-12-14 (×8): 1 [drp] via OPHTHALMIC
  Filled 2018-12-10: qty 5

## 2018-12-10 MED ORDER — ENALAPRIL MALEATE 20 MG PO TABS
20.0000 mg | ORAL_TABLET | Freq: Every day | ORAL | Status: DC
  Administered 2018-12-11 – 2018-12-13 (×3): 20 mg via ORAL
  Filled 2018-12-10 (×4): qty 1

## 2018-12-10 MED ORDER — BRIMONIDINE TARTRATE 0.15 % OP SOLN
1.0000 [drp] | Freq: Three times a day (TID) | OPHTHALMIC | Status: DC
  Administered 2018-12-10 – 2018-12-14 (×12): 1 [drp] via OPHTHALMIC
  Filled 2018-12-10: qty 5

## 2018-12-10 MED ORDER — LATANOPROST 0.005 % OP SOLN
1.0000 [drp] | Freq: Every day | OPHTHALMIC | Status: DC
  Administered 2018-12-10 – 2018-12-13 (×4): 1 [drp] via OPHTHALMIC
  Filled 2018-12-10: qty 2.5

## 2018-12-10 MED ORDER — INSULIN ASPART 100 UNIT/ML ~~LOC~~ SOLN: 0.0000 [IU] | Freq: Every day | SUBCUTANEOUS | Status: DC

## 2018-12-10 MED ORDER — ACETAMINOPHEN 325 MG PO TABS: 650.0000 mg | ORAL_TABLET | Freq: Four times a day (QID) | ORAL | Status: DC | PRN

## 2018-12-10 MED ORDER — ENOXAPARIN SODIUM 40 MG/0.4ML ~~LOC~~ SOLN
40.0000 mg | SUBCUTANEOUS | Status: DC
  Administered 2018-12-11 – 2018-12-14 (×4): 40 mg via SUBCUTANEOUS
  Filled 2018-12-10 (×4): qty 0.4

## 2018-12-10 MED ORDER — SODIUM CHLORIDE 0.9 % IV SOLN
INTRAVENOUS | Status: DC
  Administered 2018-12-10 – 2018-12-11 (×2): via INTRAVENOUS

## 2018-12-10 NOTE — ED Notes (Signed)
Pt's sister Blair Hailey called per pt request to tell sister where she is, no answer at this time. Unable to leave voicemail.

## 2018-12-10 NOTE — ED Notes (Addendum)
ED TO INPATIENT HANDOFF REPORT  ED Nurse Name and Phone #:  Helmut Muster [phone]  S Name/Age/Gender [name] [name] 78 y.o. female Room/Bed: ED04A/ED04A  Code Status   Code Status: Full Code  Home/SNF/Other Home Patient oriented to: self, place, time and situation Is this baseline? Yes   Triage Complete: Triage complete  Chief Complaint Pain and Weakness  Triage Note Patient arrived to ER by [facility] from home. Patient lives by self and was sitting in chair in urine possible all day. Patient complaining of rt side pain.    Allergies No Known Allergies  Level of Care/Admitting Diagnosis ED Disposition    ED Disposition Condition Comment   Admit  Hospital Area: [facility] [[id number]]  Level of Care: Med-Surg [16]  Diagnosis: UTI (urinary tract infection) [891694]  Admitting Physician: Jama Flavors [[id number]]  Attending Physician: Jama Flavors [[id number]]  Estimated length of stay: past midnight tomorrow  Certification:: I certify this patient will need inpatient services for at least 2 midnights  PT Class (Do Not Modify): Inpatient [101]  PT Acc Code (Do Not Modify): Private [1]       B Medical/Surgery History Past Medical History:  Diagnosis Date  . Allergic rhinitis   . Asthma   . 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 Diverticulitis   . Hypertensive heart disease with heart failure (HCC)   . Pain in right shoulder   . Primary open-angle glaucoma, bilateral, stage unspecified   . Thyroid disease    hypothroidism, unspecified  . Venous insufficiency (chronic) (peripheral)   . Vitamin D deficiency    Past Surgical History:  Procedure Laterality Date  . none       A IV Location/Drains/Wounds Patient Lines/Drains/Airways Status   Active Line/Drains/Airways    Name:   Placement date:   Placement time:   Site:   Days:   Peripheral IV 03/13/17 Left Hand   03/13/17    0141    Hand   637    Peripheral IV 03/13/17 Right Hand   03/13/17    0217    Hand   637   Peripheral IV 12/10/18 Right Antecubital   12/10/18    0343    Antecubital   less than 1   Wound / Incision (Open or Dehisced) 03/13/17 Diabetic ulcer Foot Right;Mid;Posterior   03/13/17    0715    Foot   637   Wound / Incision (Open or Dehisced) 03/13/17 Diabetic ulcer Foot Right;Posterior;Mid   03/13/17    0715    Foot   637          Intake/Output Last 24 hours No intake or output data in the 24 hours ending 12/10/18 1031  Labs/Imaging Results for orders placed or performed during the hospital encounter of 12/10/18 (from the past 48 hour(s))  CBC     Status: None   Collection Time: 12/10/18  3:32 AM  Result Value Ref Range   WBC 8.7 4.0 - 10.5 K/uL   RBC 4.62 3.87 - 5.11 MIL/uL   Hemoglobin 12.4 12.0 - 15.0 g/dL   HCT 50.3 88.8 - 28.0 %   MCV 84.2 80.0 - 100.0 fL   MCH 26.8 26.0 - 34.0 pg   MCHC 31.9 30.0 - 36.0 g/dL   RDW 03.4 91.7 - 91.5 %   Platelets 276 150 - 400 K/uL   nRBC 0.0 0.0 - 0.2 %    Comment: Performed  at [facility], [address] [place], [place] [address]  Comprehensive metabolic panel     Status: Abnormal   Collection Time: 12/10/18  3:32 AM  Result Value Ref Range   Sodium 137 135 - 145 mmol/L   Potassium 3.8 3.5 - 5.1 mmol/L   Chloride 100 98 - 111 mmol/L   CO2 24 22 - 32 mmol/L   Glucose, Bld 133 (H) 70 - 99 mg/dL   BUN 9 8 - 23 mg/dL   Creatinine, Ser 1.22 0.44 - 1.00 mg/dL   Calcium 9.3 8.9 - 48.2 mg/dL   Total Protein 7.7 6.5 - 8.1 g/dL   Albumin 4.1 3.5 - 5.0 g/dL   AST 18 15 - 41 U/L   ALT 13 0 - 44 U/L   Alkaline Phosphatase 67 38 - 126 U/L   Total Bilirubin 1.3 (H) 0.3 - 1.2 mg/dL   GFR calc non Af Amer >60 >60 mL/min   GFR calc Af Amer >60 >60 mL/min   Anion gap 13 5 - 15    Comment: Performed at [facility], [address] [place], [place] [address]  Urinalysis, Complete w Microscopic     Status: Abnormal   Collection Time: 12/10/18  3:32  AM  Result Value Ref Range   Color, Urine YELLOW (A) YELLOW   APPearance CLOUDY (A) CLEAR   Specific Gravity, Urine 1.009 1.005 - 1.030   pH 5.0 5.0 - 8.0   Glucose, UA NEGATIVE NEGATIVE mg/dL   Hgb urine dipstick SMALL (A) NEGATIVE   Bilirubin Urine NEGATIVE NEGATIVE   Ketones, ur 20 (A) NEGATIVE mg/dL   Protein, ur NEGATIVE NEGATIVE mg/dL   Nitrite POSITIVE (A) NEGATIVE   Leukocytes,Ua MODERATE (A) NEGATIVE   RBC / HPF 0-5 0 - 5 RBC/hpf   WBC, UA 11-20 0 - 5 WBC/hpf   Bacteria, UA FEW (A) NONE SEEN   Squamous Epithelial / LPF 0-5 0 - 5   WBC Clumps PRESENT    Mucus PRESENT     Comment: Performed at [facility], [address] [place], [place] [address]  CK     Status: Abnormal   Collection Time: 12/10/18  3:32 AM  Result Value Ref Range   Total CK 377 (H) 38 - 234 U/L    Comment: Performed at [facility], [address] [place], [place] [address]  Troponin I - ONCE - STAT     Status: None   Collection Time: 12/10/18  3:32 AM  Result Value Ref Range   Troponin I <0.03 <0.03 ng/mL    Comment: Performed at [facility], [address] [place], [place] [address]   No results found.  Pending Labs Unresulted Labs (From admission, onward)    Start     Ordered   12/11/18 0500  Basic metabolic panel  Tomorrow morning,   STAT     12/10/18 0935   12/11/18 0500  CBC  Tomorrow morning,   STAT     12/10/18 0935   12/11/18 0500  Magnesium  Tomorrow morning,   STAT     12/10/18 0935   12/11/18 0500  CK  Tomorrow morning,   STAT     12/10/18 0935   12/11/18 0500  Hemoglobin A1c  Tomorrow morning,   STAT    Comments:  To assess prior glycemic control    12/10/18 0935   12/10/18 0935  TSH  Once,   STAT     12/10/18 0935  Vitals/Pain Today's Vitals   12/10/18 0330 12/10/18 0330 12/10/18 0400 12/10/18 0430  BP: (!) 150/82 (!) 150/82 (!) 163/93 (!) 175/90  Pulse: 78 80 83 84  Resp: Temp:  98 F (36.7 C)    TempSrc:   Oral    SpO2: 100% 100% 100% 100%  Weight:      Height:      PainSc:        Isolation Precautions No active isolations  Medications Medications  acetaminophen (TYLENOL) tablet 650 mg (has no administration in time range)  aspirin EC tablet 81 mg (has no administration in time range)  enalapril (VASOTEC) tablet 20 mg (has no administration in time range)  levothyroxine (SYNTHROID, LEVOTHROID) tablet 75 mcg (has no administration in time range)  Vitamin D (Ergocalciferol) (DRISDOL) capsule 50,000 Units (has no administration in time range)  latanoprost (XALATAN) 0.005 % ophthalmic solution 1 drop (has no administration in time range)  brimonidine (ALPHAGAN) 0.15 % ophthalmic solution 1 drop (has no administration in time range)  timolol (BETIMOL) 0.5 % ophthalmic solution 1 drop (has no administration in time range)  enoxaparin (LOVENOX) injection 40 mg (has no administration in time range)  0.9 %  sodium chloride infusion (has no administration in time range)  cefTRIAXone (ROCEPHIN) 1 g in sodium chloride 0.9 % 100 mL IVPB (has no administration in time range)  insulin aspart (novoLOG) injection 0-9 Units (has no administration in time range)  insulin aspart (novoLOG) injection 0-5 Units (has no administration in time range)  cefTRIAXone (ROCEPHIN) 1 g in sodium chloride 0.9 % 100 mL IVPB (0 g Intravenous Stopped 12/10/18 0430)    Mobility walks with person assist High fall risk   Focused Assessments Cardiac Assessment Handoff:  Cardiac Rhythm: Normal sinus rhythm Lab Results  Component Value Date   CKTOTAL 377 (H) 12/10/2018   TROPONINI <0.03 12/10/2018   No results found for: DDIMER Does the Patient currently have chest pain? No  , Pulmonary Assessment Handoff:  Lung sounds:  clear bil O2 Device: Room Air   Generalized weakness noted to BUE and BLE.    R Recommendations: See Admitting Provider Note  Report given to: Raynelle Fanning  Additional Notes:

## 2018-12-10 NOTE — ED Triage Notes (Signed)
Patient arrived to ER by  EMS from home. Patient lives by self and was sitting in chair in urine possible all day. Patient complaining of rt side pain.

## 2018-12-10 NOTE — ED Notes (Addendum)
Patient had urine and BM saturated in adult incontinent brief. Patient states she has been sitting in chair all day and was unable to get up. Patient states she did eat dinner because [employer] brought her dinner. Patient states when she is able to walk she uses a walker.

## 2018-12-10 NOTE — ED Provider Notes (Addendum)
[facility] Emergency Department Provider Note    First MD Initiated Contact with Patient 12/10/18 [phone]     (approximate)  I have reviewed the triage vital signs and the nursing notes.   HISTORY  Chief Complaint Weakness   HPI [name] [name] is a 78 y.o. female with below list of previous medical conditions sent to the emergency department via EMS from home.  Patient states that she was unable to stand from her recliner today and as such patient covered in fecal material in urine.  Patient midst of generalized weakness.  Patient denies any known fever.  Patient denies any cough.  Patient denies any known sick contact.  Patient denies any abdominal pain no nausea vomiting or diarrhea.       Past Medical History:  Diagnosis Date  . Allergic rhinitis   . Asthma   . 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 Diverticulitis   . Hypertensive heart disease with heart failure (HCC)   . Pain in right shoulder   . Primary open-angle glaucoma, bilateral, stage unspecified   . Thyroid disease    hypothroidism, unspecified  . Venous insufficiency (chronic) (peripheral)   . Vitamin D deficiency     Patient Active Problem List   Diagnosis Date Noted  . Osteomyelitis (HCC) 03/13/2017  . Diabetic ulcer of toe of right foot associated with type 2 diabetes mellitus, limited to breakdown of skin (HCC)   . DNR (do not resuscitate)   . Goals of care, counseling/discussion   . Palliative care encounter   . Diabetic foot ulcer with osteomyelitis (HCC) 07/06/2015    Past Surgical History:  Procedure Laterality Date  . none      Prior to Admission medications   Medication Sig Start Date End Date Taking? Authorizing Provider  acetaminophen (TYLENOL) 325 MG tablet Take 650 mg by mouth every 6 (six) hours as needed for mild pain.    [provider]  aspirin EC 81 MG tablet Take 81 mg by mouth  daily.    [provider]  bimatoprost (LUMIGAN) 0.03 % ophthalmic solution Place 1 drop into the left eye at bedtime.    [provider]  brimonidine (ALPHAGAN) 0.15 % ophthalmic solution Place 1 drop into the left eye 3 (three) times daily.    [provider]  enalapril (VASOTEC) 20 MG tablet Take 20 mg by mouth daily.    [provider]  ergocalciferol (DRISDOL) 50000 UNITS capsule Take 50,000 Units by mouth once a week.    [provider]  levothyroxine (SYNTHROID, LEVOTHROID) 75 MCG tablet Take 75 mcg by mouth daily before breakfast.    [provider]  loperamide (IMODIUM A-D) 2 MG tablet Take 2 mg by mouth 4 (four) times daily as needed for diarrhea or loose stools.    [provider]  metFORMIN (GLUCOPHAGE) 850 MG tablet Take 850 mg by mouth 2 (two) times daily with a meal.    [provider]  timolol (BETIMOL) 0.5 % ophthalmic solution Place 1 drop into both eyes 2 (two) times daily.    [provider]    Allergies Patient has no known allergies.  Family History  Problem Relation Age of Onset  . Diabetes Mellitus II Mother     Social History Social History   Tobacco Use  . Smoking status: Never Smoker  . Smokeless tobacco: Never Used  Substance Use Topics  .  Alcohol use: No  . Drug use: No    Review of Systems Constitutional: No fever/chills Eyes: No visual changes. ENT: No sore throat. Cardiovascular: Denies chest pain. Respiratory: Denies shortness of breath. Gastrointestinal: No abdominal pain.  No nausea, no vomiting.  No diarrhea.  No constipation. Genitourinary: Negative for dysuria. Musculoskeletal: Negative for neck pain.  Negative for back pain. Integumentary: Negative for rash. Neurological: Negative for headaches, positive for generalized weakness   ____________________________________________   PHYSICAL EXAM:  VITAL SIGNS: ED Triage Vitals  Enc Vitals Group     BP  12/10/18 0330 (!) 150/82     Pulse Rate 12/10/18 0330 78     Resp 12/10/18 0330 11     Temp 12/10/18 0330 98 F (36.7 C)     Temp Source 12/10/18 0330 Oral     SpO2 12/10/18 0323 97 %     Weight 12/10/18 0326 88.5 kg (195 lb)     Height 12/10/18 0326 1.803 m ( )     Head Circumference --      Peak Flow --      Pain Score 12/10/18 0325 9     Pain Loc --      Pain Edu? --      Excl. in GC? --     Constitutional: Alert and oriented.  Ill-appearing  eyes: Conjunctivae are normal. PERRL. EOMI. Mouth/Throat: Mucous membranes are moist Oropharynx non-erythematous. Neck: No stridor.   Cardiovascular: Normal rate, regular rhythm. Good peripheral circulation. Grossly normal heart sounds. Respiratory: Normal respiratory effort.  No retractions. Lungs CTAB. Gastrointestinal: Soft and nontender. No distention.  Musculoskeletal: No lower extremity tenderness nor edema. No gross deformities of extremities. Neurologic:  Normal speech and language. No gross focal neurologic deficits are appreciated.  Skin:  Skin is warm, dry and intact. No rash noted. Psychiatric: Mood and affect are normal. Speech and behavior are normal.  ____________________________________________   LABS (all labs ordered are listed, but only abnormal results are displayed)  Labs Reviewed  COMPREHENSIVE METABOLIC PANEL - Abnormal; Notable for the following components:      Result Value   Glucose, Bld 133 (*)    Total Bilirubin 1.3 (*)    All other components within normal limits  URINALYSIS, COMPLETE (UACMP) WITH MICROSCOPIC - Abnormal; Notable for the following components:   Color, Urine YELLOW (*)    APPearance CLOUDY (*)    Hgb urine dipstick SMALL (*)    Ketones, ur 20 (*)    Nitrite POSITIVE (*)    Leukocytes,Ua MODERATE (*)    Bacteria, UA FEW (*)    All other components within normal limits  CK - Abnormal; Notable for the following components:   Total CK 377 (*)    All other components within normal  limits  CBC  TROPONIN I   ____________________________________________  EKG  ED ECG REPORT I, [place] N , the attending physician, personally viewed and interpreted this ECG.   Date: 12/10/2018  EKG Time: 3:30 AM  Rate: 78  Rhythm: Normal sinus rhythm  Axis: Normal  Intervals: Normal  ST&T Change: None    Procedures   ____________________________________________   INITIAL IMPRESSION / MDM / ASSESSMENT AND PLAN / ED COURSE  As part of my medical decision making, I reviewed the following data within the electronic MEDICAL RECORD NUMBER   78 year old female presenting with generalized weakness.  Patient covered in urine and fecal matter.  Patient was cleaned laboratory data revealed evidence of a urinary tract infection.  Blood  work baseline in comparison to previous studies with the exception of an elevated CK of 377.  Patient given IV ceftriaxone 1 g in the emergency department and discussed with Dr. Arville Care for hospital admission further evaluation and management.  ____________________________________________  FINAL CLINICAL IMPRESSION(S) / ED DIAGNOSES  Final diagnoses:  Acute cystitis without hematuria  Generalized weakness     MEDICATIONS GIVEN DURING THIS VISIT:  Medications  cefTRIAXone (ROCEPHIN) 1 g in sodium chloride 0.9 % 100 mL IVPB (0 g Intravenous Stopped 12/10/18 0430)     ED Discharge Orders    None       Note:  This document was prepared using Dragon voice recognition software and may include unintentional dictation errors.   Darci Current, MD 12/10/18 0677    Darci Current, MD 12/10/18 [phone]

## 2018-12-10 NOTE — H&P (Signed)
[employer] [facility] [place] [facility]   PATIENT NAME: [name] [name]    MR#:  [id number]  DATE OF BIRTH:  02/27/1941  DATE OF ADMISSION:  12/10/2018  PRIMARY CARE PHYSICIAN: Idelle Leech, DO   REQUESTING/REFERRING PHYSICIAN: Bayard Males  CHIEF COMPLAINT:   Chief Complaint  Patient presents with  . Weakness    HISTORY OF PRESENT ILLNESS:  [name] [name]  is a 78 y.o. female with a known history of hypertension, diabetes mellitus and asthma who was brought into the emergency room via EMS with complaints of generalized weakness.  Patient apparently was unable to stand from the recliner chair and was said to have been started with feces and urine.  Patient denied any fevers.  No cough.  No shortness of breath.  No nausea vomiting.  No sick contacts.  No abdominal pains.  Patient lives alone at home.  Patient was evaluated emergency room and found to have evidence of mild rhabdomyolysis and urinary tract infection.  Started on IV Rocephin and IV fluids.  Medical service called to admit patient for further evaluation and management.  PAST MEDICAL HISTORY:   Past Medical History:  Diagnosis Date  . Allergic rhinitis   . Asthma   . 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 Diverticulitis   . Hypertensive heart disease with heart failure (HCC)   . Pain in right shoulder   . Primary open-angle glaucoma, bilateral, stage unspecified   . Thyroid disease    hypothroidism, unspecified  . Venous insufficiency (chronic) (peripheral)   . Vitamin D deficiency     PAST SURGICAL HISTORY:   Past Surgical History:  Procedure Laterality Date  . none      SOCIAL HISTORY:   Social History   Tobacco Use  . Smoking status: Never Smoker  . Smokeless tobacco: Never Used  Substance Use Topics  . Alcohol use: No    FAMILY HISTORY:   Family History  Problem Relation Age of Onset  . Diabetes Mellitus  II Mother     DRUG ALLERGIES:   Allergies  Allergen Reactions  . Statins Other (See Comments)    Other reaction(s): UNKNOWN    REVIEW OF SYSTEMS:   Review of Systems  Constitutional: Negative for chills and fever.  HENT: Negative for hearing loss and tinnitus.   Eyes: Negative for blurred vision and double vision.  Respiratory: Negative for cough and hemoptysis.   Cardiovascular: Negative for chest pain.  Gastrointestinal: Negative for abdominal pain, heartburn, nausea and vomiting.  Genitourinary: Negative for dysuria and urgency.  Musculoskeletal: Negative for myalgias and neck pain.       Generalized weakness  Skin: Negative for itching and rash.  Neurological: Negative for dizziness and headaches.  Psychiatric/Behavioral: Negative for depression and hallucinations.    MEDICATIONS AT HOME:   Prior to Admission medications   Medication Sig Start Date End Date Taking? Authorizing Provider  acetaminophen (TYLENOL) 325 MG tablet Take 650 mg by mouth every 6 (six) hours as needed for mild pain.   Yes [provider]  albuterol (PROVENTIL HFA;VENTOLIN HFA) 108 (90 Base) MCG/ACT inhaler Inhale 2 puffs into the lungs every 4 (four) hours as needed for wheezing or shortness of breath.   Yes [provider]  aspirin EC 81 MG tablet Take 81 mg by mouth daily.   Yes [provider]  bimatoprost (LUMIGAN) 0.03 % ophthalmic solution Place 1 drop into both eyes  at bedtime.    Yes [provider]  brimonidine (ALPHAGAN) 0.15 % ophthalmic solution Place 1 drop into both eyes 3 (three) times daily.    Yes [provider]  enalapril (VASOTEC) 20 MG tablet Take 20 mg by mouth daily.   Yes [provider]  hydrocortisone (PROCTO-MED HC) 2.5 % rectal cream Place 1 application rectally every 4 (four) hours as needed for hemorrhoids or anal itching.   Yes [provider]  levothyroxine (SYNTHROID, LEVOTHROID) 75 MCG tablet Take 75 mcg  by mouth daily before breakfast.   Yes [provider]  loperamide (IMODIUM) 2 MG capsule Take 2 mg by mouth as needed for diarrhea or loose stools (max 8 capsules daily).    Yes [provider]  metFORMIN (GLUCOPHAGE-XR) 500 MG 24 hr tablet Take 500 mg by mouth daily.   Yes [provider]  timolol (BETIMOL) 0.5 % ophthalmic solution Place 1 drop into both eyes 2 (two) times daily.   Yes [provider]      VITAL SIGNS:  Blood pressure (!) 154/67, pulse 89, temperature 98 F (36.7 C), temperature source Oral, resp. rate (!) 9, height 5\' 11"  (1.803 m), weight 88.5 kg, SpO2 100 %.  PHYSICAL EXAMINATION:  Physical Exam  GENERAL:  78 y.o.-year-old patient lying in the bed with no acute distress.  EYES: Pupils equal, round, reactive to light and accommodation. No scleral icterus. Extraocular muscles intact.  HEENT: Head atraumatic, normocephalic. Oropharynx and nasopharynx clear.  NECK:  Supple, no jugular venous distention. No thyroid enlargement, no tenderness.  LUNGS: Normal breath sounds bilaterally, no wheezing, rales,rhonchi or crepitation. No use of accessory muscles of respiration.  CARDIOVASCULAR: S1, S2 normal. No murmurs, rubs, or gallops.  ABDOMEN: Soft, nontender, nondistended. Bowel sounds present. No organomegaly or mass.  EXTREMITIES: Evidence of venous stasis changes on both lower extremity. NEUROLOGIC: Cranial nerves II through XII are intact.  Generalized weakness with strength of 3-4 over 5. Sensation intact. Gait not checked.  PSYCHIATRIC: The patient is alert and oriented x 3.  SKIN: No obvious rash, lesion, or ulcer.   LABORATORY PANEL:   CBC Recent Labs  Lab 12/10/18 0332  WBC 8.7  HGB 12.4  HCT 38.9  PLT 276   ------------------------------------------------------------------------------------------------------------------  Chemistries  Recent Labs  Lab 12/10/18 0332  NA 137  K 3.8  CL 100  CO2 24  GLUCOSE 133*   BUN 9  CREATININE 0.66  CALCIUM 9.3  AST 18  ALT 13  ALKPHOS 67  BILITOT 1.3*   ------------------------------------------------------------------------------------------------------------------  Cardiac Enzymes Recent Labs  Lab 12/10/18 0332  TROPONINI <0.03   ------------------------------------------------------------------------------------------------------------------  RADIOLOGY:  No results found.    IMPRESSION AND PLAN:  Patient is a 78 year old female admitted with urinary tract infection and mild rhabdomyolysis.  1.  Urinary tract infection Patient started on IV Rocephin.  Clinically does not appear septic. Follow-up on urine culture and sensitivities with plans to transition to p.o. antibiotics on discharge.  2.  Mild rhabdomyolysis Secondary to patient sitting on the chair for prolonged period of time.  Denied any fall. Continue IV fluid hydration.  Follow-up on CPK level in a.m.  3.  Generalized weakness Physical therapy to evaluate and treat  4.  Hypothyroidism Continue Synthroid.  Check TSH level in a.m.  5.  Hypertensive urgency Blood pressure better controlled.  Resume home dose of enalapril.  Placed on PRN IV hydralazine with parameters  6.  Diabetes mellitus type 2 Placed Metformin on hold.  Sliding scale insulin coverage.  Glycosylated hemoglobin level in a.m.  DVT prophylaxis; Lovenox  Patient is involved with the [facility] program.  Likely to resume on discharge   All the records are reviewed and case discussed with ED provider. Management plans discussed with the patient, family and they are in agreement.  CODE STATUS: Full code  TOTAL TIME TAKING CARE OF THIS PATIENT: 48 minutes.    Hadrian Yarbrough M.D on 12/10/2018 at 1:36 PM  Between 7am to 6pm - Pager - [phone]  After 6pm go to [email or web address] - Social research officer, government  [facility] [place] [facility]  Office  [phone]  CC: Primary care physician; Idelle Leech, DO    Note: This dictation was prepared with Dragon dictation along with smaller phrase technology. Any transcriptional errors that result from this process are unintentional.

## 2018-12-10 NOTE — Progress Notes (Signed)
PT Cancellation Note  Patient Details Name: [name] [name] MRN: [id number] DOB: 02-May-1941   Cancelled Treatment:    Reason Eval/Treat Not Completed: Medical issues which prohibited therapy. Patient was having severe pain in right hip and was not able to participate in evaluation. Nsg was consulted about patients pain.   [address] [place], [place] DPT 12/10/2018, 1:52 PM

## 2018-12-11 ENCOUNTER — Inpatient Hospital Stay: Payer: Medicare (Managed Care)

## 2018-12-11 LAB — CBC
HCT: 36.8 % (ref 36.0–46.0)
Hemoglobin: 11.5 g/dL — ABNORMAL LOW (ref 12.0–15.0)
MCH: 26.4 pg (ref 26.0–34.0)
MCHC: 31.3 g/dL (ref 30.0–36.0)
MCV: 84.4 fL (ref 80.0–100.0)
Platelets: 247 10*3/uL (ref 150–400)
RBC: 4.36 MIL/uL (ref 3.87–5.11)
RDW: 14.4 % (ref 11.5–15.5)
WBC: 7.5 10*3/uL (ref 4.0–10.5)
nRBC: 0 % (ref 0.0–0.2)

## 2018-12-11 LAB — BASIC METABOLIC PANEL
Anion gap: 7 (ref 5–15)
BUN: 11 mg/dL (ref 8–23)
CO2: 22 mmol/L (ref 22–32)
CREATININE: 0.61 mg/dL (ref 0.44–1.00)
Calcium: 8.3 mg/dL — ABNORMAL LOW (ref 8.9–10.3)
Chloride: 107 mmol/L (ref 98–111)
GFR calc Af Amer: >60 mL/min (ref >60)
GFR calc non Af Amer: >60 mL/min (ref >60)
Glucose, Bld: 157 mg/dL — ABNORMAL HIGH (ref 70–99)
Potassium: 3.6 mmol/L (ref 3.5–5.1)
Sodium: 136 mmol/L (ref 135–145)

## 2018-12-11 LAB — MAGNESIUM: Magnesium: 1.9 mg/dL (ref 1.7–2.4)

## 2018-12-11 LAB — HEMOGLOBIN A1C
HEMOGLOBIN A1C: 7.5 % — AB (ref 4.8–5.6)
Mean Plasma Glucose: 168.55 mg/dL

## 2018-12-11 LAB — GLUCOSE, CAPILLARY
Glucose-Capillary: 149 mg/dL — ABNORMAL HIGH (ref 70–99)
Glucose-Capillary: 141 mg/dL — ABNORMAL HIGH (ref 70–99)
Glucose-Capillary: 167 mg/dL — ABNORMAL HIGH (ref 70–99)
Glucose-Capillary: 184 mg/dL — ABNORMAL HIGH (ref 70–99)

## 2018-12-11 LAB — CK: Total CK: 271 U/L — ABNORMAL HIGH (ref 38–234)

## 2018-12-11 LAB — TSH: TSH: 4.314 u[IU]/mL (ref 0.350–4.500)

## 2018-12-11 MED ORDER — SODIUM CHLORIDE 0.9 % IV SOLN
1.0000 g | INTRAVENOUS | Status: DC
  Administered 2018-12-12 – 2018-12-13 (×2): 1 g via INTRAVENOUS
  Filled 2018-12-11 (×2): qty 10
  Filled 2018-12-11: qty 1

## 2018-12-11 NOTE — Progress Notes (Signed)
[facility] [facility] [place] at [facility]   PATIENT NAME: [name] [name]    MR#:  [id number]  DATE OF BIRTH:  [birth year]  SUBJECTIVE:  CHIEF COMPLAINT:   Chief Complaint  Patient presents with  . Weakness   Lives alone, has been falling down and weak lately and noted to be having UTI. Complains of weakness.  Had some pain in the right hip with physical therapy yesterday.  REVIEW OF SYSTEMS:  CONSTITUTIONAL: No fever,have fatigue or weakness.  EYES: No blurred or double vision.  EARS, NOSE, AND THROAT: No tinnitus or ear pain.  RESPIRATORY: No cough, shortness of breath, wheezing or hemoptysis.  CARDIOVASCULAR: No chest pain, orthopnea, edema.  GASTROINTESTINAL: No nausea, vomiting, diarrhea or abdominal pain.  GENITOURINARY: No dysuria, hematuria.  ENDOCRINE: No polyuria, nocturia,  HEMATOLOGY: No anemia, easy bruising or bleeding SKIN: No rash or lesion. MUSCULOSKELETAL: No joint pain or arthritis.   NEUROLOGIC: No tingling, numbness, weakness.  PSYCHIATRY: No anxiety or depression.   ROS  DRUG ALLERGIES:   Allergies  Allergen Reactions  . Statins Other (See Comments)    Other reaction(s): UNKNOWN    VITALS:  Blood pressure (!) 148/72, pulse 73, temperature 98.5 F (36.9 C), temperature source Oral, resp. rate 19, height 5\' 11"  (1.803 m), weight 88.5 kg, SpO2 98 %.  PHYSICAL EXAMINATION:  GENERAL:  78 y.o.-year-old patient lying in the bed with no acute distress.  EYES: Pupils equal, round, reactive to light and accommodation. No scleral icterus. Extraocular muscles intact.  HEENT: Head atraumatic, normocephalic. Oropharynx and nasopharynx clear.  NECK:  Supple, no jugular venous distention. No thyroid enlargement, no tenderness.  LUNGS: Normal breath sounds bilaterally, no wheezing, rales,rhonchi or crepitation. No use of accessory muscles of respiration.  CARDIOVASCULAR: S1, S2 normal. No murmurs, rubs, or gallops.  ABDOMEN: Soft, nontender,  nondistended. Bowel sounds present. No organomegaly or mass.  EXTREMITIES: No pedal edema, cyanosis, or clubbing.  NEUROLOGIC: Cranial nerves II through XII are intact. Muscle strength 3-4/5 in all extremities. Sensation intact. Gait not checked.  PSYCHIATRIC: The patient is alert and oriented x 3.  SKIN: No obvious rash, lesion, or ulcer.   Physical Exam LABORATORY PANEL:   CBC Recent Labs  Lab 12/11/18 0602  WBC 7.5  HGB 11.5*  HCT 36.8  PLT 247   ------------------------------------------------------------------------------------------------------------------  Chemistries  Recent Labs  Lab 12/10/18 0332 12/11/18 0602  NA 137 136  K 3.8 3.6  CL 100 107  CO2 24 22  GLUCOSE 133* 157*  BUN 9 11  CREATININE 0.66 0.61  CALCIUM 9.3 8.3*  MG  --  1.9  AST 18  --   ALT 13  --   ALKPHOS 67  --   BILITOT 1.3*  --    ------------------------------------------------------------------------------------------------------------------  Cardiac Enzymes Recent Labs  Lab 12/10/18 0332  TROPONINI <0.03   ------------------------------------------------------------------------------------------------------------------  RADIOLOGY:  No results found.  ASSESSMENT AND PLAN:   Active Problems:   UTI (urinary tract infection)  Patient is a 78 year old female admitted with urinary tract infection and mild rhabdomyolysis.  1.  Urinary tract infection Patient started on IV Rocephin.  Clinically does not appear septic. Follow-up on urine culture and sensitivities with plans to transition to p.o. antibiotics on discharge.  2.  Mild rhabdomyolysis Secondary to patient sitting on the chair for prolonged period of time.  Denied any fall. Continue IV fluid hydration.  Follow-up on CPK level in a.m. Improved.  3.  Generalized weakness Physical therapy to evaluate and treat  4.  Hypothyroidism Continue Synthroid.  Checked TSH - acceptable.  5.  Hypertensive urgency Blood  pressure better controlled.  Resume home dose of enalapril.  Placed on PRN IV hydralazine with parameters  6.  Diabetes mellitus type 2 Placed Metformin on hold.  Sliding scale insulin coverage.  Glycosylated hemoglobin level in a.m.  7. Right hip pain- get Xray to r/o injuries.  DVT prophylaxis; Lovenox   All the records are reviewed and case discussed with Care Management/Social Workerr. Management plans discussed with the patient, family and they are in agreement.  CODE STATUS: Full.  TOTAL TIME TAKING CARE OF THIS PATIENT: 40 minutes.     POSSIBLE D/C IN 1-2 DAYS, DEPENDING ON CLINICAL CONDITION.   Altamese Dilling M.D on 12/11/2018   Between 7am to 6pm - Pager - [phone]  After 6pm go to [email or web address] - password [email or web address]  [facility] [place] [facility]  Office  [phone]  CC: Primary care physician; Idelle Leech, DO  Note: This dictation was prepared with Dragon dictation along with smaller phrase technology. Any transcriptional errors that result from this process are unintentional.

## 2018-12-11 NOTE — Evaluation (Signed)
Physical Therapy Evaluation Patient Details Name: [name] [name] MRN: [id number] DOB: [birth year] Today's Date: 12/11/2018   History of Present Illness  Pt is a 78 y.o. female presenting to hospital after being unable to stand from recliner--pt covered in fecal material and urine.  Pt admitted with UTI, mild rhabdomyolysis, generalized weakness, and hypertensive urgency.  PMH includes DM, R shoulder pain, asthma, osteomyelitis 03/13/17, and diabetic ulcer R toe.  Clinical Impression  Prior to hospital admission, pt reports being ambulatory with 4[facility].  Pt lives alone in 1 level apt (no steps); has home health aid M/T/R/Sat; [facility].  Pt reporting significant R hip pain with attempted R DF ROM, L hip flexion ROM, and with attempted R hip flexion or abduction ROM.  Pt initially willing to attempt OOB mobility but once attempting pt refusing to continue attempting d/t reports of significant R hip pain.  R hip tender to touch (and also R IT band tender to touch).  Pt denies any recent falls and pt reports she first felt pain when attempting to stand from recliner at home (which is why she couldn't stand).  Pt would benefit from skilled PT to address noted impairments and functional limitations (see below for any additional details). Nurse notified of pt's continued reports of R hip pain which is limiting mobility attempts at this time.    Follow Up Recommendations SNF    Equipment Recommendations  (TBD at next facility)    Recommendations for Other Services       Precautions / Restrictions Precautions Precautions: Fall Restrictions Weight Bearing Restrictions: No      Mobility  Bed Mobility Overal bed mobility: Needs Assistance Bed Mobility: Rolling Rolling: +2 for physical assistance         General bed mobility comments: attempted logrolling pt in bed with 1 assist (pt initially willing) but when attempting pt c/o too much R hip pain and refused to continue  attempting  Transfers                    Ambulation/Gait                Stairs            Wheelchair Mobility    Modified Rankin (Stroke Patients Only)       Balance                                             Pertinent Vitals/Pain Pain Assessment: 0-10 Pain Score: 8  Pain Location: R hip Pain Descriptors / Indicators: Aching;Tender;Guarding Pain Intervention(s): Limited activity within patient's tolerance;Monitored during session;Repositioned;Other (comment)(RN notified)    Home Living Family/patient expects to be discharged to:: Private residence Living Arrangements: Alone Available Help at Discharge: ([facility]) Type of Home: Apartment Home Access: Level entry     Home Layout: One level Home Equipment: Emergency planning/management officer - 4 wheels;Grab bars - tub/shower;Grab bars - toilet      Prior Function Level of Independence: Needs assistance   Gait / Transfers Assistance Needed: Ambulates with 4ww  ADL's / Homemaking Assistance Needed: Home health aid M/T/R/Sat to assist with cleaning and ADL's.  Receives MOW.  Comments: Was [facility] to [facility] on Fr[facility] (not anymore d/t current [facility] restrictions from COVID 19)     Hand Dominance        Extremity/Trunk Assessment   Upper Extremity Assessment  Upper Extremity Assessment: Generalized weakness(Good B hand grip strength; at least 3/5 AROM B shoulder flexion and elbow flexion/extension)    Lower Extremity Assessment Lower Extremity Assessment: RLE deficits/detail;LLE deficits/detail RLE Deficits / Details: pt reporting significant R hip pain with minimal R DF AROM (unable to get to neutral DF PROM d/t R hip pain); pt refused to allow therapist to move R LE d/t R hip pain with attempted movement (into hip abduction or hip flexion) RLE: Unable to fully assess due to pain LLE Deficits / Details: DF at least 3/5 AROM (DF to neutral PROM); L hip flexion at least 30 degrees PROM (pt refused to  allow therapist to move L hip into flexion d/t causing significant R hip pain) LLE: Unable to fully assess due to pain       Communication   Communication: No difficulties  Cognition Arousal/Alertness: Awake/alert Behavior During Therapy: WFL for tasks assessed/performed Overall Cognitive Status: No family/caregiver present to determine baseline cognitive functioning                                 General Comments: Oriented to person, place, and situation.      General Comments   Nursing cleared pt for participation in physical therapy.  Pt agreeable to PT session.    Exercises Total Joint Exercises Hip ABduction/ADduction: AAROM;Strengthening;Left;10 reps;Supine   Assessment/Plan    PT Assessment Patient needs continued PT services  PT Problem List Decreased strength;Decreased range of motion;Decreased activity tolerance;Decreased balance;Pain       PT Treatment Interventions DME instruction;Gait training;Functional mobility training;Therapeutic activities;Therapeutic exercise;Balance training;Patient/family education    PT Goals (Current goals can be found in the Care Plan section)  Acute Rehab PT Goals Patient Stated Goal: to have less R hip pain PT Goal Formulation: With patient Time For Goal Achievement: 12/25/18 Potential to Achieve Goals: Fair    Frequency Min 2X/week   Barriers to discharge Decreased caregiver support      Co-evaluation               AM-PAC PT "6 Clicks" Mobility  Outcome Measure Help needed turning from your back to your side while in a flat bed without using bedrails?: Total Help needed moving from lying on your back to sitting on the side of a flat bed without using bedrails?: Total Help needed moving to and from a bed to a chair (including a wheelchair)?: Total Help needed standing up from a chair using your arms (e.g., wheelchair or bedside chair)?: Total Help needed to walk in hospital room?: Total Help needed  climbing 3-5 steps with a railing? : Total 6 Click Score: 6    End of Session   Activity Tolerance: Patient limited by pain Patient left: in bed;with call bell/phone within reach;with bed alarm set Nurse Communication: Mobility status;Precautions;Patient requests pain meds;Other (comment)(pt's pain status) PT Visit Diagnosis: Other abnormalities of gait and mobility (R26.89);Muscle weakness (generalized) (M62.81);Difficulty in walking, not elsewhere classified (R26.2);Pain Pain - Right/Left: Right Pain - part of body: Hip    Time: 1345-1400 PT Time Calculation (min) (ACUTE ONLY): 15 min   Charges:   PT Evaluation $PT Eval Low Complexity: 1 Low         Jory Welke, PT 12/11/18, 2:57 PM [phone]

## 2018-12-12 ENCOUNTER — Inpatient Hospital Stay: Payer: Medicare (Managed Care)

## 2018-12-12 LAB — GLUCOSE, CAPILLARY
GLUCOSE-CAPILLARY: 120 mg/dL — AB (ref 70–99)
GLUCOSE-CAPILLARY: 154 mg/dL — AB (ref 70–99)
Glucose-Capillary: 128 mg/dL — ABNORMAL HIGH (ref 70–99)
Glucose-Capillary: 184 mg/dL — ABNORMAL HIGH (ref 70–99)

## 2018-12-12 NOTE — Consult Note (Signed)
ORTHOPAEDIC CONSULTATION  REQUESTING PHYSICIAN: Altamese Dilling, *  Chief Complaint:   Right hip pain.  History of Present Illness: [name] [name] is a 78 y.o. female with multiple medical problems, including diabetes, asthma, coronary artery disease with CHF, and hypothyroidism who lives independently.  Apparently, the patient was admitted several days ago for generalized weakness and the inability to get out of her chair.  She was found covered in her own feces and urine.  In the emergency room, she was noted to have a urinary tract infection and mild rhabdomyolysis, so she was admitted for further work-up.  Upon trying to mobilize her with physical therapy yesterday, the patient complained of severe right hip pain and refused any therapy.  Therefore, x-rays of the right hip were obtained and orthopedics was consulted.  The patient denies any falls or other injury which may have contributed to the onset of her symptoms.  She denies any numbness or paresthesias down her leg to her foot.  She localizes the pain to the lateral aspect of her right hip.  This pain does not radiate down her leg.  Past Medical History:  Diagnosis Date  . Allergic rhinitis   . Asthma   . 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 Diverticulitis   . Hypertensive heart disease with heart failure (HCC)   . Pain in right shoulder   . Primary open-angle glaucoma, bilateral, stage unspecified   . Thyroid disease    hypothroidism, unspecified  . Venous insufficiency (chronic) (peripheral)   . Vitamin D deficiency    Past Surgical History:  Procedure Laterality Date  . none     Social History   Socioeconomic History  . Marital status: Single    Spouse name: Not on file  . Number of children: Not on file  . Years of education: Not on file  . Highest education level: Not on file   Occupational History  . Not on file  Social Needs  . Financial resource strain: Not on file  . Food insecurity:    Worry: Not on file    Inability: Not on file  . Transportation needs:    Medical: Not on file    Non-medical: Not on file  Tobacco Use  . Smoking status: Never Smoker  . Smokeless tobacco: Never Used  Substance and Sexual Activity  . Alcohol use: No  . Drug use: No  . Sexual activity: Not on file  Lifestyle  . Physical activity:    Days per week: Not on file    Minutes per session: Not on file  . Stress: Not on file  Relationships  . Social connections:    Talks on phone: Not on file    Gets together: Not on file    Attends religious service: Not on file    Active member of club or organization: Not on file    Attends meetings of clubs or organizations: Not on file    Relationship status: Not on file  Other Topics Concern  . Not on file  Social History Narrative  . Not on file   Family History  Problem Relation Age of Onset  . Diabetes Mellitus II Mother    Allergies  Allergen Reactions  . Statins Other (See Comments)    Other reaction(s): UNKNOWN   Prior to Admission medications   Medication Sig Start Date End Date Taking? Authorizing Provider  acetaminophen (TYLENOL) 325 MG tablet Take 650  mg by mouth every 6 (six) hours as needed for mild pain.   Yes [provider]  albuterol (PROVENTIL HFA;VENTOLIN HFA) 108 (90 Base) MCG/ACT inhaler Inhale 2 puffs into the lungs every 4 (four) hours as needed for wheezing or shortness of breath.   Yes [provider]  aspirin EC 81 MG tablet Take 81 mg by mouth daily.   Yes [provider]  bimatoprost (LUMIGAN) 0.03 % ophthalmic solution Place 1 drop into both eyes at bedtime.    Yes [provider]  brimonidine (ALPHAGAN) 0.15 % ophthalmic solution Place 1 drop into both eyes 3 (three) times daily.    Yes [provider]  enalapril (VASOTEC) 20 MG tablet Take 20 mg  by mouth daily.   Yes [provider]  hydrocortisone (PROCTO-MED HC) 2.5 % rectal cream Place 1 application rectally every 4 (four) hours as needed for hemorrhoids or anal itching.   Yes [provider]  levothyroxine (SYNTHROID, LEVOTHROID) 75 MCG tablet Take 75 mcg by mouth daily before breakfast.   Yes [provider]  loperamide (IMODIUM) 2 MG capsule Take 2 mg by mouth as needed for diarrhea or loose stools (max 8 capsules daily).    Yes [provider]  metFORMIN (GLUCOPHAGE-XR) 500 MG 24 hr tablet Take 500 mg by mouth daily.   Yes [provider]  timolol (BETIMOL) 0.5 % ophthalmic solution Place 1 drop into both eyes 2 (two) times daily.   Yes [provider]   Dg Hip Unilat With Pelvis 2-3 Views Right  Result Date: 12/11/2018 CLINICAL DATA:  Right hip pain. EXAM: DG HIP (WITH OR WITHOUT PELVIS) 2-3V RIGHT COMPARISON:  None. FINDINGS: There is no evidence of hip fracture or dislocation. There is no evidence of arthropathy or other focal bone abnormality. IMPRESSION: Negative. Electronically Signed   By: Lupita Raider, M.D.   On: 12/11/2018 16:17    Positive ROS: All other systems have been reviewed and were otherwise negative with the exception of those mentioned in the HPI and as above.  Physical Exam: General:  Alert, no acute distress Psychiatric:  Patient is competent for consent with normal mood and affect   Cardiovascular:  No pedal edema Respiratory:  No wheezing, non-labored breathing GI:  Abdomen is soft and non-tender Skin:  No lesions in the area of chief complaint Neurologic:  Sensation intact distally Lymphatic:  No axillary or cervical lymphadenopathy  Orthopedic Exam:  Orthopedic examination is limited to the right hip and lower extremity.  Skin inspection around the right hip is unremarkable.  There is no swelling, erythema, ecchymosis, or other skin abnormalities identified.  Her right lower leg and foot is  notable for severe chronic venous stasis changes with blackened scaly skin in a sock-like distribution.  The patient has mild-moderate tenderness to palpation focally over the greater trochanteric region, but has no tenderness anteriorly.  She is able to tolerate logrolling of the right hip without any discomfort.  She also is able to tolerate hip flexion to 45 to 50 degrees with only mild discomfort.  She is unable to actively perform a straight leg raise, but is able to actively dorsiflex and plantarflex her toes and ankle.  Sensation is intact to the plantar aspect of her foot, as well as to all other distributions.  She has poor capillary refill to her right foot.  X-rays:  X-rays of the pelvis and right hip are available for review and have been reviewed by myself.  These films demonstrate mild to moderate degenerative changes of the right hip joint, but no obvious fractures or lytic lesions are identified.  Assessment: Right hip pain of unclear etiology.  Plan: The treatment options have been discussed with the patient.  Given that the patient complains of severe pain when she tries to sit up or stand up and refuses to be mobilized with physical therapy, I feel that we should proceed with obtaining an MRI scan of the right hip to be sure that there are no occult fractures or other significant pathology contributing to her pain.  Meanwhile, the patient may be treated symptomatically with appropriate pain medication as necessary.  Once the results of the MRI scan are known, more specific treatment suggestions can be made.  Thank you for asking me to participate in the care of this most pleasant yet unfortunate woman.  I will be happy to follow her with you.   Maryagnes Amos, MD  Beeper #:  [phone]  12/12/2018 9:45 AM

## 2018-12-12 NOTE — TOC Initial Note (Signed)
Transition of Care [facility]) - Initial/Assessment Note    Patient Details  Name: [name] [name] MRN: [id number] Date of Birth: 20-Apr-1941  [facility] CM/SW Contact:    Judi Cong, LCSW Phone Number: 12/12/2018, 3:21 PM  Clinical Narrative:  The CSW contacted the patient by phone to discuss her discharge planning needs and the PT recommendation for SNF. The patient was unsure as to her insurance (listed as generic Medicare Advantage). When asked if she is [facility] to [facility], the patient was unsure. According to the chart, the patient was [facility] to [facility] as recently as 03/15/2017 during her last admission.   The patient provided verbal permission to begin the SNF referral, and she shared that she was last at [facility] after the 03/15/2017 admission. The CSW began the referral and will provide bed offers as available. The patient also provided permission to contact [facility] to verify attachment. TOC team is following.              Expected Discharge Plan: Skilled Nursing Facility Barriers to Discharge: SNF Pending bed offer, Continued Medical Work up   Patient Goals and CMS Choice Patient states their goals for this hospitalization and ongoing recovery are:: "I want to be able to walk and not have my hip hurt me so much that I fall." CMS Medicare.gov Compare Post Acute Care list provided to:: Patient Choice offered to / list presented to : Patient  Expected Discharge Plan and Services Expected Discharge Plan: Skilled Nursing Facility In-house Referral: NA Discharge Planning Services: NA Post Acute Care Choice: Skilled Nursing Facility Living arrangements for the past 2 months: Apartment Expected Discharge Date: 12/12/18               DME Arranged: N/A DME Agency: NA HH Arranged: NA HH Agency: NA  Prior Living Arrangements/Services Living arrangements for the past 2 months: Apartment Lives with:: Self Patient language and need for interpreter reviewed:: Yes Do you feel  safe going back to the place where you live?: Yes      Need for Family Participation in Patient Care: No (Comment) Care giver support system in place?: Yes (comment) Current home services: Other (comment)(None) Criminal Activity/Legal Involvement Pertinent to Current Situation/Hospitalization: No - Comment as needed  Activities of Daily Living Home Assistive Devices/Equipment: Dan Humphreys (specify type) ADL Screening (condition at time of admission) Patient's cognitive ability adequate to safely complete daily activities?: Yes Is the patient deaf or have difficulty hearing?: No Does the patient have difficulty seeing, even when wearing glasses/contacts?: No Does the patient have difficulty concentrating, remembering, or making decisions?: No Patient able to express need for assistance with ADLs?: Yes Does the patient have difficulty dressing or bathing?: Yes Independently performs ADLs?: No Does the patient have difficulty walking or climbing stairs?: Yes Weakness of Legs: None Weakness of Arms/Hands: None  Permission Sought/Granted Permission sought to share information with : Facility Medical sales representative, Family Supports                Emotional Assessment Appearance:: Appears stated age Attitude/Demeanor/Rapport: Engaged Affect (typically observed): Appropriate Orientation: : Oriented to Self, Oriented to Place, Oriented to Situation, Oriented to  Time Alcohol / Substance Use: Never Used Psych Involvement: No (comment)  Admission diagnosis:  Acute cystitis without hematuria [N30.00] Generalized weakness [R53.1] Patient Active Problem List   Diagnosis Date Noted  . UTI (urinary tract infection) 12/10/2018  . Osteomyelitis (HCC) 03/13/2017  . Diabetic ulcer of toe of right foot associated with type 2 diabetes  mellitus, limited to breakdown of skin (HCC)   . DNR (do not resuscitate)   . Goals of care, counseling/discussion   . Palliative care encounter   . Diabetic foot  ulcer with osteomyelitis (HCC) 07/06/2015   PCP:  Idelle Leech, DO Pharmacy:   [facility], [place] - [address] [place] [address] Phone: [phone] Fax: [phone]     Social Determinants of Health (SDOH) Interventions    Readmission Risk Interventions No flowsheet data found.

## 2018-12-12 NOTE — Progress Notes (Signed)
[facility] [place] [facility]   PATIENT NAME: [name] [name]    MR#:  [id number]  DATE OF BIRTH:  November 29, 1940  SUBJECTIVE:  CHIEF COMPLAINT:   Chief Complaint  Patient presents with  . Weakness   Lives alone, has been falling down and weak lately and noted to be having UTI. Complains of weakness.  Had some pain in the right hip with physical therapy yesterday.  REVIEW OF SYSTEMS:  CONSTITUTIONAL: No fever,have fatigue or weakness.  EYES: No blurred or double vision.  EARS, NOSE, AND THROAT: No tinnitus or ear pain.  RESPIRATORY: No cough, shortness of breath, wheezing or hemoptysis.  CARDIOVASCULAR: No chest pain, orthopnea, edema.  GASTROINTESTINAL: No nausea, vomiting, diarrhea or abdominal pain.  GENITOURINARY: No dysuria, hematuria.  ENDOCRINE: No polyuria, nocturia,  HEMATOLOGY: No anemia, easy bruising or bleeding SKIN: No rash or lesion. MUSCULOSKELETAL: No joint pain or arthritis.   NEUROLOGIC: No tingling, numbness, weakness.  PSYCHIATRY: No anxiety or depression.   ROS  DRUG ALLERGIES:   Allergies  Allergen Reactions  . Statins Other (See Comments)    Other reaction(s): UNKNOWN    VITALS:  Blood pressure 121/60, pulse 80, temperature 98.9 F (37.2 C), temperature source Oral, resp. rate 20, height 5\' 11"  (1.803 m), weight 88.5 kg, SpO2 98 %.  PHYSICAL EXAMINATION:  GENERAL:  78 y.o.-year-old patient lying in the bed with no acute distress.  EYES: Pupils equal, round, reactive to light and accommodation. No scleral icterus. Extraocular muscles intact.  HEENT: Head atraumatic, normocephalic. Oropharynx and nasopharynx clear.  NECK:  Supple, no jugular venous distention. No thyroid enlargement, no tenderness.  LUNGS: Normal breath sounds bilaterally, no wheezing, rales,rhonchi or crepitation. No use of accessory muscles of respiration.  CARDIOVASCULAR: S1, S2 normal. No murmurs, rubs, or gallops.  ABDOMEN: Soft, nontender, nondistended.  Bowel sounds present. No organomegaly or mass.  EXTREMITIES: No pedal edema, cyanosis, or clubbing.  Chronic skin changes without any ulcerations or signs of infection on both legs. NEUROLOGIC: Cranial nerves II through XII are intact. Muscle strength 3-4/5 in all extremities. Sensation intact. Gait not checked.  PSYCHIATRIC: The patient is alert and oriented x 3.  SKIN: No obvious rash, lesion, or ulcer.   Physical Exam LABORATORY PANEL:   CBC Recent Labs  Lab 12/11/18 0602  WBC 7.5  HGB 11.5*  HCT 36.8  PLT 247   ------------------------------------------------------------------------------------------------------------------  Chemistries  Recent Labs  Lab 12/10/18 0332 12/11/18 0602  NA 137 136  K 3.8 3.6  CL 100 107  CO2 24 22  GLUCOSE 133* 157*  BUN 9 11  CREATININE 0.66 0.61  CALCIUM 9.3 8.3*  MG  --  1.9  AST 18  --   ALT 13  --   ALKPHOS 67  --   BILITOT 1.3*  --    ------------------------------------------------------------------------------------------------------------------  Cardiac Enzymes Recent Labs  Lab 12/10/18 0332  TROPONINI <0.03   ------------------------------------------------------------------------------------------------------------------  RADIOLOGY:  Dg Hip Unilat With Pelvis 2-3 Views Right  Result Date: 12/11/2018 CLINICAL DATA:  Right hip pain. EXAM: DG HIP (WITH OR WITHOUT PELVIS) 2-3V RIGHT COMPARISON:  None. FINDINGS: There is no evidence of hip fracture or dislocation. There is no evidence of arthropathy or other focal bone abnormality. IMPRESSION: Negative. Electronically Signed   By: Lupita Raider, M.D.   On: 12/11/2018 16:17    ASSESSMENT AND PLAN:   Active Problems:   UTI (urinary tract infection)  Patient is a 78 year old female admitted with urinary tract infection  and mild rhabdomyolysis.  1.  Urinary tract infection Patient started on IV Rocephin.  Clinically does not appear septic. Follow-up on urine  culture and sensitivities with plans to transition to p.o. antibiotics on discharge.  2.  Mild rhabdomyolysis Secondary to patient sitting on the chair for prolonged period of time.  Denied any fall. Continue IV fluid hydration.  Follow-up on CPK level in a.m. Improved.  3.  Generalized weakness Physical therapy to evaluate and treat Suggested SNF placement due to significant right hip pain.  4.  Hypothyroidism Continue Synthroid.  Checked TSH - acceptable.  5.  Hypertensive urgency Blood pressure better controlled.  Resume home dose of enalapril.  Placed on PRN IV hydralazine with parameters  6.  Diabetes mellitus type 2 Placed Metformin on hold.  Sliding scale insulin coverage.  Glycosylated hemoglobin level in a.m.  7. Right hip pain- get Xray to r/o injuries. X-rays were negative but due to patient's persistent complaint of pain I have called orthopedic consult who suggested to do MRI right hip.  DVT prophylaxis; Lovenox   All the records are reviewed and case discussed with Care Management/Social Workerr. Management plans discussed with the patient, family and they are in agreement.  CODE STATUS: Full.  TOTAL TIME TAKING CARE OF THIS PATIENT: 40 minutes.    POSSIBLE D/C IN 1-2 DAYS, DEPENDING ON CLINICAL CONDITION.   Altamese Dilling M.D on 12/12/2018   Between 7am to 6pm - Pager - [phone]  After 6pm go to [email or web address] - password [email or web address]  [facility]  Office  [phone]  CC: Primary care physician; Idelle Leech, DO  Note: This dictation was prepared with Dragon dictation along with smaller phrase technology. Any transcriptional errors that result from this process are unintentional.

## 2018-12-12 NOTE — NC FL2 (Signed)
[place] MEDICAID FL2 LEVEL OF CARE SCREENING TOOL     IDENTIFICATION  Patient Name: [name] [name] Birthdate: 02-21-1941 Sex: female Admission Date (Current Location): 12/10/2018  [place] and [place] Number:  Chiropodist and Address:  [facility], [address], [place], [place] [address]      Provider Number: [id number]  Attending Physician Name and Address:  Altamese Dilling, *  Relative Name and Phone Number:  Debora Mekonnen (Sister) [phone]    Current Level of Care: Hospital Recommended Level of Care: Skilled Nursing Facility Prior Approval Number:    Date Approved/Denied:   PASRR Number: [id number]  Discharge Plan: SNF    Current Diagnoses: Patient Active Problem List   Diagnosis Date Noted  . UTI (urinary tract infection) 12/10/2018  . Osteomyelitis (HCC) 03/13/2017  . Diabetic ulcer of toe of right foot associated with type 2 diabetes mellitus, limited to breakdown of skin (HCC)   . DNR (do not resuscitate)   . Goals of care, counseling/discussion   . Palliative care encounter   . Diabetic foot ulcer with osteomyelitis (HCC) 07/06/2015    Orientation RESPIRATION BLADDER Height & Weight     Self, Time, Place, Situation  Normal Continent Weight: 195 lb (88.5 kg) Height:  5\' 11"  (180.3 cm)  BEHAVIORAL SYMPTOMS/MOOD NEUROLOGICAL BOWEL NUTRITION STATUS      Continent Diet(Carb modified)  AMBULATORY STATUS COMMUNICATION OF NEEDS Skin   Extensive Assist Verbally Normal                       Personal Care Assistance Level of Assistance  Bathing, Feeding, Dressing Bathing Assistance: Limited assistance Feeding assistance: Independent Dressing Assistance: Limited assistance     Functional Limitations Info  Sight, Hearing, Speech Sight Info: Adequate Hearing Info: Adequate Speech Info: Adequate    SPECIAL CARE FACTORS FREQUENCY  PT (By licensed PT)     PT Frequency: 5X              Contractures  Contractures Info: Not present    Additional Factors Info  Code Status, Allergies Code Status Info: Full Allergies Info: Statins           Current Medications (12/12/2018):  This is the current hospital active medication list Current Facility-Administered Medications  Medication Dose Route Frequency Provider Last Rate Last Dose  . 0.9 %  sodium chloride infusion   Intravenous Continuous Ojie, Jude, MD   Stopped at 12/11/18 1128  . acetaminophen (TYLENOL) tablet 650 mg  650 mg Oral Q6H PRN Ojie, Jude, MD      . aspirin EC tablet 81 mg  81 mg Oral Daily Ojie, Jude, MD   81 mg at 12/12/18 0935  . brimonidine (ALPHAGAN) 0.15 % ophthalmic solution 1 drop  1 drop Left Eye TID Enid Baas, Jude, MD   1 drop at 12/12/18 0935  . cefTRIAXone (ROCEPHIN) 1 g in sodium chloride 0.9 % 100 mL IVPB  1 g Intravenous Q24H Altamese Dilling, MD 200 mL/hr at 12/12/18 0935 1 g at 12/12/18 0935  . enalapril (VASOTEC) tablet 20 mg  20 mg Oral Daily Ojie, Jude, MD   20 mg at 12/12/18 0935  . enoxaparin (LOVENOX) injection 40 mg  40 mg Subcutaneous Q24H Ojie, Jude, MD   40 mg at 12/12/18 0935  . hydrALAZINE (APRESOLINE) injection 10 mg  10 mg Intravenous Q6H PRN Ojie, Jude, MD      . insulin aspart (novoLOG) injection 0-5 Units  0-5 Units Subcutaneous QHS  Enid Baas, Jude, MD      . insulin aspart (novoLOG) injection 0-9 Units  0-9 Units Subcutaneous TID WC Enid Baas, Jude, MD   2 Units at 12/12/18 1242  . latanoprost (XALATAN) 0.005 % ophthalmic solution 1 drop  1 drop Left Eye QHS Ojie, Jude, MD   1 drop at 12/11/18 2200  . levothyroxine (SYNTHROID, LEVOTHROID) tablet 75 mcg  75 mcg Oral Q0600 Enid Baas, Jude, MD   75 mcg at 12/12/18 4098  . timolol (TIMOPTIC) 0.5 % ophthalmic solution 1 drop  1 drop Both Eyes BID Enid Baas, Jude, MD   1 drop at 12/12/18 0938  . Vitamin D (Ergocalciferol) (DRISDOL) capsule 50,000 Units  50,000 Units Oral Weekly Enid Baas, Jude, MD         Discharge Medications: Please see discharge summary for a list  of discharge medications.  Relevant Imaging Results:  Relevant Lab Results:   Additional Information SS#[id number]  Judi Cong, LCSW

## 2018-12-13 LAB — BASIC METABOLIC PANEL
Anion gap: 11 (ref 5–15)
BUN: 19 mg/dL (ref 8–23)
CHLORIDE: 103 mmol/L (ref 98–111)
CO2: 22 mmol/L (ref 22–32)
Calcium: 8.5 mg/dL — ABNORMAL LOW (ref 8.9–10.3)
Creatinine, Ser: 0.72 mg/dL (ref 0.44–1.00)
GFR calc Af Amer: >60 mL/min (ref >60)
GFR calc non Af Amer: >60 mL/min (ref >60)
Glucose, Bld: 155 mg/dL — ABNORMAL HIGH (ref 70–99)
Potassium: 3.8 mmol/L (ref 3.5–5.1)
Sodium: 136 mmol/L (ref 135–145)

## 2018-12-13 LAB — GLUCOSE, CAPILLARY
GLUCOSE-CAPILLARY: 154 mg/dL — AB (ref 70–99)
Glucose-Capillary: 133 mg/dL — ABNORMAL HIGH (ref 70–99)
Glucose-Capillary: 220 mg/dL — ABNORMAL HIGH (ref 70–99)
Glucose-Capillary: 193 mg/dL — ABNORMAL HIGH (ref 70–99)

## 2018-12-13 LAB — CBC
HCT: 37.9 % (ref 36.0–46.0)
Hemoglobin: 11.8 g/dL — ABNORMAL LOW (ref 12.0–15.0)
MCH: 26.5 pg (ref 26.0–34.0)
MCHC: 31.1 g/dL (ref 30.0–36.0)
MCV: 85 fL (ref 80.0–100.0)
Platelets: 237 10*3/uL (ref 150–400)
RBC: 4.46 MIL/uL (ref 3.87–5.11)
RDW: 14.6 % (ref 11.5–15.5)
WBC: 7.3 10*3/uL (ref 4.0–10.5)
nRBC: 0 % (ref 0.0–0.2)

## 2018-12-13 MED ORDER — OXYCODONE-ACETAMINOPHEN 5-325 MG PO TABS
1.0000 | ORAL_TABLET | Freq: Four times a day (QID) | ORAL | Status: DC | PRN
  Administered 2018-12-13: 1 via ORAL
  Filled 2018-12-13: qty 1

## 2018-12-13 MED ORDER — SENNOSIDES-DOCUSATE SODIUM 8.6-50 MG PO TABS
1.0000 | ORAL_TABLET | Freq: Two times a day (BID) | ORAL | Status: DC
  Administered 2018-12-13 – 2018-12-14 (×3): 1 via ORAL
  Filled 2018-12-13 (×4): qty 1

## 2018-12-13 MED ORDER — SODIUM CHLORIDE 0.9 % IV SOLN
INTRAVENOUS | Status: DC | PRN
  Administered 2018-12-13: 30 mL via INTRAVENOUS

## 2018-12-13 MED ORDER — CALAMINE EX LOTN
1.0000 "application " | TOPICAL_LOTION | Freq: Three times a day (TID) | CUTANEOUS | Status: DC
  Administered 2018-12-13 – 2018-12-14 (×4): 1 via TOPICAL
  Filled 2018-12-13 (×2): qty 177

## 2018-12-13 NOTE — Progress Notes (Signed)
Subjective: No new complaints.  Patient comfortable in bed, but has recurrence of pain when trying to sit up.   Objective: Vital signs in last 24 hours: Temp:  [98.6 F (37 C)-98.9 F (37.2 C)] 98.6 F (37 C) (03/30 0525) Pulse Rate:  [74-91] 74 (03/30 0525) Resp:  [18-20] 18 (03/30 0525) BP: (121-131)/(60-80) 131/80 (03/30 0525) SpO2:  [98 %-99 %] 99 % (03/30 0525)  Intake/Output from previous day: 03/29 0701 - 03/30 0700 In: 340.2 [P.O.:240; IV Piggyback:100.2] Out: 700 [Urine:700] Intake/Output this shift: No intake/output data recorded.  Recent Labs    12/11/18 0602 12/13/18 0403  HGB 11.5* 11.8*   Recent Labs    12/11/18 0602 12/13/18 0403  WBC 7.5 7.3  RBC 4.36 4.46  HCT 36.8 37.9  PLT 247 237   Recent Labs    12/11/18 0602 12/13/18 0403  NA 136 136  K 3.6 3.8  CL 107 103  CO2 22 22  BUN 11 19  CREATININE 0.61 0.72  GLUCOSE 157* 155*  CALCIUM 8.3* 8.5*   No results for input(s): LABPT, INR in the last 72 hours.  Physical Exam: Physical lamination is unchanged as compared to yesterday.   X-rays: The MRI scan from yesterday has been reviewed by myself.  By report, there is evidence of mild to moderate degenerative changes of the hip joint itself, but there is no evidence for any fractures around the hip or pelvis, nor is there any evidence of any soft tissue pathology such as gluteus medius tears or bursitis.  The principal finding is evidence of increased bone marrow edema in L5, possibly consistent with a compression fracture.  There also was increased signal in the L5-S1 disc space, possibly concerning for discitis.  Assessment: Right hip pain, probably secondary to L5 compression fracture.  Plan: The MRI results have been reviewed with the patient.  Based on the fact that the patient has no fevers and a low white count, I feel it is unlikely that she has discitis.  Therefore, I feel that the majority of her symptoms are coming from a mild L5  compression fracture.    The treatment options for this condition have been discussed with the patient.  The patient does not wish to consider a kyphoplasty at this time.  Therefore, I will not ask Dr. Rosita Kea to get involved.  The patient may be mobilized with physical therapy as symptoms permit.  Appropriate pain medication may be provided as necessary.   Excell Seltzer Malva Diesing 12/13/2018, 8:09 AM

## 2018-12-13 NOTE — Progress Notes (Signed)
Physical Therapy Treatment Patient Details Name: [name] [name] MRN: [id number] DOB: 1940-12-04 Today's Date: 12/13/2018    History of Present Illness Pt is a 78 y.o. female presenting to hospital after being unable to stand from recliner--pt covered in fecal material and urine.  Pt admitted with UTI, mild rhabdomyolysis, generalized weakness, and hypertensive urgency.  PMH includes DM, R shoulder pain, asthma, osteomyelitis 03/13/17, and diabetic ulcer R toe.    PT Comments    Pt agreeable to PT; reports pain in R hip that increases with mobility to edge of bed and seated weight bearing. Pt requires Mod A of 2 for supine to sit and Max to return to supine. Increased time to attain safe upright sitting pattern with supervision, as initially pt pushes trunk back sliding hips to far forward. Pt able to tolerate sitting with limited BLE exercises with encouragement (pt often asking to return to bed). Pt encouraged to perform isometrics in the bed. Pt requesting pain medications; nursing notified. Continue PT to progress strength and endurance to improve all functional mobility.    Follow Up Recommendations  SNF     Equipment Recommendations       Recommendations for Other Services       Precautions / Restrictions Precautions Precautions: Fall Restrictions Weight Bearing Restrictions: No    Mobility  Bed Mobility Overal bed mobility: Needs Assistance Bed Mobility: Supine to Sit;Sit to Supine     Supine to sit: Mod assist;+2 for physical assistance;HOB elevated Sit to supine: Max assist;+2 for physical assistance;HOB elevated   General bed mobility comments: Pt pushes trunk back with initial sitting attempts sliding hips to far forward toward edge of bed. With cues, assist and time able to get pt to a more upright sitting position with hips safely on bed progressing from Min A/guard to supervised sitting.  Transfers                 General transfer comment: Pt declines  attempt despite encouragement  Ambulation/Gait                 Stairs             Wheelchair Mobility    Modified Rankin (Stroke Patients Only)       Balance                                            Cognition Arousal/Alertness: Awake/alert Behavior During Therapy: WFL for tasks assessed/performed Overall Cognitive Status: Within Functional Limits for tasks assessed                                        Exercises General Exercises - Lower Extremity Ankle Circles/Pumps: AROM;Both;10 reps Quad Sets: Strengthening;Both;10 reps Gluteal Sets: Strengthening;Both;10 reps Long Arc Quad: AROM;Strengthening;Right;10 reps;Seated Hip Flexion/Marching: AROM;Strengthening;Both;Other (comment)(100, very low clearance esp R) Other Exercises Other Exercises: acquiring static sit/sitting tolerance with decreased assist    General Comments        Pertinent Vitals/Pain Pain Assessment: Faces Faces Pain Scale: Hurts whole lot(with seated weigh bearing) Pain Location: R hip Pain Descriptors / Indicators: Aching;Grimacing;Moaning Pain Intervention(s): Limited activity within patient's tolerance;Monitored during session;Patient requesting pain meds-RN notified    Home Living  Prior Function            PT Goals (current goals can now be found in the care plan section) Progress towards PT goals: Progressing toward goals(slowly)    Frequency    Min 2X/week      PT Plan Current plan remains appropriate    Co-evaluation              AM-PAC PT "6 Clicks" Mobility   Outcome Measure  Help needed turning from your back to your side while in a flat bed without using bedrails?: A Lot Help needed moving from lying on your back to sitting on the side of a flat bed without using bedrails?: A Lot Help needed moving to and from a bed to a chair (including a wheelchair)?: Total Help needed standing up  from a chair using your arms (e.g., wheelchair or bedside chair)?: Total Help needed to walk in hospital room?: Total Help needed climbing 3-5 steps with a railing? : Total 6 Click Score: 8    End of Session   Activity Tolerance: Patient limited by fatigue;Patient limited by pain Patient left: in bed;with call bell/phone within reach;with bed alarm set Nurse Communication: Patient requests pain meds PT Visit Diagnosis: Other abnormalities of gait and mobility (R26.89);Muscle weakness (generalized) (M62.81);Difficulty in walking, not elsewhere classified (R26.2);Pain Pain - Right/Left: Right Pain - part of body: Hip     Time: 1607-3710 PT Time Calculation (min) (ACUTE ONLY): 18 min  Charges:  $Therapeutic Activity: 8-22 mins                      Scot Dock, PTA 12/13/2018, 3:03 PM

## 2018-12-13 NOTE — TOC Progression Note (Signed)
Transition of Care [facility]) - Progression Note    Patient Details  Name: [name] [name] MRN: [id number] Date of Birth: Jul 18, 1941  [facility]) CM/SW Contact  Halford Chessman Phone Number: 12/13/2018, 4:43 PM  Clinical Narrative: CSW received phone call from Dr. Joannie Springs from [facility] program, and they would like her to go to SNF.  CSW contacted [facility] to find out which facility they would like her to go to, and they would prefer [facility].  CSW contacted [facility] and they can accept patient once she is medically ready for discharge and orders have been received.  [facility] needs to be updated once patient is ready for discharge so they can arrange transportation.     Expected Discharge Plan: Skilled Nursing Facility Barriers to Discharge: SNF Pending bed offer, Continued Medical Work up  Expected Discharge Plan and Services Expected Discharge Plan: Skilled Nursing Facility In-house Referral: NA Discharge Planning Services: NA Post Acute Care Choice: Skilled Nursing Facility Living arrangements for the past 2 months: Apartment Expected Discharge Date: 12/12/18               DME Arranged: N/A DME Agency: NA HH Arranged: NA HH Agency: NA   Social Determinants of Health (SDOH) Interventions    Readmission Risk Interventions No flowsheet data found.

## 2018-12-13 NOTE — Progress Notes (Signed)
[facility] [place] at [facility]   PATIENT NAME: [name] [name]    MR#:  [id number]  DATE OF BIRTH:  23-Nov-1940  SUBJECTIVE:  CHIEF COMPLAINT:   Chief Complaint  Patient presents with  . Weakness   Lives alone, has been falling down and weak lately and noted to be having UTI. Complains of weakness.  Had some pain in the right hip with physical therapy yesterday.  REVIEW OF SYSTEMS:  CONSTITUTIONAL: No fever,have fatigue or weakness.  EYES: No blurred or double vision.  EARS, NOSE, AND THROAT: No tinnitus or ear pain.  RESPIRATORY: No cough, shortness of breath, wheezing or hemoptysis.  CARDIOVASCULAR: No chest pain, orthopnea, edema.  GASTROINTESTINAL: No nausea, vomiting, diarrhea or abdominal pain.  GENITOURINARY: No dysuria, hematuria.  ENDOCRINE: No polyuria, nocturia,  HEMATOLOGY: No anemia, easy bruising or bleeding SKIN: No rash or lesion. MUSCULOSKELETAL: No joint pain or arthritis.   NEUROLOGIC: No tingling, numbness, weakness.  PSYCHIATRY: No anxiety or depression.   ROS  DRUG ALLERGIES:   Allergies  Allergen Reactions  . Statins Other (See Comments)    Other reaction(s): UNKNOWN    VITALS:  Blood pressure 140/62, pulse 71, temperature 98 F (36.7 C), temperature source Oral, resp. rate 18, height 5\' 11"  (1.803 m), weight 88.5 kg, SpO2 99 %.  PHYSICAL EXAMINATION:  GENERAL:  78 y.o.-year-old patient lying in the bed with no acute distress.  EYES: Pupils equal, round, reactive to light and accommodation. No scleral icterus. Extraocular muscles intact.  HEENT: Head atraumatic, normocephalic. Oropharynx and nasopharynx clear.  NECK:  Supple, no jugular venous distention. No thyroid enlargement, no tenderness.  LUNGS: Normal breath sounds bilaterally, no wheezing, rales,rhonchi or crepitation. No use of accessory muscles of respiration.  CARDIOVASCULAR: S1, S2 normal. No murmurs, rubs, or gallops.  ABDOMEN: Soft, nontender, nondistended.  Bowel sounds present. No organomegaly or mass.  EXTREMITIES: No pedal edema, cyanosis, or clubbing.  Chronic skin changes without any ulcerations or signs of infection on both legs. NEUROLOGIC: Cranial nerves II through XII are intact. Muscle strength 3-4/5 in all extremities. Sensation intact. Gait not checked.  PSYCHIATRIC: The patient is alert and oriented x 3.  SKIN: No obvious rash, lesion, or ulcer.   Physical Exam LABORATORY PANEL:   CBC Recent Labs  Lab 12/13/18 0403  WBC 7.3  HGB 11.8*  HCT 37.9  PLT 237   ------------------------------------------------------------------------------------------------------------------  Chemistries  Recent Labs  Lab 12/10/18 0332 12/11/18 0602 12/13/18 0403  NA 137 136 136  K 3.8 3.6 3.8  CL 100 107 103  CO2 24 22 22   GLUCOSE 133* 157* 155*  BUN 9 11 19   CREATININE 0.66 0.61 0.72  CALCIUM 9.3 8.3* 8.5*  MG  --  1.9  --   AST 18  --   --   ALT 13  --   --   ALKPHOS 67  --   --   BILITOT 1.3*  --   --    ------------------------------------------------------------------------------------------------------------------  Cardiac Enzymes Recent Labs  Lab 12/10/18 0332  TROPONINI <0.03   ------------------------------------------------------------------------------------------------------------------  RADIOLOGY:  Mr Hip Right Wo Contrast  Result Date: 12/12/2018 CLINICAL DATA:  Acute onset right hip pain. EXAM: MR OF THE RIGHT HIP WITHOUT CONTRAST TECHNIQUE: Multiplanar, multisequence MR imaging was performed. No intravenous contrast was administered. COMPARISON:  None. FINDINGS: Bones: There is abnormal and some reduction in height of the L5 vertebral body suspicious for potential compression fracture. There is also edema in the L5-S1 intervertebral disc. Although  discitis is not readily excluded, there is no marrow edema in the S1 vertebral body, and the disc edema could be stair up. No hip fracture is identified. Articular  cartilage and labrum Articular cartilage: Moderate degenerative chondral thinning especially axillae. Labrum:  Grossly unremarkable Joint or bursal effusion Joint effusion:  Absent Bursae: No significant regional bursitis Muscles and tendons Muscles and tendons:  Unremarkable Other findings Miscellaneous:   No supplemental non-categorized findings. IMPRESSION: 1. Reduced vertebral height at L5 with diffuse marrow edema in the L5 vertebral body as well as edema in the L5-S1 intervertebral disc. This could be from an acute or subacute L5 compression fracture with sterile disc edema. Discitis with osteomyelitis involving L5 is a differential diagnostic consideration, correlate with signs and symptoms of infection in determining whether further discitis workup is warranted. 2. Moderate degenerative chondral thinning in the right hip. Electronically Signed   By: Gaylyn Rong M.D.   On: 12/12/2018 13:30   Dg Hip Unilat With Pelvis 2-3 Views Right  Result Date: 12/11/2018 CLINICAL DATA:  Right hip pain. EXAM: DG HIP (WITH OR WITHOUT PELVIS) 2-3V RIGHT COMPARISON:  None. FINDINGS: There is no evidence of hip fracture or dislocation. There is no evidence of arthropathy or other focal bone abnormality. IMPRESSION: Negative. Electronically Signed   By: Lupita Raider, M.D.   On: 12/11/2018 16:17    ASSESSMENT AND PLAN:   Active Problems:   UTI (urinary tract infection)  Patient is a 78 year old female admitted with urinary tract infection and mild rhabdomyolysis.  1.  Urinary tract infection Patient started on IV Rocephin.  Clinically does not appear septic. Follow-up on urine culture and sensitivities with plans to transition to p.o. antibiotics on discharge.  2.  Mild rhabdomyolysis Secondary to patient sitting on the chair for prolonged period of time.  Denied any fall. Continue IV fluid hydration.  Follow-up on CPK level in a.m. Improved.  3.  Generalized weakness Physical therapy to  evaluate and treat Suggested SNF placement due to significant right hip pain.  4.  Hypothyroidism Continue Synthroid.  Checked TSH - acceptable.   5.  Hypertensive urgency Blood pressure better controlled.  Resume home dose of enalapril.  Placed on PRN IV hydralazine with parameters  6.  Diabetes mellitus type 2 Placed Metformin on hold.  Sliding scale insulin coverage.    HbA1c 7.5.  7. Right hip pain- get Xray to r/o injuries. X-rays were negative but due to patient's persistent complaint of pain I have called orthopedic consult who suggested to do MRI right hip. MRI suggest L5 compression fracture.  Orthopedic physician spoke to patient and discussed about surgical options but she refused and chose the option of pain control and physical therapy. I have ordered Percocet for pain control and likely discharge tomorrow to a rehab center.  DVT prophylaxis; Lovenox   All the records are reviewed and case discussed with Care Management/Social Workerr. Management plans discussed with the patient, family and they are in agreement.  CODE STATUS: Full.  TOTAL TIME TAKING CARE OF THIS PATIENT: 40 minutes.    POSSIBLE D/C IN 1-2 DAYS, DEPENDING ON CLINICAL CONDITION.   Altamese Dilling M.D on 12/13/2018   Between 7am to 6pm - Pager - [phone]  After 6pm go to [email or web address] - password [email or web address]  [facility] [place] [facility]  Office  [phone]  CC: Primary care physician; Idelle Leech, DO  Note: This dictation was prepared with Dragon dictation along with smaller phrase technology. Any transcriptional errors that result  from this process are unintentional.

## 2018-12-13 NOTE — Care Management Important Message (Signed)
Important Message  Patient Details  Name: [name] [name] MRN: [id number] Date of Birth: 1941-06-05   Medicare Important Message Given:  Yes    Johnell Comings 12/13/2018, 1:10 PM

## 2018-12-14 LAB — URINE CULTURE: Culture: >=100000 — AB

## 2018-12-14 LAB — GLUCOSE, CAPILLARY
GLUCOSE-CAPILLARY: 134 mg/dL — AB (ref 70–99)
Glucose-Capillary: 116 mg/dL — ABNORMAL HIGH (ref 70–99)

## 2018-12-14 MED ORDER — CALAMINE EX LOTN: 1.0000 "application " | TOPICAL_LOTION | Freq: Three times a day (TID) | CUTANEOUS | 0 refills | Status: DC

## 2018-12-14 MED ORDER — SULFAMETHOXAZOLE-TRIMETHOPRIM 800-160 MG PO TABS: 1.0000 | ORAL_TABLET | Freq: Two times a day (BID) | ORAL | 0 refills | Status: AC

## 2018-12-14 MED ORDER — OXYCODONE-ACETAMINOPHEN 5-325 MG PO TABS: 1.0000 | ORAL_TABLET | Freq: Four times a day (QID) | ORAL | 0 refills | Status: DC | PRN

## 2018-12-14 MED ORDER — SULFAMETHOXAZOLE-TRIMETHOPRIM 800-160 MG PO TABS
1.0000 | ORAL_TABLET | Freq: Two times a day (BID) | ORAL | Status: DC
  Administered 2018-12-14: 1 via ORAL
  Filled 2018-12-14 (×2): qty 1

## 2018-12-14 MED ORDER — SENNOSIDES-DOCUSATE SODIUM 8.6-50 MG PO TABS: 1.0000 | ORAL_TABLET | Freq: Two times a day (BID) | ORAL | 0 refills | Status: DC

## 2018-12-14 MED ORDER — ENOXAPARIN SODIUM 40 MG/0.4ML ~~LOC~~ SOLN: 40.0000 mg | SUBCUTANEOUS | 0 refills | Status: DC

## 2018-12-14 NOTE — TOC Transition Note (Signed)
Transition of Care [facility]) - CM/SW Discharge Note   Patient Details  Name: [name] [name] MRN: [id number] Date of Birth: Jan 06, 1941  Transition of Care [facility]) CM/SW Contact:  York Spaniel, LCSW Phone Number: 12/14/2018, 12:08 PM   Clinical Narrative:   Patient discharging today to [facility]. [facility] at [facility] is aware and discharge information sent. Trudy at [facility] is also aware and will be providing transportation. CSW has contacted patient's sister: Ariadna Becton: [phone].    Final next level of care: Skilled Nursing Facility Barriers to Discharge: No Barriers Identified   Patient Goals and CMS Choice Patient states their goals for this hospitalization and ongoing recovery are:: "I want to be able to walk and not have my hip hurt me so much that I fall." CMS Medicare.gov Compare Post Acute Care list provided to:: Patient Choice offered to / list presented to : ([facility] program chose which facility patient was going to go to due to contract constrictions)  Discharge Placement   Existing PASRR number confirmed : 12/12/18          Patient chooses bed at: ([facility]) Patient to be transferred to facility by: [facility] Name of family member notified: sister:  Patient and family notified of of transfer: 12/14/18  Discharge Plan and Services In-house Referral: NA Discharge Planning Services: NA Post Acute Care Choice: Skilled Nursing Facility          DME Arranged: N/A DME Agency: NA HH Arranged: NA HH Agency: NA   Social Determinants of Health (SDOH) Interventions     Readmission Risk Interventions No flowsheet data found.

## 2018-12-14 NOTE — Discharge Summary (Signed)
[facility] [place] at [facility]   PATIENT NAME: [name] [name]    MR#:  [id number]  DATE OF BIRTH:  05/07/1941  DATE OF ADMISSION:  12/10/2018 ADMITTING PHYSICIAN: Jama Flavors, MD  DATE OF DISCHARGE: 12/14/2018   PRIMARY CARE PHYSICIAN: Idelle Leech, DO    ADMISSION DIAGNOSIS:  Acute cystitis without hematuria [N30.00] Generalized weakness [R53.1]  DISCHARGE DIAGNOSIS:  Active Problems:   UTI (urinary tract infection)    Vertebral compression fracture  SECONDARY DIAGNOSIS:   Past Medical History:  Diagnosis Date  . Allergic rhinitis   . Asthma   . 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 Diverticulitis   . Hypertensive heart disease with heart failure (HCC)   . Pain in right shoulder   . Primary open-angle glaucoma, bilateral, stage unspecified   . Thyroid disease    hypothroidism, unspecified  . Venous insufficiency (chronic) (peripheral)   . Vitamin D deficiency     HOSPITAL COURSE:   Patient is a 78 year old female admitted with urinary tract infection and mild rhabdomyolysis.  1.Urinary tract infection Patient started on IV Rocephin. Clinically does not appear septic. Follow-up on urine culture and sensitivities with plans to transition to p.o. antibiotics on discharge.  Cx reports ESBL sensitive to bactrim- give for 7 days on discharge.  2.Mild rhabdomyolysis Secondary to patient sitting on the chair for prolonged period of time. Denied any fall. Continue IV fluid hydration. Follow-up on CPK level in a.m. Improved.  3.Generalized weakness Physical therapy to evaluate and treat Suggested SNF placement due to significant right hip pain.  4.Hypothyroidism Continue Synthroid. Checked TSH - acceptable.   5.Hypertensive urgency Blood pressure better controlled. Resume home dose of enalapril. Placed on PRN IV hydralazine with  parameters  6.Diabetes mellitus type 2 Placed Metformin on hold. Sliding scale insulin coverage.   HbA1c 7.5.  7. Right hip pain- get Xray to r/o injuries. X-rays were negative but due to patient's persistent complaint of pain I have called orthopedic consult who suggested to do MRI right hip. MRI suggest L5 compression fracture.  Orthopedic physician spoke to patient and discussed about surgical options but she refused and chose the option of pain control and physical therapy. I have ordered Percocet for pain control and likely discharge tomorrow to a rehab center.  DISCHARGE CONDITIONS:   Stable.  CONSULTS OBTAINED:  Treatment Team:  Christena Flake, MD  DRUG ALLERGIES:   Allergies  Allergen Reactions  . Statins Other (See Comments)    Other reaction(s): UNKNOWN    DISCHARGE MEDICATIONS:   Allergies as of 12/14/2018      Reactions   Statins Other (See Comments)   Other reaction(s): UNKNOWN      Medication List    TAKE these medications   acetaminophen 325 MG tablet Commonly known as:  TYLENOL Take 650 mg by mouth every 6 (six) hours as needed for mild pain.   albuterol 108 (90 Base) MCG/ACT inhaler Commonly known as:  PROVENTIL HFA;VENTOLIN HFA Inhale 2 puffs into the lungs every 4 (four) hours as needed for wheezing or shortness of breath.   aspirin EC 81 MG tablet Take 81 mg by mouth daily.   bimatoprost 0.03 % ophthalmic solution Commonly known as:  LUMIGAN Place 1 drop into both eyes at bedtime.   brimonidine 0.15 % ophthalmic solution Commonly known as:  ALPHAGAN Place 1 drop into both eyes 3 (three) times daily.   calamine  lotion Apply 1 application topically 3 (three) times daily.   enalapril 20 MG tablet Commonly known as:  VASOTEC Take 20 mg by mouth daily.   enoxaparin 40 MG/0.4ML injection Commonly known as:  LOVENOX Inject 0.4 mLs (40 mg total) into the skin daily for 7 days. Start taking on:  December 15, 2018   levothyroxine 75 MCG  tablet Commonly known as:  SYNTHROID, LEVOTHROID Take 75 mcg by mouth daily before breakfast.   loperamide 2 MG capsule Commonly known as:  IMODIUM Take 2 mg by mouth as needed for diarrhea or loose stools (max 8 capsules daily).   metFORMIN 500 MG 24 hr tablet Commonly known as:  GLUCOPHAGE-XR Take 500 mg by mouth daily.   oxyCODONE-acetaminophen 5-325 MG tablet Commonly known as:  PERCOCET/ROXICET Take 1 tablet by mouth every 6 (six) hours as needed for moderate pain or severe pain.   Procto-Med HC 2.5 % rectal cream Generic drug:  hydrocortisone Place 1 application rectally every 4 (four) hours as needed for hemorrhoids or anal itching.   senna-docusate 8.6-50 MG tablet Commonly known as:  Senokot-S Take 1 tablet by mouth 2 (two) times daily.   sulfamethoxazole-trimethoprim 800-160 MG tablet Commonly known as:  BACTRIM DS,SEPTRA DS Take 1 tablet by mouth every 12 (twelve) hours for 7 days.   timolol 0.5 % ophthalmic solution Commonly known as:  BETIMOL Place 1 drop into both eyes 2 (two) times daily.        DISCHARGE INSTRUCTIONS:    Follow with PMD in 1-2 weeks.  If you experience worsening of your admission symptoms, develop shortness of breath, life threatening emergency, suicidal or homicidal thoughts you must seek medical attention immediately by calling 911 or calling your MD immediately  if symptoms less severe.  You Must read complete instructions/literature along with all the possible adverse reactions/side effects for all the Medicines you take and that have been prescribed to you. Take any new Medicines after you have completely understood and accept all the possible adverse reactions/side effects.   Please note  You were cared for by a hospitalist during your hospital stay. If you have any questions about your discharge medications or the care you received while you were in the hospital after you are discharged, you can call the unit and asked to speak  with the hospitalist on call if the hospitalist that took care of you is not available. Once you are discharged, your primary care physician will handle any further medical issues. Please note that NO REFILLS for any discharge medications will be authorized once you are discharged, as it is imperative that you return to your primary care physician (or establish a relationship with a primary care physician if you do not have one) for your aftercare needs so that they can reassess your need for medications and monitor your lab values.    Today   CHIEF COMPLAINT:   Chief Complaint  Patient presents with  . Weakness    HISTORY OF PRESENT ILLNESS:  [name] [name]  is a 78 y.o. female with a known history of hypertension, diabetes mellitus and asthma who was brought into the emergency room via EMS with complaints of generalized weakness.  Patient apparently was unable to stand from the recliner chair and was said to have been started with feces and urine.  Patient denied any fevers.  No cough.  No shortness of breath.  No nausea vomiting.  No sick contacts.  No abdominal pains.  Patient lives alone at home.  Patient was evaluated emergency room and found to have evidence of mild rhabdomyolysis and urinary tract infection.  Started on IV Rocephin and IV fluids.  Medical service called to admit patient for further evaluation and management.   VITAL SIGNS:  Blood pressure 117/65, pulse 80, temperature 98 F (36.7 C), temperature source Oral, resp. rate 18, height  (1.803 m), weight 88.5 kg, SpO2 100 %.  I/O:    Intake/Output Summary (Last 24 hours) at 12/14/2018 1127 Last data filed at 12/14/2018 0605 Gross per 24 hour  Intake 40 ml  Output 700 ml  Net -660 ml    PHYSICAL EXAMINATION:   GENERAL:  78 y.o.-year-old patient lying in the bed with no acute distress.  EYES: Pupils equal, round, reactive to light and accommodation. No scleral icterus. Extraocular muscles intact.  HEENT: Head  atraumatic, normocephalic. Oropharynx and nasopharynx clear.  NECK:  Supple, no jugular venous distention. No thyroid enlargement, no tenderness.  LUNGS: Normal breath sounds bilaterally, no wheezing, rales,rhonchi or crepitation. No use of accessory muscles of respiration.  CARDIOVASCULAR: S1, S2 normal. No murmurs, rubs, or gallops.  ABDOMEN: Soft, nontender, nondistended. Bowel sounds present. No organomegaly or mass.  EXTREMITIES: No pedal edema, cyanosis, or clubbing.  Chronic skin changes without any ulcerations or signs of infection on both legs. NEUROLOGIC: Cranial nerves II through XII are intact. Muscle strength 3-4/5 in all extremities. Sensation intact. Gait not checked.  PSYCHIATRIC: The patient is alert and oriented x 3.  SKIN: No obvious rash, lesion, or ulcer.   DATA REVIEW:   CBC Recent Labs  Lab 12/13/18 0403  WBC 7.3  HGB 11.8*  HCT 37.9  PLT 237    Chemistries  Recent Labs  Lab 12/10/18 0332 12/11/18 0602 12/13/18 0403  NA 137 136 136  K 3.8 3.6 3.8  CL 100 107 103  CO2 GLUCOSE 133* 157* 155*  BUN CREATININE 0.66 0.61 0.72  CALCIUM 9.3 8.3* 8.5*  MG  --  1.9  --   AST 18  --   --   ALT 13  --   --   ALKPHOS 67  --   --   BILITOT 1.3*  --   --     Cardiac Enzymes Recent Labs  Lab 12/10/18 0332  TROPONINI <0.03    Microbiology Results  Results for orders placed or performed during the hospital encounter of 12/10/18  Urine Culture     Status: Abnormal   Collection Time: 12/11/18 12:04 PM  Result Value Ref Range Status   Specimen Description   Final    URINE, RANDOM Performed at [facility], [address] [place], [place] [address]    Special Requests   Final    NONE Performed at [facility], [address] [place], [place] [address]    Culture (A)  Final    >=100,000 COLONIES/mL ESCHERICHIA COLI Confirmed Extended Spectrum Beta-Lactamase Producer (ESBL).  In bloodstream infections from ESBL  organisms, carbapenems are preferred over piperacillin/tazobactam. They are shown to have a lower risk of mortality.    Report Status 12/14/2018 FINAL  Final   Organism ID, Bacteria ESCHERICHIA COLI (A)  Final      Susceptibility   Escherichia coli - MIC*    AMPICILLIN >=32 RESISTANT Resistant     CEFAZOLIN >=64 RESISTANT Resistant     CEFTRIAXONE >=64 RESISTANT Resistant     CIPROFLOXACIN >=4 RESISTANT Resistant     GENTAMICIN <=1  SENSITIVE Sensitive     IMIPENEM <=0.25 SENSITIVE Sensitive     NITROFURANTOIN 64 INTERMEDIATE Intermediate     TRIMETH/SULFA <=20 SENSITIVE Sensitive     AMPICILLIN/SULBACTAM >=32 RESISTANT Resistant     PIP/TAZO 8 SENSITIVE Sensitive     Extended ESBL POSITIVE Resistant     * >=100,000 COLONIES/mL ESCHERICHIA COLI    RADIOLOGY:  Mr Hip Right Wo Contrast  Result Date: 12/12/2018 CLINICAL DATA:  Acute onset right hip pain. EXAM: MR OF THE RIGHT HIP WITHOUT CONTRAST TECHNIQUE: Multiplanar, multisequence MR imaging was performed. No intravenous contrast was administered. COMPARISON:  None. FINDINGS: Bones: There is abnormal and some reduction in height of the L5 vertebral body suspicious for potential compression fracture. There is also edema in the L5-S1 intervertebral disc. Although discitis is not readily excluded, there is no marrow edema in the S1 vertebral body, and the disc edema could be stair up. No hip fracture is identified. Articular cartilage and labrum Articular cartilage: Moderate degenerative chondral thinning especially axillae. Labrum:  Grossly unremarkable Joint or bursal effusion Joint effusion:  Absent Bursae: No significant regional bursitis Muscles and tendons Muscles and tendons:  Unremarkable Other findings Miscellaneous:   No supplemental non-categorized findings. IMPRESSION: 1. Reduced vertebral height at L5 with diffuse marrow edema in the L5 vertebral body as well as edema in the L5-S1 intervertebral disc. This could be from an acute or  subacute L5 compression fracture with sterile disc edema. Discitis with osteomyelitis involving L5 is a differential diagnostic consideration, correlate with signs and symptoms of infection in determining whether further discitis workup is warranted. 2. Moderate degenerative chondral thinning in the right hip. Electronically Signed   By: Gaylyn Rong M.D.   On: 12/12/2018 13:30    EKG:   Orders placed or performed during the hospital encounter of 12/10/18  . ED EKG  . ED EKG      Management plans discussed with the patient, family and they are in agreement.  CODE STATUS:     Code Status Orders  (From admission, onward)         Start     Ordered   12/10/18 0932  Full code  Continuous     12/10/18 0932        Code Status History    Date Active Date Inactive Code Status Order ID Comments User Context   03/13/2017 1351 03/15/2017 1815 DNR [id number]  Ulice Bold, NP Inpatient   03/13/2017 0726 03/13/2017 1351 Full Code [id number]  Arnaldo Natal, MD Inpatient      TOTAL TIME TAKING CARE OF THIS PATIENT: 35 minutes.    Altamese Dilling M.D on 12/14/2018 at 11:27 AM  Between 7am to 6pm - Pager - [phone]  After 6pm go to [email or web address] - password [email or web address]  [facility] [place] [facility]  Office  [phone]  CC: Primary care physician; Idelle Leech, DO   Note: This dictation was prepared with Dragon dictation along with smaller phrase technology. Any transcriptional errors that result from this process are unintentional.

## 2018-12-14 NOTE — Progress Notes (Signed)
PT Cancellation Note  Patient Details Name: [name] [name] MRN: [id number] DOB: June 19, 1941   Cancelled Treatment:    Reason Eval/Treat Not Completed: Other (comment)(patient in process of d/c) . Patient in process of d/c, lift waiting in hall, waiting on transport vehicle.   Precious Bard, PT, DPT   12/14/2018, 2:40 PM

## 2019-02-07 ENCOUNTER — Emergency Department: Payer: Medicare (Managed Care)

## 2019-02-07 ENCOUNTER — Inpatient Hospital Stay
Admission: EM | Admit: 2019-02-07 | Discharge: 2019-02-10 | DRG: 603 | Disposition: A | Discharge status: Skilled Nursing Facility | Payer: Medicare (Managed Care) | Attending: Family Medicine | Admitting: Family Medicine

## 2019-02-07 ENCOUNTER — Encounter: Payer: Self-pay | Admitting: Emergency Medicine

## 2019-02-07 ENCOUNTER — Other Ambulatory Visit: Payer: Self-pay

## 2019-02-07 DIAGNOSIS — J45909 Unspecified asthma, uncomplicated: Secondary | ICD-10-CM | POA: Diagnosis present

## 2019-02-07 DIAGNOSIS — E039 Hypothyroidism, unspecified: Secondary | ICD-10-CM | POA: Diagnosis present

## 2019-02-07 DIAGNOSIS — L03116 Cellulitis of left lower limb: Secondary | ICD-10-CM | POA: Diagnosis present

## 2019-02-07 DIAGNOSIS — L03115 Cellulitis of right lower limb: Principal | ICD-10-CM | POA: Diagnosis present

## 2019-02-07 DIAGNOSIS — W19XXXA Unspecified fall, initial encounter: Secondary | ICD-10-CM

## 2019-02-07 DIAGNOSIS — Z7982 Long term (current) use of aspirin: Secondary | ICD-10-CM

## 2019-02-07 DIAGNOSIS — Z1159 Encounter for screening for other viral diseases: Secondary | ICD-10-CM

## 2019-02-07 DIAGNOSIS — Z79890 Hormone replacement therapy (postmenopausal): Secondary | ICD-10-CM

## 2019-02-07 DIAGNOSIS — I110 Hypertensive heart disease with heart failure: Secondary | ICD-10-CM | POA: Diagnosis present

## 2019-02-07 DIAGNOSIS — Z7984 Long term (current) use of oral hypoglycemic drugs: Secondary | ICD-10-CM

## 2019-02-07 DIAGNOSIS — E1151 Type 2 diabetes mellitus with diabetic peripheral angiopathy without gangrene: Secondary | ICD-10-CM | POA: Diagnosis present

## 2019-02-07 DIAGNOSIS — I509 Heart failure, unspecified: Secondary | ICD-10-CM | POA: Diagnosis present

## 2019-02-07 DIAGNOSIS — I248 Other forms of acute ischemic heart disease: Secondary | ICD-10-CM | POA: Diagnosis present

## 2019-02-07 DIAGNOSIS — L0390 Cellulitis, unspecified: Secondary | ICD-10-CM

## 2019-02-07 DIAGNOSIS — Y92009 Unspecified place in unspecified non-institutional (private) residence as the place of occurrence of the external cause: Secondary | ICD-10-CM

## 2019-02-07 DIAGNOSIS — Z833 Family history of diabetes mellitus: Secondary | ICD-10-CM

## 2019-02-07 DIAGNOSIS — W07XXXA Fall from chair, initial encounter: Secondary | ICD-10-CM | POA: Diagnosis present

## 2019-02-07 NOTE — ED Triage Notes (Signed)
Pt presents from home via [facility] with c/o mechanical fall. Pt states she slipped out of chair a few hours ago.Pt denies LOC or use of blood thinners. Pt c/o bilateral knee pain, pt able to bear weight on legs for ems. Pt blood sugar 215.

## 2019-02-07 NOTE — ED Provider Notes (Signed)
[facility] Emergency Department Provider Note  Time seen: 10:14 PM  I have reviewed the triage vital signs and the nursing notes.   HISTORY  Chief Complaint Fall   HPI [name] [name] is a 78 y.o. female with a past medical history of asthma, diabetes, hypertension, presents emergency department after a fall.  According to the patient she lives alone in her apartment.  Patient states she slipped out of her chair onto the ground and could not get herself back up so she called EMS.  She was complaining of the right knee pain and they recommended she come to the emergency department for evaluation.  Patient was able to get up with assistance and was able to bear weight on the legs per EMS.  Patient denies hitting her head.  Denies LOC.  Denies anticoagulants.  Patient denies any fever cough congestion or shortness of breath.   Past Medical History:  Diagnosis Date  . Allergic rhinitis   . Asthma   . 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 Diverticulitis   . Hypertensive heart disease with heart failure (HCC)   . Pain in right shoulder   . Primary open-angle glaucoma, bilateral, stage unspecified   . Thyroid disease    hypothroidism, unspecified  . Venous insufficiency (chronic) (peripheral)   . Vitamin D deficiency     Patient Active Problem List   Diagnosis Date Noted  . UTI (urinary tract infection) 12/10/2018  . Osteomyelitis (HCC) 03/13/2017  . Diabetic ulcer of toe of right foot associated with type 2 diabetes mellitus, limited to breakdown of skin (HCC)   . DNR (do not resuscitate)   . Goals of care, counseling/discussion   . Palliative care encounter   . Diabetic foot ulcer with osteomyelitis (HCC) 07/06/2015    Past Surgical History:  Procedure Laterality Date  . none      Prior to Admission medications   Medication Sig Start Date End Date Taking? Authorizing Provider   acetaminophen (TYLENOL) 325 MG tablet Take 650 mg by mouth every 6 (six) hours as needed for mild pain.    [provider]  albuterol (PROVENTIL HFA;VENTOLIN HFA) 108 (90 Base) MCG/ACT inhaler Inhale 2 puffs into the lungs every 4 (four) hours as needed for wheezing or shortness of breath.    [provider]  aspirin EC 81 MG tablet Take 81 mg by mouth daily.    [provider]  bimatoprost (LUMIGAN) 0.03 % ophthalmic solution Place 1 drop into both eyes at bedtime.     [provider]  brimonidine (ALPHAGAN) 0.15 % ophthalmic solution Place 1 drop into both eyes 3 (three) times daily.     [provider]  calamine lotion Apply 1 application topically 3 (three) times daily. 12/14/18   Altamese Dilling, MD  enalapril (VASOTEC) 20 MG tablet Take 20 mg by mouth daily.    [provider]  enoxaparin (LOVENOX) 40 MG/0.4ML injection Inject 0.4 mLs (40 mg total) into the skin daily for 7 days. 12/15/18 12/22/18  Altamese Dilling, MD  hydrocortisone (PROCTO-MED HC) 2.5 % rectal cream Place 1 application rectally every 4 (four) hours as needed for hemorrhoids or anal itching.    [provider]  levothyroxine (SYNTHROID, LEVOTHROID) 75 MCG tablet Take 75 mcg by mouth daily before breakfast.    [provider]  loperamide (IMODIUM) 2 MG capsule Take 2 mg by mouth as needed for diarrhea or  loose stools (max 8 capsules daily).     [provider]  metFORMIN (GLUCOPHAGE-XR) 500 MG 24 hr tablet Take 500 mg by mouth daily.    [provider]  oxyCODONE-acetaminophen (PERCOCET/ROXICET) 5-325 MG tablet Take 1 tablet by mouth every 6 (six) hours as needed for moderate pain or severe pain. 12/14/18   Altamese DillingVachhani, Vaibhavkumar, MD  senna-docusate (SENOKOT-S) 8.6-50 MG tablet Take 1 tablet by mouth 2 (two) times daily. 12/14/18   Altamese DillingVachhani, Vaibhavkumar, MD  timolol (BETIMOL) 0.5 % ophthalmic solution Place 1 drop into both eyes 2  (two) times daily.    [provider]    Allergies  Allergen Reactions  . Statins Other (See Comments)    Other reaction(s): UNKNOWN    Family History  Problem Relation Age of Onset  . Diabetes Mellitus II Mother     Social History Social History   Tobacco Use  . Smoking status: Never Smoker  . Smokeless tobacco: Never Used  Substance Use Topics  . Alcohol use: No  . Drug use: No    Review of Systems Constitutional: Negative for fever. Cardiovascular: Negative for chest pain. Respiratory: Negative for shortness of breath. Gastrointestinal: Negative for abdominal pain, vomiting  Musculoskeletal: Right knee pain. Skin: Negative for skin complaints  Neurological: Negative for headache All other ROS negative  ____________________________________________   PHYSICAL EXAM:  VITAL SIGNS: ED Triage Vitals  Enc Vitals Group     BP 02/07/19 2156 (!) 141/68     Pulse Rate 02/07/19 2156 (!) 114     Resp --      Temp 02/07/19 2156 98.7 F (37.1 C)     Temp Source 02/07/19 2156 Oral     SpO2 02/07/19 2156 100 %     Weight 02/07/19 2155 192 lb 7.4 oz (87.3 kg)     Height 02/07/19 2155 5\' 11"  (1.803 m)     Head Circumference --      Peak Flow --      Pain Score 02/07/19 2155 8     Pain Loc --      Pain Edu? --      Excl. in GC? --    Constitutional: Alert and oriented. Well appearing and in no distress. Eyes: Normal exam ENT      Head: Normocephalic and atraumatic.      Mouth/Throat: Mucous membranes are moist. Cardiovascular: Normal rate, regular rhythm.  Respiratory: Normal respiratory effort without tachypnea nor retractions. Breath sounds are clear  Gastrointestinal: Soft and nontender. No distention. Musculoskeletal: Moderate right knee pain with range of motion.  Mild tenderness to palpation.  Very minimal discomfort in the right hip with range of motion. Neurologic:  Normal speech and language. No gross focal neurologic deficits Skin:  Skin is warm,  dry and intact.  Psychiatric: Mood and affect are normal.   ____________________________________________   RADIOLOGY  X-rays pending  ____________________________________________   INITIAL IMPRESSION / ASSESSMENT AND PLAN / ED COURSE  Pertinent labs & imaging results that were available during my care of the patient were reviewed by me and considered in my medical decision making (see chart for details).   Patient presents emergency department after a fall.  Patient states she slid out of her chair at home.  Her only complains of right knee pain.  We will obtain an x-ray of the right knee as well as the pelvis as a precaution.  We will attempt to ambulate patient.  Denies hitting her head.  Denies LOC.  Has  no other complaints at this time.  X-rays are pending.  Patient care signed out to oncoming physician.  [name] [name] was evaluated in Emergency Department on 02/07/2019 for the symptoms described in the history of present illness. She was evaluated in the context of the global COVID-19 pandemic, which necessitated consideration that the patient might be at risk for infection with the SARS-CoV-2 virus that causes COVID-19. Institutional protocols and algorithms that pertain to the evaluation of patients at risk for COVID-19 are in a state of rapid change based on information released by regulatory bodies including the CDC and federal and state organizations. These policies and algorithms were followed during the patient's care in the ED.  ____________________________________________   FINAL CLINICAL IMPRESSION(S) / ED DIAGNOSES  Fall Right knee pain   Minna Antis, MD 02/07/19 2258

## 2019-02-08 DIAGNOSIS — E1151 Type 2 diabetes mellitus with diabetic peripheral angiopathy without gangrene: Secondary | ICD-10-CM | POA: Diagnosis present

## 2019-02-08 DIAGNOSIS — Z833 Family history of diabetes mellitus: Secondary | ICD-10-CM | POA: Diagnosis not present

## 2019-02-08 DIAGNOSIS — L03115 Cellulitis of right lower limb: Secondary | ICD-10-CM | POA: Diagnosis present

## 2019-02-08 DIAGNOSIS — Z7982 Long term (current) use of aspirin: Secondary | ICD-10-CM | POA: Diagnosis not present

## 2019-02-08 DIAGNOSIS — L03116 Cellulitis of left lower limb: Secondary | ICD-10-CM | POA: Diagnosis present

## 2019-02-08 DIAGNOSIS — W07XXXA Fall from chair, initial encounter: Secondary | ICD-10-CM | POA: Diagnosis present

## 2019-02-08 DIAGNOSIS — Y92009 Unspecified place in unspecified non-institutional (private) residence as the place of occurrence of the external cause: Secondary | ICD-10-CM | POA: Diagnosis not present

## 2019-02-08 DIAGNOSIS — I248 Other forms of acute ischemic heart disease: Secondary | ICD-10-CM | POA: Diagnosis present

## 2019-02-08 DIAGNOSIS — Z79890 Hormone replacement therapy (postmenopausal): Secondary | ICD-10-CM | POA: Diagnosis not present

## 2019-02-08 DIAGNOSIS — I509 Heart failure, unspecified: Secondary | ICD-10-CM | POA: Diagnosis present

## 2019-02-08 DIAGNOSIS — Z1159 Encounter for screening for other viral diseases: Secondary | ICD-10-CM | POA: Diagnosis not present

## 2019-02-08 DIAGNOSIS — E039 Hypothyroidism, unspecified: Secondary | ICD-10-CM | POA: Diagnosis present

## 2019-02-08 DIAGNOSIS — J45909 Unspecified asthma, uncomplicated: Secondary | ICD-10-CM | POA: Diagnosis present

## 2019-02-08 DIAGNOSIS — Z7984 Long term (current) use of oral hypoglycemic drugs: Secondary | ICD-10-CM | POA: Diagnosis not present

## 2019-02-08 DIAGNOSIS — L0390 Cellulitis, unspecified: Secondary | ICD-10-CM

## 2019-02-08 DIAGNOSIS — I110 Hypertensive heart disease with heart failure: Secondary | ICD-10-CM | POA: Diagnosis present

## 2019-02-08 LAB — COMPREHENSIVE METABOLIC PANEL
ALT: 11 U/L (ref 0–44)
ALT: 11 U/L (ref 0–44)
AST: 19 U/L (ref 15–41)
AST: 19 U/L (ref 15–41)
Albumin: 3.8 g/dL (ref 3.5–5.0)
Albumin: 3.4 g/dL — ABNORMAL LOW (ref 3.5–5.0)
Alkaline Phosphatase: 81 U/L (ref 38–126)
Alkaline Phosphatase: 72 U/L (ref 38–126)
Anion gap: 9 (ref 5–15)
Anion gap: 11 (ref 5–15)
BUN: 14 mg/dL (ref 8–23)
BUN: 10 mg/dL (ref 8–23)
CO2: 25 mmol/L (ref 22–32)
CO2: 23 mmol/L (ref 22–32)
Calcium: 8.8 mg/dL — ABNORMAL LOW (ref 8.9–10.3)
Calcium: 8.4 mg/dL — ABNORMAL LOW (ref 8.9–10.3)
Chloride: 102 mmol/L (ref 98–111)
Chloride: 100 mmol/L (ref 98–111)
Creatinine, Ser: 0.61 mg/dL (ref 0.44–1.00)
Creatinine, Ser: 0.6 mg/dL (ref 0.44–1.00)
GFR calc Af Amer: >60 mL/min (ref >60)
GFR calc Af Amer: >60 mL/min (ref >60)
GFR calc non Af Amer: >60 mL/min (ref >60)
GFR calc non Af Amer: >60 mL/min (ref >60)
Glucose, Bld: 212 mg/dL — ABNORMAL HIGH (ref 70–99)
Glucose, Bld: 155 mg/dL — ABNORMAL HIGH (ref 70–99)
Potassium: 3.9 mmol/L (ref 3.5–5.1)
Potassium: 4.4 mmol/L (ref 3.5–5.1)
Sodium: 136 mmol/L (ref 135–145)
Sodium: 134 mmol/L — ABNORMAL LOW (ref 135–145)
Total Bilirubin: 1.2 mg/dL (ref 0.3–1.2)
Total Bilirubin: 1.1 mg/dL (ref 0.3–1.2)
Total Protein: 7.1 g/dL (ref 6.5–8.1)
Total Protein: 6.4 g/dL — ABNORMAL LOW (ref 6.5–8.1)

## 2019-02-08 LAB — CBC WITH DIFFERENTIAL/PLATELET
Abs Immature Granulocytes: 0.1 10*3/uL — ABNORMAL HIGH (ref 0.00–0.07)
Basophils Absolute: 0 10*3/uL (ref 0.0–0.1)
Basophils Relative: 0 %
Eosinophils Absolute: 0 10*3/uL (ref 0.0–0.5)
Eosinophils Relative: 0 %
HCT: 34.9 % — ABNORMAL LOW (ref 36.0–46.0)
Hemoglobin: 10.8 g/dL — ABNORMAL LOW (ref 12.0–15.0)
Immature Granulocytes: 1 %
Lymphocytes Relative: 8 %
Lymphs Abs: 0.9 10*3/uL (ref 0.7–4.0)
MCH: 26.3 pg (ref 26.0–34.0)
MCHC: 30.9 g/dL (ref 30.0–36.0)
MCV: 84.9 fL (ref 80.0–100.0)
Monocytes Absolute: 0.9 10*3/uL (ref 0.1–1.0)
Monocytes Relative: 8 %
Neutro Abs: 9.4 10*3/uL — ABNORMAL HIGH (ref 1.7–7.7)
Neutrophils Relative %: 83 %
Platelets: 258 10*3/uL (ref 150–400)
RBC: 4.11 MIL/uL (ref 3.87–5.11)
RDW: 14.6 % (ref 11.5–15.5)
WBC: 11.5 10*3/uL — ABNORMAL HIGH (ref 4.0–10.5)
nRBC: 0 % (ref 0.0–0.2)

## 2019-02-08 LAB — URINALYSIS, COMPLETE (UACMP) WITH MICROSCOPIC
Bilirubin Urine: NEGATIVE
Glucose, UA: NEGATIVE mg/dL
Ketones, ur: NEGATIVE mg/dL
Leukocytes,Ua: NEGATIVE
Nitrite: POSITIVE — AB
Protein, ur: NEGATIVE mg/dL
Specific Gravity, Urine: 1.019 (ref 1.005–1.030)
pH: 5 (ref 5.0–8.0)

## 2019-02-08 LAB — SARS CORONAVIRUS 2 BY RT PCR (HOSPITAL ORDER, PERFORMED IN ~~LOC~~ HOSPITAL LAB): SARS Coronavirus 2: NEGATIVE

## 2019-02-08 LAB — LACTIC ACID, PLASMA
Lactic Acid, Venous: 1.7 mmol/L (ref 0.5–1.9)
Lactic Acid, Venous: 1.5 mmol/L (ref 0.5–1.9)

## 2019-02-08 LAB — TROPONIN I
Troponin I: 0.04 ng/mL (ref <0.03)
Troponin I: 0.03 ng/mL (ref <0.03)
Troponin I: <0.03 ng/mL (ref <0.03)

## 2019-02-08 LAB — CBC
HCT: 35.8 % — ABNORMAL LOW (ref 36.0–46.0)
Hemoglobin: 10.9 g/dL — ABNORMAL LOW (ref 12.0–15.0)
MCH: 26.3 pg (ref 26.0–34.0)
MCHC: 30.4 g/dL (ref 30.0–36.0)
MCV: 86.5 fL (ref 80.0–100.0)
Platelets: 288 10*3/uL (ref 150–400)
RBC: 4.14 MIL/uL (ref 3.87–5.11)
RDW: 14.6 % (ref 11.5–15.5)
WBC: 10.2 10*3/uL (ref 4.0–10.5)
nRBC: 0 % (ref 0.0–0.2)

## 2019-02-08 LAB — GLUCOSE, CAPILLARY
Glucose-Capillary: 140 mg/dL — ABNORMAL HIGH (ref 70–99)
Glucose-Capillary: 145 mg/dL — ABNORMAL HIGH (ref 70–99)
Glucose-Capillary: 138 mg/dL — ABNORMAL HIGH (ref 70–99)
Glucose-Capillary: 150 mg/dL — ABNORMAL HIGH (ref 70–99)

## 2019-02-08 LAB — HEMOGLOBIN A1C
Hgb A1c MFr Bld: 7.2 % — ABNORMAL HIGH (ref 4.8–5.6)
Mean Plasma Glucose: 159.94 mg/dL

## 2019-02-08 LAB — BRAIN NATRIURETIC PEPTIDE: B Natriuretic Peptide: 28 pg/mL (ref 0.0–100.0)

## 2019-02-08 LAB — PROTIME-INR
INR: 1.1 (ref 0.8–1.2)
Prothrombin Time: 13.7 seconds (ref 11.4–15.2)

## 2019-02-08 LAB — TSH: TSH: 4.257 u[IU]/mL (ref 0.350–4.500)

## 2019-02-08 MED ORDER — SODIUM CHLORIDE 0.9 % IV SOLN
2.0000 g | Freq: Every day | INTRAVENOUS | Status: DC
  Administered 2019-02-08: 07:00:00 2 g via INTRAVENOUS
  Filled 2019-02-08: qty 2

## 2019-02-08 MED ORDER — NITROFURANTOIN MONOHYD MACRO 100 MG PO CAPS
100.0000 mg | ORAL_CAPSULE | Freq: Two times a day (BID) | ORAL | Status: DC
  Filled 2019-02-08 (×2): qty 1

## 2019-02-08 MED ORDER — ACETAMINOPHEN 325 MG PO TABS: 650.0000 mg | ORAL_TABLET | Freq: Four times a day (QID) | ORAL | Status: DC | PRN

## 2019-02-08 MED ORDER — ASPIRIN EC 81 MG PO TBEC
81.0000 mg | DELAYED_RELEASE_TABLET | Freq: Every day | ORAL | Status: DC
  Administered 2019-02-08 – 2019-02-10 (×3): 81 mg via ORAL
  Filled 2019-02-08 (×3): qty 1

## 2019-02-08 MED ORDER — ONDANSETRON HCL 4 MG PO TABS: 4.0000 mg | ORAL_TABLET | Freq: Four times a day (QID) | ORAL | Status: DC | PRN

## 2019-02-08 MED ORDER — METFORMIN HCL ER 500 MG PO TB24
500.0000 mg | ORAL_TABLET | Freq: Every day | ORAL | Status: DC
  Administered 2019-02-08 – 2019-02-10 (×3): 500 mg via ORAL
  Filled 2019-02-08 (×3): qty 1

## 2019-02-08 MED ORDER — VITAMIN B-1 100 MG PO TABS
100.0000 mg | ORAL_TABLET | Freq: Every day | ORAL | Status: DC
  Administered 2019-02-08 – 2019-02-10 (×3): 100 mg via ORAL
  Filled 2019-02-08 (×3): qty 1

## 2019-02-08 MED ORDER — BRIMONIDINE TARTRATE 0.15 % OP SOLN
1.0000 [drp] | Freq: Three times a day (TID) | OPHTHALMIC | Status: DC
  Administered 2019-02-08 – 2019-02-10 (×6): 1 [drp] via OPHTHALMIC
  Filled 2019-02-08: qty 5

## 2019-02-08 MED ORDER — INSULIN ASPART 100 UNIT/ML ~~LOC~~ SOLN: 0.0000 [IU] | Freq: Every day | SUBCUTANEOUS | Status: DC

## 2019-02-08 MED ORDER — OXYCODONE-ACETAMINOPHEN 5-325 MG PO TABS
1.0000 | ORAL_TABLET | Freq: Four times a day (QID) | ORAL | Status: DC | PRN
  Administered 2019-02-08: 14:00:00 1 via ORAL
  Filled 2019-02-08: qty 1

## 2019-02-08 MED ORDER — INSULIN ASPART 100 UNIT/ML ~~LOC~~ SOLN
0.0000 [IU] | Freq: Three times a day (TID) | SUBCUTANEOUS | Status: DC
  Administered 2019-02-08 – 2019-02-10 (×5): 2 [IU] via SUBCUTANEOUS
  Filled 2019-02-08 (×5): qty 1

## 2019-02-08 MED ORDER — ENOXAPARIN SODIUM 40 MG/0.4ML ~~LOC~~ SOLN
40.0000 mg | SUBCUTANEOUS | Status: DC
  Administered 2019-02-08 – 2019-02-10 (×3): 40 mg via SUBCUTANEOUS
  Filled 2019-02-08 (×3): qty 0.4

## 2019-02-08 MED ORDER — SODIUM CHLORIDE 0.9 % IV BOLUS
500.0000 mL | Freq: Once | INTRAVENOUS | Status: AC
  Administered 2019-02-08: 500 mL via INTRAVENOUS

## 2019-02-08 MED ORDER — TIMOLOL MALEATE 0.5 % OP SOLN
1.0000 [drp] | Freq: Two times a day (BID) | OPHTHALMIC | Status: DC
  Administered 2019-02-08 – 2019-02-10 (×5): 1 [drp] via OPHTHALMIC
  Filled 2019-02-08: qty 5

## 2019-02-08 MED ORDER — FOSFOMYCIN TROMETHAMINE 3 G PO PACK
3.0000 g | PACK | Freq: Once | ORAL | Status: AC
  Administered 2019-02-08: 13:00:00 3 g via ORAL
  Filled 2019-02-08: qty 3

## 2019-02-08 MED ORDER — LATANOPROST 0.005 % OP SOLN
1.0000 [drp] | Freq: Every day | OPHTHALMIC | Status: DC
  Administered 2019-02-08 – 2019-02-09 (×2): 1 [drp] via OPHTHALMIC
  Filled 2019-02-08: qty 2.5

## 2019-02-08 MED ORDER — SODIUM CHLORIDE 0.9 % IV SOLN
3.0000 g | Freq: Once | INTRAVENOUS | Status: AC
  Administered 2019-02-08: 03:00:00 3 g via INTRAVENOUS
  Filled 2019-02-08: qty 3

## 2019-02-08 MED ORDER — IPRATROPIUM-ALBUTEROL 0.5-2.5 (3) MG/3ML IN SOLN: 3.0000 mL | Freq: Four times a day (QID) | RESPIRATORY_TRACT | Status: DC | PRN

## 2019-02-08 MED ORDER — FOLIC ACID 1 MG PO TABS
1.0000 mg | ORAL_TABLET | Freq: Every day | ORAL | Status: DC
  Administered 2019-02-08 – 2019-02-10 (×3): 1 mg via ORAL
  Filled 2019-02-08 (×3): qty 1

## 2019-02-08 MED ORDER — TIMOLOL HEMIHYDRATE 0.5 % OP SOLN: 1.0000 [drp] | Freq: Two times a day (BID) | OPHTHALMIC | Status: DC

## 2019-02-08 MED ORDER — ADULT MULTIVITAMIN W/MINERALS CH
1.0000 | ORAL_TABLET | Freq: Every day | ORAL | Status: DC
  Administered 2019-02-08 – 2019-02-10 (×3): 1 via ORAL
  Filled 2019-02-08 (×3): qty 1

## 2019-02-08 MED ORDER — ONDANSETRON HCL 4 MG/2ML IJ SOLN: 4.0000 mg | Freq: Four times a day (QID) | INTRAMUSCULAR | Status: DC | PRN

## 2019-02-08 MED ORDER — LEVOTHYROXINE SODIUM 50 MCG PO TABS
75.0000 ug | ORAL_TABLET | Freq: Every day | ORAL | Status: DC
  Administered 2019-02-08 – 2019-02-10 (×3): 75 ug via ORAL
  Filled 2019-02-08 (×3): qty 1

## 2019-02-08 MED ORDER — ENALAPRIL MALEATE 20 MG PO TABS
20.0000 mg | ORAL_TABLET | Freq: Every day | ORAL | Status: DC
  Administered 2019-02-08 – 2019-02-10 (×3): 20 mg via ORAL
  Filled 2019-02-08 (×3): qty 1

## 2019-02-08 NOTE — Evaluation (Signed)
Physical Therapy Evaluation Patient Details Name: [name] [name] MRN: [id number] DOB: 01/13/1941 Today's Date: 02/08/2019   History of Present Illness  Pt is a 78 y.o. female presenting to hospital 02/07/19 with mechanical fall (slipped out of chair) and c/o R knee pain (able to bear weight).  Imaging of R hip and R knee negative for acute fx.  Pt admitted with B LE cellulitis, generalized weakness, DM, and htn.  PMH includes asthma, DM, htn, R shoulder pain, osteomyelitis, diabetic R toe ulcer, wound R posterior leg.  Clinical Impression  Prior to hospital admission, pt reports being ambulatory household distances with 4ww; has home health aides morning and evening 7 days a week; part of the [facility] program; and has MOW on Wednesdays.  Pt lives alone in 1 level apt (level entry).  Currently pt is unable to stand fully upright with 1 assist (and walker use) and fatigued quickly with activity.  Pt denied any pain during session (nurse reports pt had R LE pain earlier but was pre-medicated with pain meds for session's activities).  Pt appeared motivated to demonstrate that she could get to the chair so she could go home but pt tried multiple times on own (and with therapists assist) but unable to get to the chair d/t overall weakness and fatigue (even attempted transfer with chair armrest lowered for lateral scoot).  Pt would benefit from skilled PT to address noted impairments and functional limitations (see below for any additional details).  Upon hospital discharge, recommend pt discharge to STR.    Follow Up Recommendations SNF    Equipment Recommendations  Wheelchair (measurements PT);Wheelchair cushion (measurements PT)    Recommendations for Other Services OT consult     Precautions / Restrictions Precautions Precautions: Fall Precaution Comments: Wound posterior R leg Restrictions Weight Bearing Restrictions: No      Mobility  Bed Mobility Overal bed mobility: Needs Assistance Bed  Mobility: Supine to Sit;Sit to Supine     Supine to sit: Mod assist;Max assist Sit to supine: +2 for physical assistance   General bed mobility comments: assist for trunk and B LE's; 2 assist sit to supine and to boost up in bed  Transfers Overall transfer level: Needs assistance Equipment used: Rolling walker (2 wheeled) Transfers: Sit to/from Stand Sit to Stand: Total assist         General transfer comment: able to come to almost full stand 1st trial standing with min assist but pt unable to transition UE's to walker to fully come to upright with max assist x1; 2nd trial pt able to come to 1/2 stand with max assist x1; attempted to stand 3 more trials but unable to clear pt's bottom from bed with 1 assist (pt appeared to fatigue); attempted lateral scoot but pt unable with 1 assist (pt appeared too fatigued)  Ambulation/Gait             General Gait Details: unable at this time  Stairs            Wheelchair Mobility    Modified Rankin (Stroke Patients Only)       Balance Overall balance assessment: Needs assistance Sitting-balance support: No upper extremity supported;Feet supported Sitting balance-Leahy Scale: Good Sitting balance - Comments: steady sitting reaching within BOS                                     Pertinent Vitals/Pain Pain Assessment: No/denies pain  HR 98 bpm at rest and increased up to 123 bpm with activity.    Home Living Family/patient expects to be discharged to:: Private residence Living Arrangements: Alone Available Help at Discharge: (PACE) Type of Home: Apartment Home Access: Level entry     Home Layout: One level Home Equipment: Emergency planning/management officerhower seat;Walker - 4 wheels;Grab bars - tub/shower;Grab bars - toilet      Prior Function Level of Independence: Needs assistance   Gait / Transfers Assistance Needed: Ambulates with 4ww  ADL's / Homemaking Assistance Needed: Home health aid morning and evening 7 days a week.   Receives MOW on Wednesdays.  Comments: PACE participant     Hand Dominance        Extremity/Trunk Assessment   Upper Extremity Assessment Upper Extremity Assessment: Generalized weakness    Lower Extremity Assessment Lower Extremity Assessment: RLE deficits/detail;LLE deficits/detail RLE Deficits / Details: hip flexion 2+/5; knee flexion/extension 3-/5; DF at least 3/5 AROM LLE Deficits / Details: at least 3/5 hip flexion, knee flexion/extension, and DF    Cervical / Trunk Assessment Cervical / Trunk Assessment: Normal  Communication   Communication: No difficulties  Cognition Arousal/Alertness: Awake/alert Behavior During Therapy: Flat affect Overall Cognitive Status: No family/caregiver present to determine baseline cognitive functioning                                 General Comments: Oriented to person, place, time, and situation.  Increased time to problem solve and verbalize at times noted.      General Comments General comments (skin integrity, edema, etc.): wound noted R posterior leg.  Nursing cleared pt for participation in physical therapy.  Pt agreeable to PT session.    Exercises  Transfer training   Assessment/Plan    PT Assessment Patient needs continued PT services  PT Problem List Decreased strength;Decreased activity tolerance;Decreased balance;Decreased mobility;Decreased knowledge of use of DME;Pain;Decreased skin integrity       PT Treatment Interventions DME instruction;Gait training;Functional mobility training;Therapeutic activities;Therapeutic exercise;Balance training;Patient/family education    PT Goals (Current goals can be found in the Care Plan section)  Acute Rehab PT Goals Patient Stated Goal: to go home PT Goal Formulation: With patient Time For Goal Achievement: 02/22/19 Potential to Achieve Goals: Fair    Frequency Min 2X/week   Barriers to discharge Decreased caregiver support      Co-evaluation                AM-PAC PT "6 Clicks" Mobility  Outcome Measure Help needed turning from your back to your side while in a flat bed without using bedrails?: A Little Help needed moving from lying on your back to sitting on the side of a flat bed without using bedrails?: A Lot Help needed moving to and from a bed to a chair (including a wheelchair)?: Total Help needed standing up from a chair using your arms (e.g., wheelchair or bedside chair)?: Total Help needed to walk in hospital room?: Total Help needed climbing 3-5 steps with a railing? : Total 6 Click Score: 9    End of Session Equipment Utilized During Treatment: Gait belt Activity Tolerance: Patient limited by fatigue Patient left: in bed;with call bell/phone within reach;with bed alarm set;Other (comment)(bed in lowest position) Nurse Communication: Mobility status;Precautions PT Visit Diagnosis: Other abnormalities of gait and mobility (R26.89);Muscle weakness (generalized) (M62.81);History of falling (Z91.81);Difficulty in walking, not elsewhere classified (R26.2);Pain Pain - Right/Left: Right Pain - part of  body: Leg    Time: 1610-9604 PT Time Calculation (min) (ACUTE ONLY): 35 min   Charges:   PT Evaluation $PT Eval Low Complexity: 1 Low PT Treatments $Therapeutic Activity: 8-22 mins       Hendricks Limes, PT 02/08/19, 4:34 PM [phone]

## 2019-02-08 NOTE — Progress Notes (Signed)
Family Meeting Note  Advance Directive:yes  Today a meeting took place with the Patient.  Patient is able to participate.  The following clinical team members were present during this meeting:MD  The following were discussed:Patient's diagnosis: generalized weakness, Patient's progosis: Unable to determine and Goals for treatment: DNI  Additional follow-up to be provided: prn  Time spent during discussion:20 minutes  Hilton Sinclair, MD

## 2019-02-08 NOTE — Consult Note (Signed)
WOC Nurse wound consult note Consultation was completed by review of records, images and assistance from the bedside nurse/clinical staff.   Reason for Consult: BLE wounds Patient with known venous stasis. Unclear if she has been followed recently for any care for this.  Wound type: full thickness wound right posterior calf Pressure Injury POA:NA Measurement: 3cm x 2cm x 0.2cm  Wound bed: pink, non granular, dry Drainage (amount, consistency, odor) minimal serous noted in provider note and  nursing flow sheet  Periwound: venous stasis skin changes; hyperkeratotic skin changes associated with long term venous stasis dx.   Dressing  Procedure/placement/frequency: Would recommend long term compression stockings for this patient however she will need to initially topical wound care. I have added hydrogel to create a moist wound bed. She would benefit from compression but if it is decided to use this therapy would recommend it be initiated in the wound care center of her choice in order to obtain needed studies of her blood flow and eventually fit her for long term stockings.   Discussed POC with patient and bedside nurse.  Re consult if needed, will not follow at this time. Thanks  Blanche Scovell [employer], RN,CWOCN, CNS, CWON-AP [phone])

## 2019-02-08 NOTE — [place] FL2 (Signed)
[place] MEDICAID FL2 LEVEL OF CARE SCREENING TOOL     IDENTIFICATION  Patient Name: [name] [name] Birthdate: [birth year] Sex: female Admission Date (Current Location): 02/07/2019  [place] and [place] Number:  Chiropodist and Address:  [facility], [address], [place], [place] [address]      Provider Number: [id number]  Attending Physician Name and Address:  Altamese Dilling, *  Relative Name and Phone Number:       Current Level of Care: Hospital Recommended Level of Care: Skilled Nursing Facility Prior Approval Number:    Date Approved/Denied:   PASRR Number: [id number]  Discharge Plan: SNF    Current Diagnoses: Patient Active Problem List   Diagnosis Date Noted  . Cellulitis 02/08/2019  . UTI (urinary tract infection) 12/10/2018  . Osteomyelitis (HCC) 03/13/2017  . Diabetic ulcer of toe of right foot associated with type 2 diabetes mellitus, limited to breakdown of skin (HCC)   . DNR (do not resuscitate)   . Goals of care, counseling/discussion   . Palliative care encounter   . Diabetic foot ulcer with osteomyelitis (HCC) 07/06/2015    Orientation RESPIRATION BLADDER Height & Weight     Self, Time, Place  Normal Continent Weight: 192 lb 7.4 oz (87.3 kg) Height:  5\' 11"  (180.3 cm)  BEHAVIORAL SYMPTOMS/MOOD NEUROLOGICAL BOWEL NUTRITION STATUS  (none) (none) Continent Diet(Heart Healthy )  AMBULATORY STATUS COMMUNICATION OF NEEDS Skin   Extensive Assist Verbally Other (Comment)(Non pressure leg wound )                       Personal Care Assistance Level of Assistance  Bathing, Feeding, Dressing Bathing Assistance: Limited assistance Feeding assistance: Independent Dressing Assistance: Limited assistance     Functional Limitations Info  Sight, Hearing, Speech Sight Info: Adequate Hearing Info: Adequate Speech Info: Adequate    SPECIAL CARE FACTORS FREQUENCY  PT (By licensed PT), OT (By licensed OT)      PT Frequency: 5 OT Frequency: 5            Contractures Contractures Info: Not present    Additional Factors Info  Code Status, Allergies Code Status Info: Partial Code  Allergies Info: Statins            Current Medications (02/08/2019):  This is the current hospital active medication list Current Facility-Administered Medications  Medication Dose Route Frequency Provider Last Rate Last Dose  . acetaminophen (TYLENOL) tablet 650 mg  650 mg Oral Q6H PRN Altamese Dilling, MD      . aspirin EC tablet 81 mg  81 mg Oral Daily Pearletha Alfred, NP   81 mg at 02/08/19 0817  . brimonidine (ALPHAGAN) 0.15 % ophthalmic solution 1 drop  1 drop Both Eyes TID Janeann Merl H, NP   1 drop at 02/08/19 1735  . enalapril (VASOTEC) tablet 20 mg  20 mg Oral Daily Seals, Milas Kocher, NP   20 mg at 02/08/19 0817  . enoxaparin (LOVENOX) injection 40 mg  40 mg Subcutaneous Q24H Seals, Marylene Land H, NP   40 mg at 02/08/19 0816  . folic acid (FOLVITE) tablet 1 mg  1 mg Oral Daily Seals, Milas Kocher, NP   1 mg at 02/08/19 0817  . insulin aspart (novoLOG) injection 0-15 Units  0-15 Units Subcutaneous TID WC Seals, Milas Kocher, NP   2 Units at 02/08/19 1735  . insulin aspart (novoLOG) injection 0-5 Units  0-5 Units Subcutaneous QHS Seals, Milas Kocher, NP      .  ipratropium-albuterol (DUONEB) 0.5-2.5 (3) MG/3ML nebulizer solution 3 mL  3 mL Nebulization Q6H PRN Seals, Angela H, NP      . latanoprost (XALATAN) 0.005 % ophthalmic solution 1 drop  1 drop Both Eyes QHS Seals, Angela H, NP      . levothyroxine (SYNTHROID) tablet 75 mcg  75 mcg Oral QAC breakfast Janeann MerlSeals, Angela H, NP   75 mcg at 02/08/19 0535  . metFORMIN (GLUCOPHAGE-XR) 24 hr tablet 500 mg  500 mg Oral Daily Seals, Angela H, NP   500 mg at 02/08/19 [id number]  . multivitamin with minerals tablet 1 tablet  1 tablet Oral Daily Seals, Milas KocherAngela H, NP   1 tablet at 02/08/19 0816  . ondansetron (ZOFRAN) tablet 4 mg  4 mg Oral Q6H PRN Seals, Milas KocherAngela H, NP       Or  .  ondansetron (ZOFRAN) injection 4 mg  4 mg Intravenous Q6H PRN Seals, Milas KocherAngela H, NP      . oxyCODONE-acetaminophen (PERCOCET/ROXICET) 5-325 MG per tablet 1 tablet  1 tablet Oral Q6H PRN Altamese DillingVachhani, Vaibhavkumar, MD   1 tablet at 02/08/19 1338  . thiamine (VITAMIN B-1) tablet 100 mg  100 mg Oral Daily Seals, Angela H, NP   100 mg at 02/08/19 0816  . timolol (TIMOPTIC) 0.5 % ophthalmic solution 1 drop  1 drop Both Eyes BID Seals, Milas KocherAngela H, NP   1 drop at 02/08/19 [id number]     Discharge Medications: Please see discharge summary for a list of discharge medications.  Relevant Imaging Results:  Relevant Lab Results:   Additional Information SSN: [id number]  Ruthe MannanCandace  Kema [place], [place]

## 2019-02-08 NOTE — H&P (Addendum)
[facility] [place] [facility]   PATIENT NAME: [name] SpittleMelissa [name]    MR#:  [id number]  DATE OF BIRTH:  May 04, 1941  DATE OF ADMISSION:  02/07/2019  PRIMARY CARE PHYSICIAN: Patient, No Pcp Per   REQUESTING/REFERRING PHYSICIAN: Dorothea GlassmanPaul Malinda, MD  CHIEF COMPLAINT:   Chief Complaint  Patient presents with  . Fall    HISTORY OF PRESENT ILLNESS:  [name] SpittleMelissa Mcmichael  is a 78 y.o. female with a known history of asthma, diabetes mellitus, peripheral venous insufficiency, glaucoma, hypothyroidism.  Patient was brought to the emergency room via EMS services reporting she slipped out of her chair onto the ground and was unable to get back up.  Patient lives alone.  She usually ambulates with assistance of a walker.  She denies hitting her head or loss of consciousness.  Patient is on no anticoagulant therapy.  She denies chest pain or shortness of breath increased.  She denies cough.  She denies recent illness.  She denies fevers, chills, nausea, vomiting, diarrhea.  She denies abdominal pain.  Patient has a history of bilateral lower extremity venous insufficiency and cellulitis with nonhealing wounds in the past according to documentation in 2016 patient was seeing Dr. Alberteen Spindleline.  She has noticed increased erythema and edema bilateral lower extremities with a draining wound on the medial aspect of her right lower extremity.  She denies significant pain of her lower extremities related to this.  Labs were reviewed with BNP of 28.  Opponent is 0.04.  WBC is 11.5.  Right hip CT demonstrated no evidence of fracture or dislocation.  Chest x-ray demonstrated no acute pulmonary disease.  Rapid COVID-19 testing is negative We have admitted her to the hospitalist service for further management.  PAST MEDICAL HISTORY:   Past Medical History:  Diagnosis Date  . Allergic rhinitis   . Asthma   . 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 Diverticulitis   . Hypertensive heart disease with heart failure (HCC)   . Pain in right shoulder   . Primary open-angle glaucoma, bilateral, stage unspecified   . Thyroid disease    hypothroidism, unspecified  . Venous insufficiency (chronic) (peripheral)   . Vitamin D deficiency     PAST SURGICAL HISTORY:   Past Surgical History:  Procedure Laterality Date  . none      SOCIAL HISTORY:   Social History   Tobacco Use  . Smoking status: Never Smoker  . Smokeless tobacco: Never Used  Substance Use Topics  . Alcohol use: No    FAMILY HISTORY:   Family History  Problem Relation Age of Onset  . Diabetes Mellitus II Mother     DRUG ALLERGIES:   Allergies  Allergen Reactions  . Statins Other (See Comments)    Other reaction(s): UNKNOWN    REVIEW OF SYSTEMS:   Review of Systems  Constitutional: Positive for malaise/fatigue. Negative for chills, fever and weight loss.  HENT: Negative for congestion, sinus pain and sore throat.   Eyes: Negative for blurred vision, double vision and pain.  Respiratory: Negative for cough, sputum production and shortness of breath.   Cardiovascular: Positive for leg swelling. Negative for chest pain, palpitations and orthopnea.  Gastrointestinal: Negative for abdominal pain, blood in stool, constipation, melena, nausea and vomiting.  Genitourinary: Negative for dysuria, frequency and hematuria.  Musculoskeletal: Positive for falls and myalgias. Negative for joint pain.  Skin: Negative for itching  and rash.  Neurological: Positive for weakness. Negative for dizziness, loss of consciousness and headaches.  Psychiatric/Behavioral: Negative.       MEDICATIONS AT HOME:   Prior to Admission medications   Medication Sig Start Date End Date Taking? Authorizing Provider  acetaminophen (TYLENOL) 325 MG tablet Take 650 mg by mouth every 6 (six) hours as needed for mild pain.   Yes [provider]  albuterol (PROVENTIL  HFA;VENTOLIN HFA) 108 (90 Base) MCG/ACT inhaler Inhale 2 puffs into the lungs every 4 (four) hours as needed for wheezing or shortness of breath.   Yes [provider]  loperamide (IMODIUM) 2 MG capsule Take 2 mg by mouth as needed for diarrhea or loose stools (max 8 capsules daily).    Yes [provider]  aspirin EC 81 MG tablet Take 81 mg by mouth daily.    [provider]  bimatoprost (LUMIGAN) 0.03 % ophthalmic solution Place 1 drop into both eyes at bedtime.     [provider]  brimonidine (ALPHAGAN) 0.15 % ophthalmic solution Place 1 drop into both eyes 3 (three) times daily.     [provider]  enalapril (VASOTEC) 20 MG tablet Take 20 mg by mouth daily.    [provider]  hydrocortisone (PROCTO-MED HC) 2.5 % rectal cream Place 1 application rectally every 4 (four) hours as needed for hemorrhoids or anal itching.    [provider]  levothyroxine (SYNTHROID, LEVOTHROID) 75 MCG tablet Take 75 mcg by mouth daily before breakfast.    [provider]  metFORMIN (GLUCOPHAGE-XR) 500 MG 24 hr tablet Take 500 mg by mouth daily.    [provider]  timolol (BETIMOL) 0.5 % ophthalmic solution Place 1 drop into both eyes 2 (two) times daily.    [provider]      VITAL SIGNS:  Blood pressure (!) 171/72, pulse 97, temperature 98.7 F (37.1 C), temperature source Oral, resp. rate 20, height 5\' 11"  (1.803 m), weight 87.3 kg, SpO2 96 %.  PHYSICAL EXAMINATION:  Physical Exam Vitals signs and nursing note reviewed.  Constitutional:      General: She is not in acute distress.    Appearance: She is obese. She is ill-appearing.  HENT:     Head: Normocephalic.     Right Ear: External ear normal.     Left Ear: External ear normal.     Nose: Nose normal.     Mouth/Throat:     Mouth: Mucous membranes are moist.     Pharynx: Oropharynx is clear.  Eyes:     General: No scleral icterus.    Extraocular  Movements: Extraocular movements intact.     Pupils: Pupils are equal, round, and reactive to light.  Neck:     Musculoskeletal: Normal range of motion and neck supple.  Cardiovascular:     Rate and Rhythm: Normal rate and regular rhythm.     Heart sounds: Normal heart sounds. No murmur. No friction rub. No gallop.   Pulmonary:     Effort: Pulmonary effort is normal. No respiratory distress.     Breath sounds: Normal breath sounds. No wheezing, rhonchi or rales.  Abdominal:     General: Bowel sounds are normal. There is no distension.     Palpations: Abdomen is soft. There is no mass.     Tenderness: There is no abdominal tenderness.  Musculoskeletal:        General: Swelling and tenderness present.     Right lower leg: Edema  present.     Left lower leg: Edema present.     Comments: ROM restricted by obese body habitus  Skin:    General: Skin is warm.     Capillary Refill: Capillary refill takes less than 2 seconds.     Findings: Erythema present. No rash.     Comments: Brawny edema to bilateral lower extremities below the knee -open ulceration on right medial lower extremity  Erythema to bilateral lower ext and feet  Neurological:     Mental Status: She is alert and oriented to person, place, and time.     Motor: Weakness present.  Psychiatric:        Mood and Affect: Mood normal.       LABORATORY PANEL:   CBC Recent Labs  Lab 02/08/19 0117  WBC 11.5*  HGB 10.8*  HCT 34.9*  PLT 258   ------------------------------------------------------------------------------------------------------------------  Chemistries  Recent Labs  Lab 02/08/19 0117  NA 136  K 3.9  CL 102  CO2 25  GLUCOSE 212*  BUN 14  CREATININE 0.61  CALCIUM 8.8*  AST 19  ALT 11  ALKPHOS 81  BILITOT 1.2   ------------------------------------------------------------------------------------------------------------------  Cardiac Enzymes Recent Labs  Lab 02/08/19 0117  TROPONINI 0.04*    ------------------------------------------------------------------------------------------------------------------  RADIOLOGY:  Dg Chest 1 View  Result Date: 02/07/2019 CLINICAL DATA:  Pain status post fall EXAM: CHEST  1 VIEW COMPARISON:  01/28/2011 FINDINGS: The heart size and mediastinal contours are within normal limits. Both lungs are clear. The visualized skeletal structures are unremarkable. IMPRESSION: No active disease. Electronically Signed   By: Katherine Mantle M.D.   On: 02/07/2019 23:11   Ct Hip Right Wo Contrast  Result Date: 02/08/2019 CLINICAL DATA:  Pain EXAM: CT OF THE RIGHT HIP WITHOUT CONTRAST TECHNIQUE: Multidetector CT imaging of the right hip was performed according to the standard protocol. Multiplanar CT image reconstructions were also generated. COMPARISON:  X-ray from the same day. FINDINGS: Bones/Joint/Cartilage There is no displaced fracture. No dislocation. There are advanced degenerative changes of the right hip. Ligaments Suboptimally assessed by CT. Muscles and Tendons No abnormality detected. Soft tissues There is a large wide-mouth ventral wall hernia containing multiple loops of small bowel and likely some colon. IMPRESSION: No acute displaced fracture or dislocation. Electronically Signed   By: Katherine Mantle M.D.   On: 02/08/2019 00:14   Dg Knee Complete 4 Views Right  Result Date: 02/07/2019 CLINICAL DATA:  Pain status post fall EXAM: RIGHT KNEE - COMPLETE 4+ VIEW COMPARISON:  None. FINDINGS: There are end-stage degenerative changes of the right knee. There is no displaced fracture or dislocation. No significant joint effusion. There is diffuse osteopenia which limits detection of nondisplaced fractures. IMPRESSION: 1. No acute displaced fracture or dislocation. 2. End-stage degenerative changes of the knee. Electronically Signed   By: Katherine Mantle M.D.   On: 02/07/2019 23:05   Dg Hip Unilat With Pelvis 2-3 Views Right  Result Date: 02/07/2019  CLINICAL DATA:  Pain status post fall EXAM: DG HIP (WITH OR WITHOUT PELVIS) 2-3V RIGHT COMPARISON:  12/11/2018 FINDINGS: Evaluation is limited by suboptimal AP view of the right hip. There are degenerative changes of both hips. There is no displaced fracture or dislocation. Degenerative changes are noted of the lower lumbar spine. IMPRESSION: No acute displaced fracture or dislocation. Electronically Signed   By: Katherine Mantle M.D.   On: 02/07/2019 23:09      IMPRESSION AND PLAN:   1.  Bilateral lower extremity cellulitis - IV  Rocephin - Repeat CBC in the a.m. - Wound care nurse consulted for evaluation and recommendations  2.  Mechanical fall with generalized weakness - We will consult physical therapy for supportive care - No evidence of fracture or trauma  3.  Diabetes mellitus - Metformin continued - Moderate sliding scale insulin - Hemoglobin A1c pending  4.  Hypertension - Enalapril restarted - We will treat persistent hypertension expectantly  5.  Hypothyroidism --Synthroid restarted - TSH pending  6. elevated troponin - However patient denies chest pain or shortness of breath - We will continue to trend troponin levels - Telemetry monitoring - Repeat EKG in the a.m.  DVT and PPI prophylaxis initiated    All the records are reviewed and case discussed with ED provider. The plan of care was discussed in details with the patient (and family). I answered all questions. The patient agreed to proceed with the above mentioned plan. Further management will depend upon hospital course.   CODE STATUS: Full code  TOTAL TIME TAKING CARE OF THIS PATIENT: 45 minutes.    Milas Kocher Lori Popowski CRNP on 02/08/2019 at 4:11 AM  Pager - [phone]  After 6pm go to [email or web address] - Social research officer, government  [facility] [place] [facility]  Office  [phone]  CC: Primary care physician; Patient, No Pcp Per   Note: This dictation was prepared with Dragon dictation  along with smaller phrase technology. Any transcriptional errors that result from this process are unintentional.

## 2019-02-08 NOTE — Progress Notes (Signed)
[facility] - [place] at [facility]   PATIENT NAME: [name] [name]    MR#:  [id number]  DATE OF BIRTH:  1941-04-13  SUBJECTIVE:  CHIEF COMPLAINT:   Chief Complaint  Patient presents with  . Fall   Came with a fall and generalized weakness.  Chronic changes on both legs.  Urine seems positive with nitrate. No new complaints. REVIEW OF SYSTEMS:  CONSTITUTIONAL: No fever, have fatigue or weakness.  EYES: No blurred or double vision.  EARS, NOSE, AND THROAT: No tinnitus or ear pain.  RESPIRATORY: No cough, shortness of breath, wheezing or hemoptysis.  CARDIOVASCULAR: No chest pain, orthopnea, edema.  GASTROINTESTINAL: No nausea, vomiting, diarrhea or abdominal pain.  GENITOURINARY: No dysuria, hematuria.  ENDOCRINE: No polyuria, nocturia,  HEMATOLOGY: No anemia, easy bruising or bleeding SKIN: No rash or lesion. MUSCULOSKELETAL: No joint pain or arthritis.   NEUROLOGIC: No tingling, numbness, weakness.  PSYCHIATRY: No anxiety or depression.   ROS  DRUG ALLERGIES:   Allergies  Allergen Reactions  . Statins Other (See Comments)    Other reaction(s): UNKNOWN    VITALS:  Blood pressure 135/70, pulse (!) 102, temperature 98.3 F (36.8 C), resp. rate 20, height 5\' 11"  (1.803 m), weight 87.3 kg, SpO2 98 %.  PHYSICAL EXAMINATION:  GENERAL:  78 y.o.-year-old patient lying in the bed with no acute distress.  EYES: Pupils equal, round, reactive to light and accommodation. No scleral icterus. Extraocular muscles intact.  HEENT: Head atraumatic, normocephalic. Oropharynx and nasopharynx clear.  NECK:  Supple, no jugular venous distention. No thyroid enlargement, no tenderness.  LUNGS: Normal breath sounds bilaterally, no wheezing, rales,rhonchi or crepitation. No use of accessory muscles of respiration.  CARDIOVASCULAR: S1, S2 normal. No murmurs, rubs, or gallops.  ABDOMEN: Soft, nontender, nondistended. Bowel sounds present. No organomegaly or mass.  EXTREMITIES:  Bilateral legs chronic lymphatic changes with skin thickening and crusting.  Some warm and redness on left leg.  NEUROLOGIC: Cranial nerves II through XII are intact. Muscle strength 3-4/5 in all extremities. Sensation intact. Gait not checked.  PSYCHIATRIC: The patient is alert and oriented x 3.  SKIN: No obvious rash, lesion, or ulcer.   Physical Exam LABORATORY PANEL:   CBC Recent Labs  Lab 02/08/19 0514  WBC 10.2  HGB 10.9*  HCT 35.8*  PLT 288   ------------------------------------------------------------------------------------------------------------------  Chemistries  Recent Labs  Lab 02/08/19 0725  NA 134*  K 4.4  CL 100  CO2 23  GLUCOSE 155*  BUN 10  CREATININE 0.60  CALCIUM 8.4*  AST 19  ALT 11  ALKPHOS 72  BILITOT 1.1   ------------------------------------------------------------------------------------------------------------------  Cardiac Enzymes Recent Labs  Lab 02/08/19 0725 02/08/19 1319  TROPONINI 0.03* <0.03   ------------------------------------------------------------------------------------------------------------------  RADIOLOGY:  Dg Chest 1 View  Result Date: 02/07/2019 CLINICAL DATA:  Pain status post fall EXAM: CHEST  1 VIEW COMPARISON:  01/28/2011 FINDINGS: The heart size and mediastinal contours are within normal limits. Both lungs are clear. The visualized skeletal structures are unremarkable. IMPRESSION: No active disease. Electronically Signed   By: Katherine Mantle M.D.   On: 02/07/2019 23:11   Ct Hip Right Wo Contrast  Result Date: 02/08/2019 CLINICAL DATA:  Pain EXAM: CT OF THE RIGHT HIP WITHOUT CONTRAST TECHNIQUE: Multidetector CT imaging of the right hip was performed according to the standard protocol. Multiplanar CT image reconstructions were also generated. COMPARISON:  X-ray from the same day. FINDINGS: Bones/Joint/Cartilage There is no displaced fracture. No dislocation. There are advanced degenerative changes of the  right hip. Ligaments Suboptimally assessed by CT. Muscles and Tendons No abnormality detected. Soft tissues There is a large wide-mouth ventral wall hernia containing multiple loops of small bowel and likely some colon. IMPRESSION: No acute displaced fracture or dislocation. Electronically Signed   By: Katherine Mantlehristopher  Green M.D.   On: 02/08/2019 00:14   Dg Knee Complete 4 Views Right  Result Date: 02/07/2019 CLINICAL DATA:  Pain status post fall EXAM: RIGHT KNEE - COMPLETE 4+ VIEW COMPARISON:  None. FINDINGS: There are end-stage degenerative changes of the right knee. There is no displaced fracture or dislocation. No significant joint effusion. There is diffuse osteopenia which limits detection of nondisplaced fractures. IMPRESSION: 1. No acute displaced fracture or dislocation. 2. End-stage degenerative changes of the knee. Electronically Signed   By: Katherine Mantlehristopher  Green M.D.   On: 02/07/2019 23:05   Dg Hip Unilat With Pelvis 2-3 Views Right  Result Date: 02/07/2019 CLINICAL DATA:  Pain status post fall EXAM: DG HIP (WITH OR WITHOUT PELVIS) 2-3V RIGHT COMPARISON:  12/11/2018 FINDINGS: Evaluation is limited by suboptimal AP view of the right hip. There are degenerative changes of both hips. There is no displaced fracture or dislocation. Degenerative changes are noted of the lower lumbar spine. IMPRESSION: No acute displaced fracture or dislocation. Electronically Signed   By: Katherine Mantlehristopher  Green M.D.   On: 02/07/2019 23:09    ASSESSMENT AND PLAN:   Active Problems:   Cellulitis  *  Bilateral lower extremity cellulitis-this was suspected on admission but less likely as this seems to be chronic changes. - Repeat CBC in the a.m. - Wound care nurse consulted for evaluation and recommendations  *UTI Patient's urinalysis have nitrite positive Had ESBL in previous culture results Change Rocephin to fosfomycin.  *  Mechanical fall with generalized weakness - We will consult physical therapy for  supportive care - No evidence of fracture or trauma  *  Diabetes mellitus - Metformin continued - Moderate sliding scale insulin - Hemoglobin A1c pending  *  Hypertension - Enalapril restarted - We will treat persistent hypertension expectantly  *  Hypothyroidism --Synthroid restarted - TSH reviewed.  * elevated troponin - patient denies chest pain or shortness of breath -Repeat troponin remains negative on follow-up.    All the records are reviewed and case discussed with Care Management/Social Workerr. Management plans discussed with the patient, family and they are in agreement.  CODE STATUS: Partial  TOTAL TIME TAKING CARE OF THIS PATIENT: 35 minutes.     POSSIBLE D/C IN 1-2 DAYS, DEPENDING ON CLINICAL CONDITION.   Altamese DillingVaibhavkumar Cheryln Balcom M.D on 02/08/2019   Between 7am to 6pm - Pager - [phone]  After 6pm go to [email or web address] - password [email or web address]  [facility] [place] [facility]  Office  [phone]  CC: Primary care physician; Patient, No Pcp Per  Note: This dictation was prepared with Dragon dictation along with smaller phrase technology. Any transcriptional errors that result from this process are unintentional.

## 2019-02-08 NOTE — ED Notes (Signed)
Unable to draw a

## 2019-02-08 NOTE — ED Notes (Signed)
ED TO INPATIENT HANDOFF REPORT  ED Nurse Name and Phone #: Madelon LipsJen  S Name/Age/Gender Alben SpittleMelissa Shugart 78 y.o. female Room/Bed: ED26A/ED26A  Code Status   Code Status: Full Code  Home/SNF/Other Home Patient oriented to: self, place, time and situation Is this baseline? Yes   Triage Complete: Triage complete  Chief Complaint fall ems  Triage Note Pt presents from home via acems with c/o mechanical fall. Pt states she slipped out of chair a few hours ago.Pt denies LOC or use of blood thinners. Pt c/o bilateral knee pain, pt able to bear weight on legs for ems. Pt blood sugar 215.   Allergies Allergies  Allergen Reactions  . Statins Other (See Comments)    Other reaction(s): UNKNOWN    Level of Care/Admitting Diagnosis ED Disposition    ED Disposition Condition Comment   Admit  Hospital [facility]: [facility] [[id number]]  Level of Care: Med-Surg [16]  Covid Evaluation: N/A  Diagnosis: Cellulitis [[id number]]  Admitting Physician: Willadean CarolMAYO, KATY DODD [[id number]]  Attending Physician: Willadean CarolMAYO, KATY DODD [[id number]]  Estimated length of stay: past midnight tomorrow  Certification:: I certify this patient will need inpatient services for at least 2 midnights  PT Class (Do Not Modify): Inpatient [101]  PT Acc Code (Do Not Modify): Private [1]       B Medical/Surgery History Past Medical History:  Diagnosis Date  . Allergic rhinitis   . Asthma   . 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 Diverticulitis   . Hypertensive heart disease with heart failure (HCC)   . Pain in right shoulder   . Primary open-angle glaucoma, bilateral, stage unspecified   . Thyroid disease    hypothroidism, unspecified  . Venous insufficiency (chronic) (peripheral)   . Vitamin D deficiency    Past Surgical History:  Procedure Laterality Date  . none       A IV Location/Drains/Wounds Patient Lines/Drains/Airways Status    Active Line/Drains/Airways    Name:   Placement date:   Placement time:   Site:   Days:   Peripheral IV 12/10/18 Right Antecubital   12/10/18    0343    Antecubital   60   Peripheral IV 02/08/19 Right Hand   02/08/19    0129    Hand   less than 1   External Urinary Catheter   -    1929    -      External Urinary Catheter   02/08/19    0154    -   less than 1   Wound / Incision (Open or Dehisced) 03/13/17 Diabetic ulcer Foot Right;Mid;Posterior   03/13/17    0715    Foot   697   Wound / Incision (Open or Dehisced) 03/13/17 Diabetic ulcer Foot Right;Posterior;Mid   03/13/17    0715    Foot   697          Intake/Output Last 24 hours No intake or output data in the 24 hours ending 02/08/19 0419  Labs/Imaging Results for orders placed or performed during the hospital encounter of 02/07/19 (from the past 48 hour(s))  Comprehensive metabolic panel     Status: Abnormal   Collection Time: 02/08/19  1:17 AM  Result Value Ref Range   Sodium 136 135 - 145 mmol/L   Potassium 3.9 3.5 - 5.1 mmol/L   Chloride 102 98 - 111 mmol/L   CO2 25 22 - 32 mmol/L  Glucose, Bld 212 (H) 70 - 99 mg/dL   BUN 14 8 - 23 mg/dL   Creatinine, Ser 0.09 0.44 - 1.00 mg/dL   Calcium 8.8 (L) 8.9 - 10.3 mg/dL   Total Protein 7.1 6.5 - 8.1 g/dL   Albumin 3.8 3.5 - 5.0 g/dL   AST 19 15 - 41 U/L   ALT 11 0 - 44 U/L   Alkaline Phosphatase 81 38 - 126 U/L   Total Bilirubin 1.2 0.3 - 1.2 mg/dL   GFR calc non Af Amer >60 >60 mL/min   GFR calc Af Amer >60 >60 mL/min   Anion gap 9 5 - 15    Comment: Performed at [facility], [address] [place], [place] [address]  Troponin I - Once     Status: Abnormal   Collection Time: 02/08/19  1:17 AM  Result Value Ref Range   Troponin I 0.04 (HH) <0.03 ng/mL    Comment: CRITICAL RESULT CALLED TO, READ BACK BY AND VERIFIED WITH ANNIE SMITH AR 0234 ON 02/08/2019 JJB Performed at [facility], [address] [place], [place] [address]   Brain natriuretic  peptide     Status: None   Collection Time: 02/08/19  1:17 AM  Result Value Ref Range   B Natriuretic Peptide 28.0 0.0 - 100.0 pg/mL    Comment: Performed at [facility], [address] [place], [place] [address]  CBC with Differential     Status: Abnormal   Collection Time: 02/08/19  1:17 AM  Result Value Ref Range   WBC 11.5 (H) 4.0 - 10.5 K/uL   RBC 4.11 3.87 - 5.11 MIL/uL   Hemoglobin 10.8 (L) 12.0 - 15.0 g/dL   HCT 93.8 (L) 10.1 - 75.1 %   MCV 84.9 80.0 - 100.0 fL   MCH 26.3 26.0 - 34.0 pg   MCHC 30.9 30.0 - 36.0 g/dL   RDW 02.5 85.2 - 77.8 %   Platelets 258 150 - 400 K/uL   nRBC 0.0 0.0 - 0.2 %   Neutrophils Relative % 83 %   Neutro Abs 9.4 (H) 1.7 - 7.7 K/uL   Lymphocytes Relative 8 %   Lymphs Abs 0.9 0.7 - 4.0 K/uL   Monocytes Relative 8 %   Monocytes Absolute 0.9 0.1 - 1.0 K/uL   Eosinophils Relative 0 %   Eosinophils Absolute 0.0 0.0 - 0.5 K/uL   Basophils Relative 0 %   Basophils Absolute 0.0 0.0 - 0.1 K/uL   Immature Granulocytes 1 %   Abs Immature Granulocytes 0.10 (H) 0.00 - 0.07 K/uL    Comment: Performed at [facility], [address], [place], [place] [address]  SARS Coronavirus 2 (CEPHEID - Performed in [facility] hospital lab), Hosp Order     Status: None   Collection Time: 02/08/19  1:17 AM  Result Value Ref Range   SARS Coronavirus 2 NEGATIVE NEGATIVE    Comment: (NOTE) If result is NEGATIVE SARS-CoV-2 target nucleic acids are NOT DETECTED. The SARS-CoV-2 RNA is generally detectable in upper and lower  respiratory specimens during the acute phase of infection. The lowest  concentration of SARS-CoV-2 viral copies this assay can detect is 250  copies / mL. A negative result does not preclude SARS-CoV-2 infection  and should not be used as the sole basis for treatment or other  patient management decisions.  A negative result may occur with  improper specimen collection / handling, submission of specimen other  than nasopharyngeal  swab, presence of viral mutation(s) within the  areas targeted by this assay, and inadequate number of viral copies  (<250 copies / mL). A negative result must be combined with clinical  observations, patient history, and epidemiological information. If result is POSITIVE SARS-CoV-2 target nucleic acids are DETECTED. The SARS-CoV-2 RNA is generally detectable in upper and lower  respiratory specimens dur ing the acute phase of infection.  Positive  results are indicative of active infection with SARS-CoV-2.  Clinical  correlation with patient history and other diagnostic information is  necessary to determine patient infection status.  Positive results do  not rule out bacterial infection or co-infection with other viruses. If result is PRESUMPTIVE POSTIVE SARS-CoV-2 nucleic acids MAY BE PRESENT.   A presumptive positive result was obtained on the submitted specimen  and confirmed on repeat testing.  While 2019 novel coronavirus  (SARS-CoV-2) nucleic acids may be present in the submitted sample  additional confirmatory testing may be necessary for epidemiological  and / or clinical management purposes  to differentiate between  SARS-CoV-2 and other Sarbecovirus currently known to infect humans.  If clinically indicated additional testing with an alternate test  methodology [phone]) is advised. The SARS-CoV-2 RNA is generally  detectable in upper and lower respiratory sp ecimens during the acute  phase of infection. The expected result is Negative. Fact Sheet for Patients:  BoilerBrush.com.cy Fact Sheet for Healthcare Providers: https://pope.com/ This test is not yet approved or cleared by the [place] FDA and has been authorized for detection and/or diagnosis of SARS-CoV-2 by FDA under an Emergency Use Authorization (EUA).  This EUA will remain in effect (meaning this test can be used) for the duration of the COVID-19 declaration  under Section 564(b)(1) of the Act, 21 U.S.C. section 360bbb-3(b)(1), unless the authorization is terminated or revoked sooner. Performed at [facility], [address] [place], [place] [address]    Dg Chest 1 View  Result Date: 02/07/2019 CLINICAL DATA:  Pain status post fall EXAM: CHEST  1 VIEW COMPARISON:  01/28/2011 FINDINGS: The heart size and mediastinal contours are within normal limits. Both lungs are clear. The visualized skeletal structures are unremarkable. IMPRESSION: No active disease. Electronically Signed   By: Katherine Mantle M.D.   On: 02/07/2019 23:11   Ct Hip Right Wo Contrast  Result Date: 02/08/2019 CLINICAL DATA:  Pain EXAM: CT OF THE RIGHT HIP WITHOUT CONTRAST TECHNIQUE: Multidetector CT imaging of the right hip was performed according to the standard protocol. Multiplanar CT image reconstructions were also generated. COMPARISON:  X-ray from the same day. FINDINGS: Bones/Joint/Cartilage There is no displaced fracture. No dislocation. There are advanced degenerative changes of the right hip. Ligaments Suboptimally assessed by CT. Muscles and Tendons No abnormality detected. Soft tissues There is a large wide-mouth ventral wall hernia containing multiple loops of small bowel and likely some colon. IMPRESSION: No acute displaced fracture or dislocation. Electronically Signed   By: Katherine Mantle M.D.   On: 02/08/2019 00:14   Dg Knee Complete 4 Views Right  Result Date: 02/07/2019 CLINICAL DATA:  Pain status post fall EXAM: RIGHT KNEE - COMPLETE 4+ VIEW COMPARISON:  None. FINDINGS: There are end-stage degenerative changes of the right knee. There is no displaced fracture or dislocation. No significant joint effusion. There is diffuse osteopenia which limits detection of nondisplaced fractures. IMPRESSION: 1. No acute displaced fracture or dislocation. 2. End-stage degenerative changes of the knee. Electronically Signed   By: Katherine Mantle M.D.   On:  02/07/2019  23:05   Dg Hip Unilat With Pelvis 2-3 Views Right  Result Date: 02/07/2019 CLINICAL DATA:  Pain status post fall EXAM: DG HIP (WITH OR WITHOUT PELVIS) 2-3V RIGHT COMPARISON:  12/11/2018 FINDINGS: Evaluation is limited by suboptimal AP view of the right hip. There are degenerative changes of both hips. There is no displaced fracture or dislocation. Degenerative changes are noted of the lower lumbar spine. IMPRESSION: No acute displaced fracture or dislocation. Electronically Signed   By: Katherine Mantle M.D.   On: 02/07/2019 23:09    Pending Labs Unresulted Labs (From admission, onward)    Start     Ordered   02/15/19 0500  Creatinine, serum  (enoxaparin (LOVENOX)    CrCl >/= 30 ml/min)  Weekly,   STAT    Comments:  while on enoxaparin therapy    02/08/19 0332   02/08/19 0500  Basic metabolic panel  Tomorrow morning,   STAT     02/08/19 0332   02/08/19 0500  CBC  Tomorrow morning,   STAT     02/08/19 0332   02/08/19 0410  Comprehensive metabolic panel  Once,   STAT     02/08/19 0410   02/08/19 0410  Troponin I -  Once,   STAT     02/08/19 0410   02/08/19 0333  CBC  (enoxaparin (LOVENOX)    CrCl >/= 30 ml/min)  Once,   STAT    Comments:  Baseline for enoxaparin therapy IF NOT ALREADY DRAWN.  Notify MD if PLT < 100 K.    02/08/19 0332   02/08/19 0333  Creatinine, serum  (enoxaparin (LOVENOX)    CrCl >/= 30 ml/min)  Once,   STAT    Comments:  Baseline for enoxaparin therapy IF NOT ALREADY DRAWN.    02/08/19 0332   02/08/19 0333  TSH  Once,   STAT     02/08/19 0332   02/08/19 0333  Brain natriuretic peptide  Once,   STAT     02/08/19 0332   02/08/19 0333  Hemoglobin A1c  Once,   STAT     02/08/19 0332   02/08/19 0323  Lactic acid, plasma  STAT Now then every 3 hours,   STAT     02/08/19 0322   02/08/19 0321  Protime-INR  Once,   STAT     02/08/19 0320   02/08/19 0321  Urinalysis, Routine w reflex microscopic  Once,   STAT     02/08/19 0320   02/08/19 0320  CBC   Once,   STAT     02/08/19 0320   02/08/19 0320  Troponin I - Now Then Q6H  Now then every 6 hours,   STAT     02/08/19 0320   02/08/19 0320  Brain natriuretic peptide  Once,   STAT     02/08/19 0320   02/08/19 0252  Culture, blood (routine x 2)  BLOOD CULTURE X 2,   STAT    Comments:  for severe disease only    02/08/19 0253   02/08/19 0055  Urinalysis, Complete w Microscopic  ONCE - STAT,   STAT     02/08/19 0054          Vitals/Pain Today's Vitals   02/08/19 0145 02/08/19 0300 02/08/19 0330 02/08/19 0351  BP: 137/75  (!) 171/72   Pulse: (!) 118 (!) 105 97   Resp:      Temp:      TempSrc:      SpO2: 98% 96%  96%   Weight:      Height:      PainSc:    Asleep    Isolation Precautions No active isolations  Medications Medications  aspirin EC tablet 81 mg (has no administration in time range)  latanoprost (XALATAN) 0.005 % ophthalmic solution 1 drop (has no administration in time range)  brimonidine (ALPHAGAN) 0.15 % ophthalmic solution 1 drop (has no administration in time range)  enalapril (VASOTEC) tablet 20 mg (has no administration in time range)  levothyroxine (SYNTHROID) tablet 75 mcg (has no administration in time range)  metFORMIN (GLUCOPHAGE-XR) 24 hr tablet 500 mg (has no administration in time range)  timolol (BETIMOL) 0.5 % ophthalmic solution 1 drop (has no administration in time range)  enoxaparin (LOVENOX) injection 40 mg (has no administration in time range)  ondansetron (ZOFRAN) tablet 4 mg (has no administration in time range)    Or  ondansetron (ZOFRAN) injection 4 mg (has no administration in time range)  folic acid (FOLVITE) tablet 1 mg (has no administration in time range)  multivitamin with minerals tablet 1 tablet (has no administration in time range)  thiamine (VITAMIN B-1) tablet 100 mg (has no administration in time range)  insulin aspart (novoLOG) injection 0-15 Units (has no administration in time range)  insulin aspart (novoLOG) injection  0-5 Units (has no administration in time range)  ipratropium-albuterol (DUONEB) 0.5-2.5 (3) MG/3ML nebulizer solution 3 mL (has no administration in time range)  cefTRIAXone (ROCEPHIN) 2 g in sodium chloride 0.9 % 100 mL IVPB (has no administration in time range)  Ampicillin-Sulbactam (UNASYN) 3 g in sodium chloride 0.9 % 100 mL IVPB (0 g Intravenous Stopped 02/08/19 0419)    Mobility non-ambulatory High fall risk (walked at home with a walker)  Focused Assessments Cardiac Assessment Handoff:  Cardiac Rhythm: Sinus tachycardia Lab Results  Component Value Date   CKTOTAL 271 (H) 12/11/2018   TROPONINI 0.04 (HH) 02/08/2019   No results found for: DDIMER Does the Patient currently have chest pain? No     R Recommendations: See Admitting Provider Note  Report given to:   Additional Notes:  Pt fell at home, cellulitis to legs. On the [facility] program

## 2019-02-08 NOTE — ED Provider Notes (Addendum)
[name] [name] CT is negative.  We are setting her up so we can try and walk her and she complains a lot of pain in the right knee.  X-ray of that knee only shows severe degenerative joint disease.  [name] [name] does have cellulitis of the other leg.  Additionally she is too weak to get up and walk.  She lives by herself 3 do not be able to send her home tonight.  We will get her in the hospital to get her some antibiotics for her cellulitis and possibly physical therapy and see if we can get her home later.   Arnaldo Natal, MD 02/08/19 0054 [name] was evaluated in Emergency Department on 02/08/2019 for the symptoms described in the history of present illness. She was evaluated in the context of the global COVID-19 pandemic, which necessitated consideration that the [name] [name] might be at risk for infection with the SARS-CoV-2 virus that causes COVID-19. Institutional protocols and algorithms that pertain to the evaluation of patients at risk for COVID-19 are in a state of rapid change based on information released by regulatory bodies including the CDC and federal and state organizations. These policies and algorithms were followed during the [name] [name]'s care in the ED.   Arnaldo Natal, MD 02/08/19 0157

## 2019-02-09 LAB — URINE CULTURE: Culture: <10000 — AB

## 2019-02-09 LAB — GLUCOSE, CAPILLARY
Glucose-Capillary: 119 mg/dL — ABNORMAL HIGH (ref 70–99)
Glucose-Capillary: 126 mg/dL — ABNORMAL HIGH (ref 70–99)
Glucose-Capillary: 107 mg/dL — ABNORMAL HIGH (ref 70–99)
Glucose-Capillary: 118 mg/dL — ABNORMAL HIGH (ref 70–99)

## 2019-02-09 MED ORDER — SODIUM CHLORIDE 0.9 % IV SOLN
INTRAVENOUS | Status: DC | PRN
  Administered 2019-02-09 – 2019-02-10 (×2): 500 mL via INTRAVENOUS

## 2019-02-09 MED ORDER — CEFAZOLIN SODIUM-DEXTROSE 1-4 GM/50ML-% IV SOLN
1.0000 g | Freq: Three times a day (TID) | INTRAVENOUS | Status: DC
  Administered 2019-02-09 – 2019-02-10 (×4): 1 g via INTRAVENOUS
  Filled 2019-02-09 (×8): qty 50

## 2019-02-09 NOTE — Progress Notes (Addendum)
[employer] [facility] - [place] at [facility]   PATIENT NAME: [name] [name]    MR#:  [id number]  DATE OF BIRTH:  October 25, 1940  SUBJECTIVE:  Patient without complaint, urine culture noted for insignificant growth, start cellulitis protocol given concern for acute bilateral lower extremity cellulitis compounded by lymphedema, wound clinic to see given chronic lymphedema/lichenification of skin REVIEW OF SYSTEMS:  CONSTITUTIONAL: No fever, have fatigue or weakness.  EYES: No blurred or double vision.  EARS, NOSE, AND THROAT: No tinnitus or ear pain.  RESPIRATORY: No cough, shortness of breath, wheezing or hemoptysis.  CARDIOVASCULAR: No chest pain, orthopnea, edema.  GASTROINTESTINAL: No nausea, vomiting, diarrhea or abdominal pain.  GENITOURINARY: No dysuria, hematuria.  ENDOCRINE: No polyuria, nocturia,  HEMATOLOGY: No anemia, easy bruising or bleeding SKIN: No rash or lesion. MUSCULOSKELETAL: No joint pain or arthritis.   NEUROLOGIC: No tingling, numbness, weakness.  PSYCHIATRY: No anxiety or depression.   ROS  DRUG ALLERGIES:   Allergies  Allergen Reactions  . Statins Other (See Comments)    Other reaction(s): UNKNOWN    VITALS:  Blood pressure (!) 117/58, pulse 76, temperature 99.5 F (37.5 C), temperature source Oral, resp. rate 16, height 5\' 11"  (1.803 m), weight 87.3 kg, SpO2 97 %.  PHYSICAL EXAMINATION:  GENERAL:  78 y.o.-year-old patient lying in the bed with no acute distress.  EYES: Pupils equal, round, reactive to light and accommodation. No scleral icterus. Extraocular muscles intact.  HEENT: Head atraumatic, normocephalic. Oropharynx and nasopharynx clear.  NECK:  Supple, no jugular venous distention. No thyroid enlargement, no tenderness.  LUNGS: Normal breath sounds bilaterally, no wheezing, rales,rhonchi or crepitation. No use of accessory muscles of respiration.  CARDIOVASCULAR: S1, S2 normal. No murmurs, rubs, or gallops.  ABDOMEN: Soft, nontender,  nondistended. Bowel sounds present. No organomegaly or mass.  EXTREMITIES: Bilateral legs chronic lymphatic changes with skin thickening and crusting.  Some warm and redness on left leg.  NEUROLOGIC: Cranial nerves II through XII are intact. Muscle strength 3-4/5 in all extremities. Sensation intact. Gait not checked.  PSYCHIATRIC: The patient is alert and oriented x 3.  SKIN: No obvious rash, lesion, or ulcer.   Physical Exam LABORATORY PANEL:   CBC Recent Labs  Lab 02/08/19 0514  WBC 10.2  HGB 10.9*  HCT 35.8*  PLT 288   ------------------------------------------------------------------------------------------------------------------  Chemistries  Recent Labs  Lab 02/08/19 0725  NA 134*  K 4.4  CL 100  CO2 23  GLUCOSE 155*  BUN 10  CREATININE 0.60  CALCIUM 8.4*  AST 19  ALT 11  ALKPHOS 72  BILITOT 1.1   ------------------------------------------------------------------------------------------------------------------  Cardiac Enzymes Recent Labs  Lab 02/08/19 0725 02/08/19 1319  TROPONINI 0.03* <0.03   ------------------------------------------------------------------------------------------------------------------  RADIOLOGY:  Dg Chest 1 View  Result Date: 02/07/2019 CLINICAL DATA:  Pain status post fall EXAM: CHEST  1 VIEW COMPARISON:  01/28/2011 FINDINGS: The heart size and mediastinal contours are within normal limits. Both lungs are clear. The visualized skeletal structures are unremarkable. IMPRESSION: No active disease. Electronically Signed   By: Katherine Mantle M.D.   On: 02/07/2019 23:11   Ct Hip Right Wo Contrast  Result Date: 02/08/2019 CLINICAL DATA:  Pain EXAM: CT OF THE RIGHT HIP WITHOUT CONTRAST TECHNIQUE: Multidetector CT imaging of the right hip was performed according to the standard protocol. Multiplanar CT image reconstructions were also generated. COMPARISON:  X-ray from the same day. FINDINGS: Bones/Joint/Cartilage There is no  displaced fracture. No dislocation. There are advanced degenerative changes of the right hip.  Ligaments Suboptimally assessed by CT. Muscles and Tendons No abnormality detected. Soft tissues There is a large wide-mouth ventral wall hernia containing multiple loops of small bowel and likely some colon. IMPRESSION: No acute displaced fracture or dislocation. Electronically Signed   By: Katherine Mantlehristopher  Green M.D.   On: 02/08/2019 00:14   Dg Knee Complete 4 Views Right  Result Date: 02/07/2019 CLINICAL DATA:  Pain status post fall EXAM: RIGHT KNEE - COMPLETE 4+ VIEW COMPARISON:  None. FINDINGS: There are end-stage degenerative changes of the right knee. There is no displaced fracture or dislocation. No significant joint effusion. There is diffuse osteopenia which limits detection of nondisplaced fractures. IMPRESSION: 1. No acute displaced fracture or dislocation. 2. End-stage degenerative changes of the knee. Electronically Signed   By: Katherine Mantlehristopher  Green M.D.   On: 02/07/2019 23:05   Dg Hip Unilat With Pelvis 2-3 Views Right  Result Date: 02/07/2019 CLINICAL DATA:  Pain status post fall EXAM: DG HIP (WITH OR WITHOUT PELVIS) 2-3V RIGHT COMPARISON:  12/11/2018 FINDINGS: Evaluation is limited by suboptimal AP view of the right hip. There are degenerative changes of both hips. There is no displaced fracture or dislocation. Degenerative changes are noted of the lower lumbar spine. IMPRESSION: No acute displaced fracture or dislocation. Electronically Signed   By: Katherine Mantlehristopher  Green M.D.   On: 02/07/2019 23:09    ASSESSMENT AND PLAN:  *  Bilateral lower extremity cellulitis Stable Cellulitis protocol, IV Ancef, wound care nurse consulted given chronic lymphedema/lichenification of skin  *Acute probable UTI  Ruled out-urinalysis noted for insignificant growth   *Acute mechanical fall with generalized weakness Exacerbated by above Physical therapy recommending skilled nursing facility status post  discharge  *Chronic diabetes mellitus Stable on current regiment  *Hypertension Stable on current regiment  *Hypothyroidism Continue Synthroid   *Acute elevated troponins  Most likely secondary to demand ischemia  Cardiac enzymes inconsistent with acute coronary syndrome  Conservative medical management recommended   Disposition to skilled nursing facility in 1 to 2 days barring any complications    All the records are reviewed and case discussed with Care Management/Social Workerr. Management plans discussed with the patient, family and they are in agreement.  CODE STATUS: Partial  TOTAL TIME TAKING CARE OF THIS PATIENT: 35 minutes.     POSSIBLE D/C IN 1-2 DAYS, DEPENDING ON CLINICAL CONDITION.   Evelena AsaMontell D Salary M.D on 02/09/2019   Between 7am to 6pm - Pager - [phone]  After 6pm go to [email or web address] - password [email or web address]  [facility]  Office  [phone]  CC: Primary care physician; Patient, No Pcp Per  Note: This dictation was prepared with Dragon dictation along with smaller phrase technology. Any transcriptional errors that result from this process are unintentional.

## 2019-02-09 NOTE — Consult Note (Signed)
Lymphedema  Resources (updated July 2019 ) Each site requires a referral from your primary care MD [facility] [address] [place], [place]  [phone] (Upper extremities)  [address] [place], [place] [phone] (Lower extremities)  [facility] [address] [place], [place] [address] [phone] [facility] [address] [place], [place] [address] [phone] [facility] [address] Medical Office Building [address]  [place], [place] [phone]  [facility] [address] [place] [address] [place], [place] [address] [phone]  [facility]  (only treatment for lymphedema related to cancer diagnosis) [address]  [place], [place] [address] [phone]    [facility] [address] [place], [place] [address] [phone]  [facility] [address] [place], [place] [address] [phone] [facility] ([facility]) [address] [place], [place] [address] [phone]    Caidyn Blossom [address] MSN,RN,CWOCN, CNS, CWON-AP [phone]

## 2019-02-10 LAB — GLUCOSE, CAPILLARY
Glucose-Capillary: 118 mg/dL — ABNORMAL HIGH (ref 70–99)
Glucose-Capillary: 142 mg/dL — ABNORMAL HIGH (ref 70–99)

## 2019-02-10 MED ORDER — CLOBETASOL PROPIONATE 0.05 % EX GEL: 1.0000 "application " | Freq: Two times a day (BID) | CUTANEOUS | 0 refills | Status: DC

## 2019-02-10 MED ORDER — CEPHALEXIN 500 MG PO CAPS: 500.0000 mg | ORAL_CAPSULE | Freq: Four times a day (QID) | ORAL | 0 refills | Status: AC

## 2019-02-10 MED ORDER — CLOBETASOL PROPIONATE 0.05 % EX GEL: 1.0000 "application " | Freq: Two times a day (BID) | CUTANEOUS | 0 refills | Status: AC

## 2019-02-10 MED ORDER — INSULIN ASPART 100 UNIT/ML ~~LOC~~ SOLN: 0.0000 [IU] | Freq: Every day | SUBCUTANEOUS | 0 refills | Status: DC

## 2019-02-10 MED ORDER — INSULIN ASPART 100 UNIT/ML ~~LOC~~ SOLN: 0.0000 [IU] | Freq: Three times a day (TID) | SUBCUTANEOUS | 0 refills | Status: DC

## 2019-02-10 MED ORDER — ADULT MULTIVITAMIN W/MINERALS CH: 1.0000 | ORAL_TABLET | Freq: Every day | ORAL | 0 refills | Status: AC

## 2019-02-10 NOTE — TOC Initial Note (Signed)
Transition of Care [facility]) - Initial/Assessment Note    Patient Details  Name: [name] [name] MRN: [id number] Date of Birth: 1941-01-29  [facility] CM/SW Contact:    Annamaria Boots, [place] Phone Number: 02/10/2019, 2:21 PM  Clinical Narrative: CSW consulted for facility placement. CSW met with patient to discuss discharge plan. Patient reports that she lives alone and is a [facility] patient. Patient also states that she recently discharged home from [facility]. CSW explained recommendation of SNF. Patient is in agreement. CSW spoke with [name] from [place] regarding SNF recommendation. [name] is in agreement with SNF and will inform CSW when transportation would be available. CSW also spoke with [name] at [facility] regarding patient. [name] states that they can accept patient but is unsure if she has a bed available today. [name] will follow up with CSW. Patient should discharge to facility today.                  Expected Discharge Plan: Skilled Nursing Facility Barriers to Discharge: No Barriers Identified   Patient Goals and CMS Choice   CMS Medicare.gov Compare Post Acute Care list provided to:: Patient    Expected Discharge Plan and Services Expected Discharge Plan: [place]       Living arrangements for the past 2 months: Single Family Home Expected Discharge Date: 02/10/19                                    Prior Living Arrangements/Services Living arrangements for the past 2 months: Single Family Home Lives with:: Self Patient language and need for interpreter reviewed:: Yes Do you feel safe going back to the place where you live?: Yes      Need for Family Participation in Patient Care: Yes (Comment) Care giver support system in place?: Yes (comment)   Criminal Activity/Legal Involvement Pertinent to Current Situation/Hospitalization: No - Comment as needed  Activities of Daily Living Home Assistive Devices/Equipment: Walker (specify  type) ADL Screening (condition at time of admission) Patient's cognitive ability adequate to safely complete daily activities?: Yes Is the patient deaf or have difficulty hearing?: No Does the patient have difficulty seeing, even when wearing glasses/contacts?: Yes Does the patient have difficulty concentrating, remembering, or making decisions?: No Patient able to express need for assistance with ADLs?: Yes Does the patient have difficulty dressing or bathing?: Yes Independently performs ADLs?: No Communication: Independent Dressing (OT): Needs assistance Is this a change from baseline?: Pre-admission baseline Grooming: Needs assistance Feeding: Independent Bathing: Needs assistance Is this a change from baseline?: Pre-admission baseline Toileting: Needs assistance Is this a change from baseline?: Pre-admission baseline In/Out Bed: Needs assistance Is this a change from baseline?: Pre-admission baseline Walks in Home: Needs assistance Is this a change from baseline?: Pre-admission baseline Does the patient have difficulty walking or climbing stairs?: Yes Weakness of Legs: Both Weakness of Arms/Hands: Both  Permission Sought/Granted Permission sought to share information with : Case Manager, Customer service manager, Family Supports Permission granted to share information with : Yes, Verbal Permission Granted              Emotional Assessment Appearance:: Appears stated age   Affect (typically observed): Accepting, Calm Orientation: : Oriented to Self, Oriented to Place, Oriented to  Time Alcohol / Substance Use: Not Applicable Psych Involvement: No (comment)  Admission diagnosis:  Fall [W19.XXXA] Patient Active Problem List   Diagnosis Date Noted  .  Cellulitis 02/08/2019  . UTI (urinary tract infection) 12/10/2018  . Osteomyelitis ([place]) 03/13/2017  . Diabetic ulcer of toe of right foot associated with type 2 diabetes mellitus, limited to breakdown of skin ([place])    . DNR (do not resuscitate)   . Goals of care, counseling/discussion   . Palliative care encounter   . Diabetic foot ulcer with osteomyelitis ([place]) 07/06/2015   PCP:  Patient, No Pcp Per Pharmacy:   [place], [place] [address] Phone: [phone] Fax: [phone]     Social Determinants of Health (SDOH) Interventions    Readmission Risk Interventions No flowsheet data found.

## 2019-02-10 NOTE — Progress Notes (Signed)
Report called to Daymon Larsen RN  At   [facility]

## 2019-02-10 NOTE — TOC Transition Note (Signed)
Transition of Care [facility]) - CM/SW Discharge Note   Patient Details  Name: [name] [name] MRN: [id number] Date of Birth: 1941/09/08  [facility] of [facility] CM/SW Contact:  Ruthe Mannan, LCSWA Phone Number: 02/10/2019, 3:25 PM   Clinical Narrative:   Patient will discharge to [facility] today. CSW notified patient, [facility] and [facility] Fermo [facility] of discharge today. Patient will be transported by [facility]. RN will call report.     Final next level of care: Skilled Nursing Facility Barriers to Discharge: No Barriers Identified   Patient Goals and CMS Choice   CMS Medicare.gov Compare Post Acute Care list provided to:: Patient    Discharge Placement                       Discharge Plan and Services                                     Social Determinants of Health (SDOH) Interventions     Readmission Risk Interventions No flowsheet data found.

## 2019-02-10 NOTE — Discharge Summary (Addendum)
[facility]   PATIENT NAME: [name] [name]    MR#:  [id number]  DATE OF BIRTH:  [birth year]  DATE OF ADMISSION:  02/07/2019 ADMITTING PHYSICIAN: Campbell Stall, MD  DATE OF DISCHARGE: No discharge date for patient encounter.  PRIMARY CARE PHYSICIAN: Patient, No Pcp Per    ADMISSION DIAGNOSIS:  Fall [W19.XXXA]  DISCHARGE DIAGNOSIS:  Active Problems:   Cellulitis   SECONDARY DIAGNOSIS:   Past Medical History:  Diagnosis Date  . Allergic rhinitis   . Asthma   . 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 Diverticulitis   . Hypertensive heart disease with heart failure (HCC)   . Pain in right shoulder   . Primary open-angle glaucoma, bilateral, stage unspecified   . Thyroid disease    hypothroidism, unspecified  . Venous insufficiency (chronic) (peripheral)   . Vitamin D deficiency     HOSPITAL COURSE:  *Acutebilateral lower extremity cellulitis Resolving slowly Treated on our cellulitis protocol, IV Ancef while in house-converted to Keflex status post discharge to complete antibiotic course, wound care nurse did see patient while in house given chronic lymphedema/lichenification of skin  *Acute probable UTI  Ruled out-urinalysis noted for insignificant growth   *Acute mechanical fall with generalized weakness Exacerbated by above Physical therapy recommend skilled nursing facility status post discharge  *Chronic diabetes mellitus Stable on current regiment  *Hypertension Stable on current regiment  *Hypothyroidism Continued Synthroid   *Acute elevated troponins  Most likely secondary to demand ischemia  Cardiac enzymes inconsistent with acute coronary syndrome  Conservative medical management recommended   Discharge to skilled nursing facility on discharge with outpatient follow-up primary care provider in 3 to 5 days for  reevaluation  DISCHARGE CONDITIONS:   stable  CONSULTS OBTAINED:  Treatment Team:  Bertrum Sol, MD  DRUG ALLERGIES:   Allergies  Allergen Reactions  . Statins Other (See Comments)    Other reaction(s): UNKNOWN    DISCHARGE MEDICATIONS:   Allergies as of 02/10/2019      Reactions   Statins Other (See Comments)   Other reaction(s): UNKNOWN      Medication List    TAKE these medications   acetaminophen 325 MG tablet Commonly known as:  TYLENOL Take 650 mg by mouth every 6 (six) hours as needed for mild pain.   albuterol 108 (90 Base) MCG/ACT inhaler Commonly known as:  VENTOLIN HFA Inhale 2 puffs into the lungs every 4 (four) hours as needed for wheezing or shortness of breath.   aspirin EC 81 MG tablet Take 81 mg by mouth daily.   bimatoprost 0.03 % ophthalmic solution Commonly known as:  LUMIGAN Place 1 drop into both eyes at bedtime.   brimonidine 0.15 % ophthalmic solution Commonly known as:  ALPHAGAN Place 1 drop into both eyes 3 (three) times daily.   cephALEXin 500 MG capsule Commonly known as:  KEFLEX Take 1 capsule (500 mg total) by mouth 4 (four) times daily for 10 days.   clobetasol 0.05 % Gel Commonly known as:  TEMOVATE Apply 1 application topically 2 (two) times daily. After bath, apply to bilateral lower extremity lichenified skin daily Dispense 1 month course   enalapril 20 MG tablet Commonly known as:  VASOTEC Take 20 mg by mouth daily.   levothyroxine 75 MCG tablet Commonly known as:  SYNTHROID Take 75 mcg by mouth daily before breakfast.   loperamide 2 MG capsule Commonly  known as:  IMODIUM Take 2 mg by mouth as needed for diarrhea or loose stools (max 8 capsules daily).   metFORMIN 500 MG 24 hr tablet Commonly known as:  GLUCOPHAGE-XR Take 500 mg by mouth daily.   multivitamin with minerals Tabs tablet Take 1 tablet by mouth daily. Start taking on:  Feb 11, 2019   Procto-Med HC 2.5 % rectal cream Generic drug:   hydrocortisone Place 1 application rectally every 4 (four) hours as needed for hemorrhoids or anal itching.   timolol 0.5 % ophthalmic solution Commonly known as:  BETIMOL Place 1 drop into both eyes 2 (two) times daily.        DISCHARGE INSTRUCTIONS:    If you experience worsening of your admission symptoms, develop shortness of breath, life threatening emergency, suicidal or homicidal thoughts you must seek medical attention immediately by calling 911 or calling your MD immediately  if symptoms less severe.  You Must read complete instructions/literature along with all the possible adverse reactions/side effects for all the Medicines you take and that have been prescribed to you. Take any new Medicines after you have completely understood and accept all the possible adverse reactions/side effects.   Please note  You were cared for by a hospitalist during your hospital stay. If you have any questions about your discharge medications or the care you received while you were in the hospital after you are discharged, you can call the unit and asked to speak with the hospitalist on call if the hospitalist that took care of you is not available. Once you are discharged, your primary care physician will handle any further medical issues. Please note that NO REFILLS for any discharge medications will be authorized once you are discharged, as it is imperative that you return to your primary care physician (or establish a relationship with a primary care physician if you do not have one) for your aftercare needs so that they can reassess your need for medications and monitor your lab values.    Today   CHIEF COMPLAINT:   Chief Complaint  Patient presents with  . Fall    HISTORY OF PRESENT ILLNESS:  78 y.o. female with a known history of asthma, diabetes mellitus, peripheral venous insufficiency, glaucoma, hypothyroidism.  Patient was brought to the emergency room via EMS services reporting  she slipped out of her chair onto the ground and was unable to get back up.  Patient lives alone.  She usually ambulates with assistance of a walker.  She denies hitting her head or loss of consciousness.  Patient is on no anticoagulant therapy.  She denies chest pain or shortness of breath increased.  She denies cough.  She denies recent illness.  She denies fevers, chills, nausea, vomiting, diarrhea.  She denies abdominal pain.  Patient has a history of bilateral lower extremity venous insufficiency and cellulitis with nonhealing wounds in the past according to documentation in 2016 patient was seeing Dr. Alberteen Spindleline.  She has noticed increased erythema and edema bilateral lower extremities with a draining wound on the medial aspect of her right lower extremity.  She denies significant pain of her lower extremities related to this.  Labs were reviewed with BNP of 28.  Opponent is 0.04.  WBC is 11.5.  Right hip CT demonstrated no evidence of fracture or dislocation.  Chest x-ray demonstrated no acute pulmonary disease.  Rapid COVID-19 testing is negative We have admitted her to the hospitalist service for further management.   VITAL SIGNS:  Blood  pressure (!) 143/77, pulse 73, temperature 98.9 F (37.2 C), temperature source Oral, resp. rate 16, height 5\' 11"  (1.803 m), weight 87.3 kg, SpO2 99 %.  I/O:    Intake/Output Summary (Last 24 hours) at 02/10/2019 1132 Last data filed at 02/10/2019 9357 Gross per 24 hour  Intake 102.07 ml  Output 1704 ml  Net -1601.93 ml    PHYSICAL EXAMINATION:  GENERAL:  78 y.o.-year-old patient lying in the bed with no acute distress.  EYES: Pupils equal, round, reactive to light and accommodation. No scleral icterus. Extraocular muscles intact.  HEENT: Head atraumatic, normocephalic. Oropharynx and nasopharynx clear.  NECK:  Supple, no jugular venous distention. No thyroid enlargement, no tenderness.  LUNGS: Normal breath sounds bilaterally, no wheezing,  rales,rhonchi or crepitation. No use of accessory muscles of respiration.  CARDIOVASCULAR: S1, S2 normal. No murmurs, rubs, or gallops.  ABDOMEN: Soft, non-tender, non-distended. Bowel sounds present. No organomegaly or mass.  EXTREMITIES: No pedal edema, cyanosis, or clubbing.  NEUROLOGIC: Cranial nerves II through XII are intact. Muscle strength 5/5 in all extremities. Sensation intact. Gait not checked.  PSYCHIATRIC: The patient is alert and oriented x 3.  SKIN: No obvious rash, lesion, or ulcer.   DATA REVIEW:   CBC Recent Labs  Lab 02/08/19 0514  WBC 10.2  HGB 10.9*  HCT 35.8*  PLT 288    Chemistries  Recent Labs  Lab 02/08/19 0725  NA 134*  K 4.4  CL 100  CO2 23  GLUCOSE 155*  BUN 10  CREATININE 0.60  CALCIUM 8.4*  AST 19  ALT 11  ALKPHOS 72  BILITOT 1.1    Cardiac Enzymes Recent Labs  Lab 02/08/19 1319  TROPONINI <0.03    Microbiology Results  Results for orders placed or performed during the hospital encounter of 02/07/19  SARS Coronavirus 2 (CEPHEID - Performed in [facility] hospital lab), Hosp Order     Status: None   Collection Time: 02/08/19  1:17 AM  Result Value Ref Range Status   SARS Coronavirus 2 NEGATIVE NEGATIVE Final    Comment: (NOTE) If result is NEGATIVE SARS-CoV-2 target nucleic acids are NOT DETECTED. The SARS-CoV-2 RNA is generally detectable in upper and lower  respiratory specimens during the acute phase of infection. The lowest  concentration of SARS-CoV-2 viral copies this assay can detect is 250  copies / mL. A negative result does not preclude SARS-CoV-2 infection  and should not be used as the sole basis for treatment or other  patient management decisions.  A negative result may occur with  improper specimen collection / handling, submission of specimen other  than nasopharyngeal swab, presence of viral mutation(s) within the  areas targeted by this assay, and inadequate number of viral copies  (<250 copies / mL). A  negative result must be combined with clinical  observations, patient history, and epidemiological information. If result is POSITIVE SARS-CoV-2 target nucleic acids are DETECTED. The SARS-CoV-2 RNA is generally detectable in upper and lower  respiratory specimens dur ing the acute phase of infection.  Positive  results are indicative of active infection with SARS-CoV-2.  Clinical  correlation with patient history and other diagnostic information is  necessary to determine patient infection status.  Positive results do  not rule out bacterial infection or co-infection with other viruses. If result is PRESUMPTIVE POSTIVE SARS-CoV-2 nucleic acids MAY BE PRESENT.   A presumptive positive result was obtained on the submitted specimen  and confirmed on repeat testing.  While 2019 novel coronavirus  (  SARS-CoV-2) nucleic acids may be present in the submitted sample  additional confirmatory testing may be necessary for epidemiological  and / or clinical management purposes  to differentiate between  SARS-CoV-2 and other Sarbecovirus currently known to infect humans.  If clinically indicated additional testing with an alternate test  methodology [phone]) is advised. The SARS-CoV-2 RNA is generally  detectable in upper and lower respiratory sp ecimens during the acute  phase of infection. The expected result is Negative. Fact Sheet for Patients:  BoilerBrush.com.cy Fact Sheet for Healthcare Providers: https://pope.com/ This test is not yet approved or cleared by the [place] FDA and has been authorized for detection and/or diagnosis of SARS-CoV-2 by FDA under an Emergency Use Authorization (EUA).  This EUA will remain in effect (meaning this test can be used) for the duration of the COVID-19 declaration under Section 564(b)(1) of the Act, 21 U.S.C. section 360bbb-3(b)(1), unless the authorization is terminated or revoked sooner. Performed  at [facility], [address] [place], [place] [address]   Culture, blood (routine x 2)     Status: None (Preliminary result)   Collection Time: 02/08/19  5:15 AM  Result Value Ref Range Status   Specimen Description BLOOD LEFT ANTECUBITAL  Final   Special Requests   Final    BOTTLES DRAWN AEROBIC AND ANAEROBIC Blood Culture adequate volume   Culture   Final    NO GROWTH 2 DAYS Performed at [facility], [address] [place], [place] [address]    Report Status PENDING  Incomplete  Culture, blood (routine x 2)     Status: None (Preliminary result)   Collection Time: 02/08/19  5:20 AM  Result Value Ref Range Status   Specimen Description BLOOD RIGHT ANTECUBITAL  Final   Special Requests   Final    BOTTLES DRAWN AEROBIC AND ANAEROBIC Blood Culture adequate volume   Culture   Final    NO GROWTH 2 DAYS Performed at [facility], [address] [place], [place] [address]    Report Status PENDING  Incomplete  Urine Culture     Status: Abnormal   Collection Time: 02/08/19  6:51 AM  Result Value Ref Range Status   Specimen Description   Final    URINE, CLEAN CATCH Performed at [facility], [address] [place], [place] [address]    Special Requests   Final    NONE Performed at [facility], [address] [place], [place] [address]    Culture (A)  Final    <10,000 COLONIES/mL INSIGNIFICANT GROWTH Performed at [facility], [address], [place], [place] [address]    Report Status 02/09/2019 FINAL  Final    RADIOLOGY:  No results found.  EKG:   Orders placed or performed during the hospital encounter of 02/07/19  . ED EKG  . ED EKG  . EKG 12-Lead  . EKG 12-Lead  . EKG 12-Lead  . EKG 12-Lead      Management plans discussed with the patient, family and they are in agreement.  CODE STATUS:     Code Status Orders  (From admission, onward)         Start     Ordered   02/08/19 0622  Limited  resuscitation (code)  Continuous    Question Answer Comment  In the event of cardiac or respiratory ARREST: Initiate Code Blue, Call Rapid Response Yes   In the event of cardiac or respiratory ARREST: Perform CPR Yes  In the event of cardiac or respiratory ARREST: Perform Intubation/Mechanical Ventilation No   In the event of cardiac or respiratory ARREST: Use NIPPV/BiPAp only if indicated Yes   In the event of cardiac or respiratory ARREST: Administer ACLS medications if indicated Yes   In the event of cardiac or respiratory ARREST: Perform Defibrillation or Cardioversion if indicated Yes      02/08/19 0622        Code Status History    Date Active Date Inactive Code Status Order ID Comments User Context   02/08/2019 0332 02/08/2019 0622 Full Code [id number]  Pearletha Alfred, NP ED   12/10/2018 0932 12/14/2018 1857 Full Code [id number]  Jama Flavors, MD ED   03/13/2017 1351 03/15/2017 1815 DNR [id number]  Ulice Bold, NP Inpatient   03/13/2017 0726 03/13/2017 1351 Full Code [id number]  Arnaldo Natal, MD Inpatient      TOTAL TIME TAKING CARE OF THIS PATIENT: 35 minutes.    Evelena Asa Zadok Holaway M.D on 02/10/2019 at 11:32 AM  Between 7am to 6pm - Pager - [phone]  After 6pm go to [email or web address] - password [email or web address]  [facility]  Office  [phone]  CC: Primary care physician; Patient, No Pcp Per   Note: This dictation was prepared with Dragon dictation along with smaller phrase technology. Any transcriptional errors that result from this process are unintentional.

## 2019-02-10 NOTE — Progress Notes (Signed)
Physical Therapy Treatment Patient Details Name: Alben SpittleMelissa Pete MRN: [id number] DOB: 28-May-1941 Today's Date: 02/10/2019    History of Present Illness Pt is a 78 y.o. female presenting to hospital 02/07/19 with mechanical fall (slipped out of chair) and c/o R knee pain (able to bear weight).  Imaging of R hip and R knee negative for acute fx.  Pt admitted with B LE cellulitis, generalized weakness, DM, and htn.  PMH includes asthma, DM, htn, R shoulder pain, osteomyelitis, diabetic R toe ulcer, wound R posterior leg.    PT Comments    Patient progressing slowly. She refused out of bed mobility this session stating, "Im comfortable in my bed and I don't want to get up." this could be related to the fact that patient is supposed to be discharged to SNF rehab today. Patient was agreeable to LE exercise in bed. She did require increased cues for correct exercise technique. She exhibited increased strength on LLE as compared to RLE requiring less assistance for left leg movement. Patient would benefit from additional skilled PT Intervention to improve strength, balance and mobility; She was left in bed with bed alarm on, needs in reach;     Follow Up Recommendations  SNF     Equipment Recommendations  Wheelchair (measurements PT);Wheelchair cushion (measurements PT)    Recommendations for Other Services OT consult     Precautions / Restrictions Precautions Precautions: Fall Precaution Comments: Wound posterior R leg Restrictions Weight Bearing Restrictions: No    Mobility  Bed Mobility               General bed mobility comments: pt refused out of bed mobility this session; unable to assess bed mobility level;   Transfers                 General transfer comment: pt refused out of bed mobility;   Ambulation/Gait                 Stairs             Wheelchair Mobility    Modified Rankin (Stroke Patients Only)       Balance                                             Cognition Arousal/Alertness: Awake/alert Behavior During Therapy: WFL for tasks assessed/performed Overall Cognitive Status: Within Functional Limits for tasks assessed                                 General Comments: Oriented to person, place, time, and situation.  Increased time to problem solve and verbalize at times noted.      Exercises General Exercises - Lower Extremity Short Arc Quad: AROM;Both;10 reps;Supine Heel Slides: AAROM;Both;10 reps;Supine Hip ABduction/ADduction: AROM;Both;10 reps;Supine Straight Leg Raises: AROM;AAROM;Right;Left;10 reps;Supine Toe Raises: AROM;10 reps;Supine;Both Heel Raises: AROM;10 reps;Supine;Both Other Exercises Other Exercises: Patient refused out of bed mobility; PT instructed patient in LE strengthening/ROM exercise. patient required min VCs to increase AROM and slow down LE movement for better strengthening. She did require AAROM on RLE for better ROM particularly with SLR and heel slides but was able to exhibit improved ROM on LLE indicating increased strength;  Other Exercises: pt fatigues quickly;     General Comments        Pertinent Vitals/Pain  Pain Assessment: No/denies pain    Home Living                      Prior Function            PT Goals (current goals can now be found in the care plan section) Acute Rehab PT Goals Patient Stated Goal: to go home PT Goal Formulation: With patient Time For Goal Achievement: 02/22/19 Potential to Achieve Goals: Fair Progress towards PT goals: Not progressing toward goals - comment(pt refused out of bed mobility this session limiting progress and mobility)    Frequency    Min 2X/week      PT Plan Current plan remains appropriate    Co-evaluation              AM-PAC PT "6 Clicks" Mobility   Outcome Measure  Help needed turning from your back to your side while in a flat bed without using bedrails?: A  Little Help needed moving from lying on your back to sitting on the side of a flat bed without using bedrails?: A Lot Help needed moving to and from a bed to a chair (including a wheelchair)?: Total Help needed standing up from a chair using your arms (e.g., wheelchair or bedside chair)?: Total Help needed to walk in hospital room?: Total Help needed climbing 3-5 steps with a railing? : Total 6 Click Score: 9    End of Session Equipment Utilized During Treatment: Gait belt Activity Tolerance: Patient limited by fatigue Patient left: in bed;with call bell/phone within reach;with bed alarm set;Other (comment)(bed in lowest position) Nurse Communication: Mobility status;Precautions PT Visit Diagnosis: Other abnormalities of gait and mobility (R26.89);Muscle weakness (generalized) (M62.81);History of falling (Z91.81);Difficulty in walking, not elsewhere classified (R26.2);Pain Pain - Right/Left: Right Pain - part of body: Leg     Time: 4098-1191 PT Time Calculation (min) (ACUTE ONLY): 21 min  Charges:  $Therapeutic Exercise: 8-22 mins                        Taccara Bushnell PT, DPT 02/10/2019, 12:05 PM

## 2019-02-13 LAB — CULTURE, BLOOD (ROUTINE X 2)
Culture: NO GROWTH
Culture: NO GROWTH
Special Requests: ADEQUATE
Special Requests: ADEQUATE

## 2020-03-08 IMAGING — CR DG HIP (WITH OR WITHOUT PELVIS) 2-3V RIGHT
1 series · 3 of 3 positions shown · non-contrast
Comparison: None.

CLINICAL DATA: Right hip pain.

EXAM:
DG HIP (WITH OR WITHOUT PELVIS) 2-3V RIGHT

[Series 1: dg hip unilat w or w/o pelvis 2-3 views  · non-contrast · 0.14mm/px · 3 of 3 slices shown]
[im 1/3]
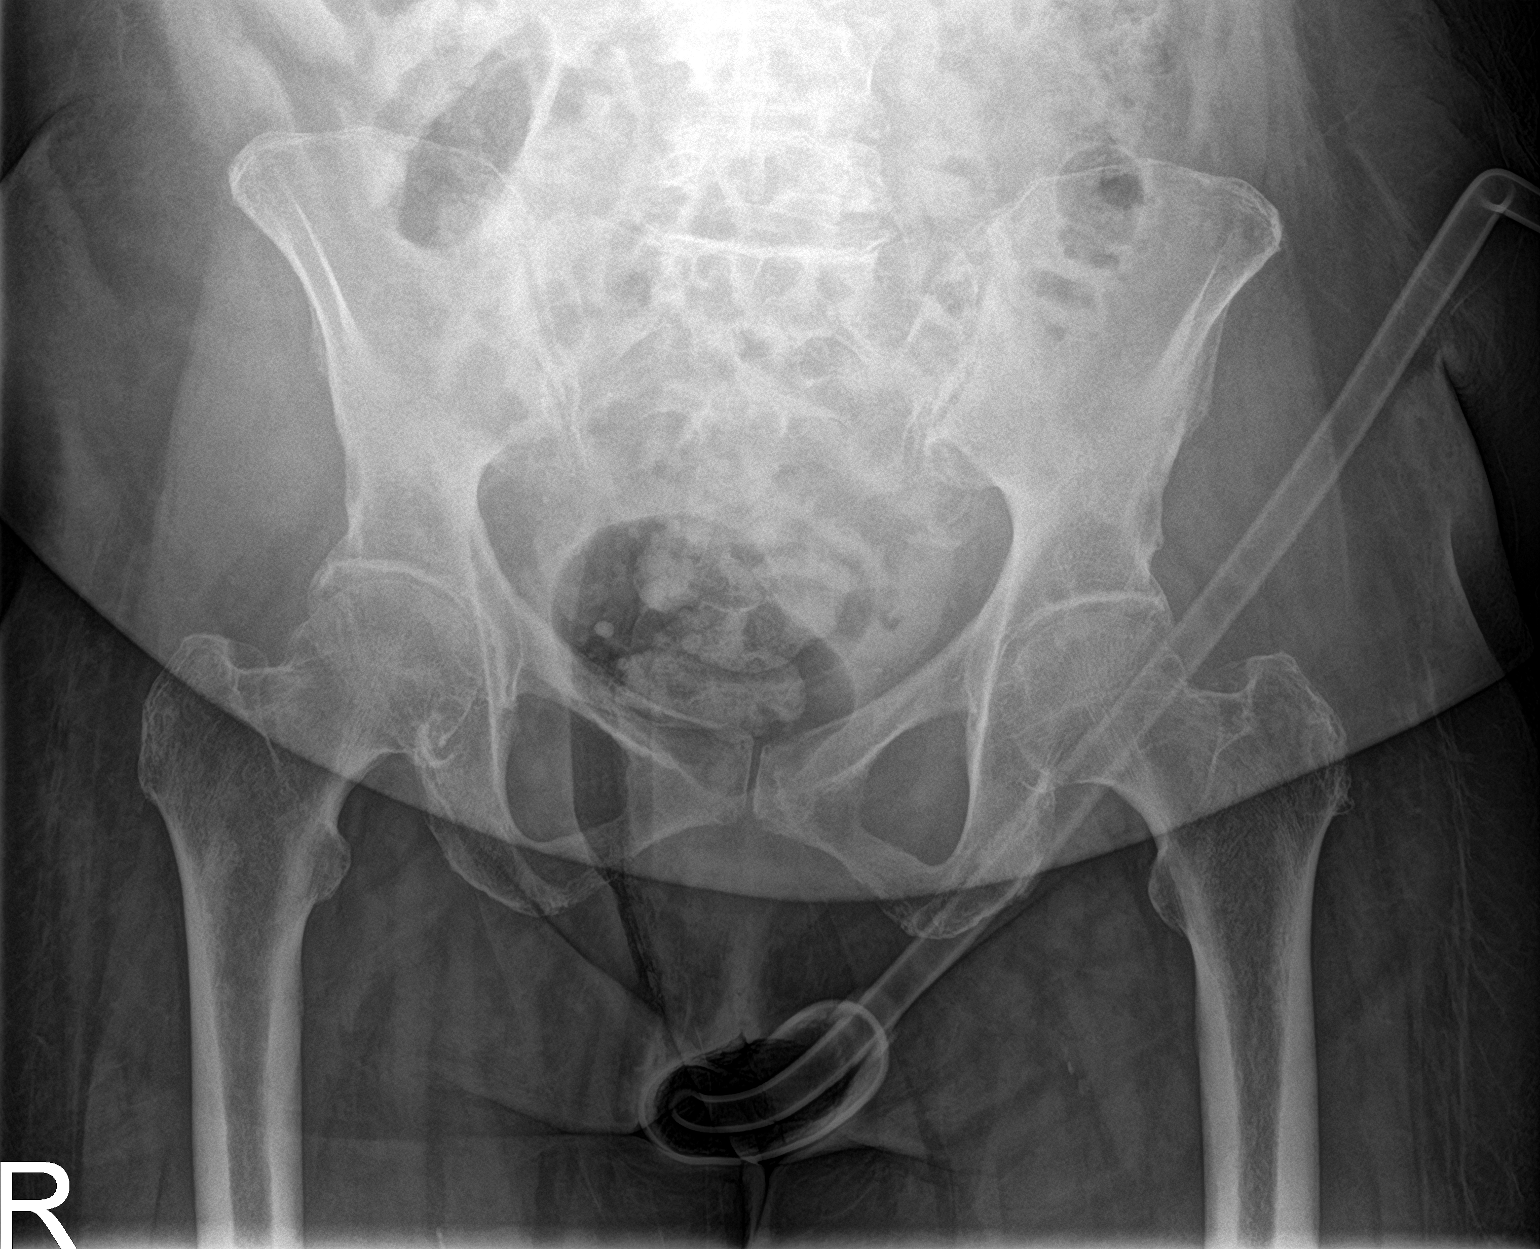
[im 2/3]
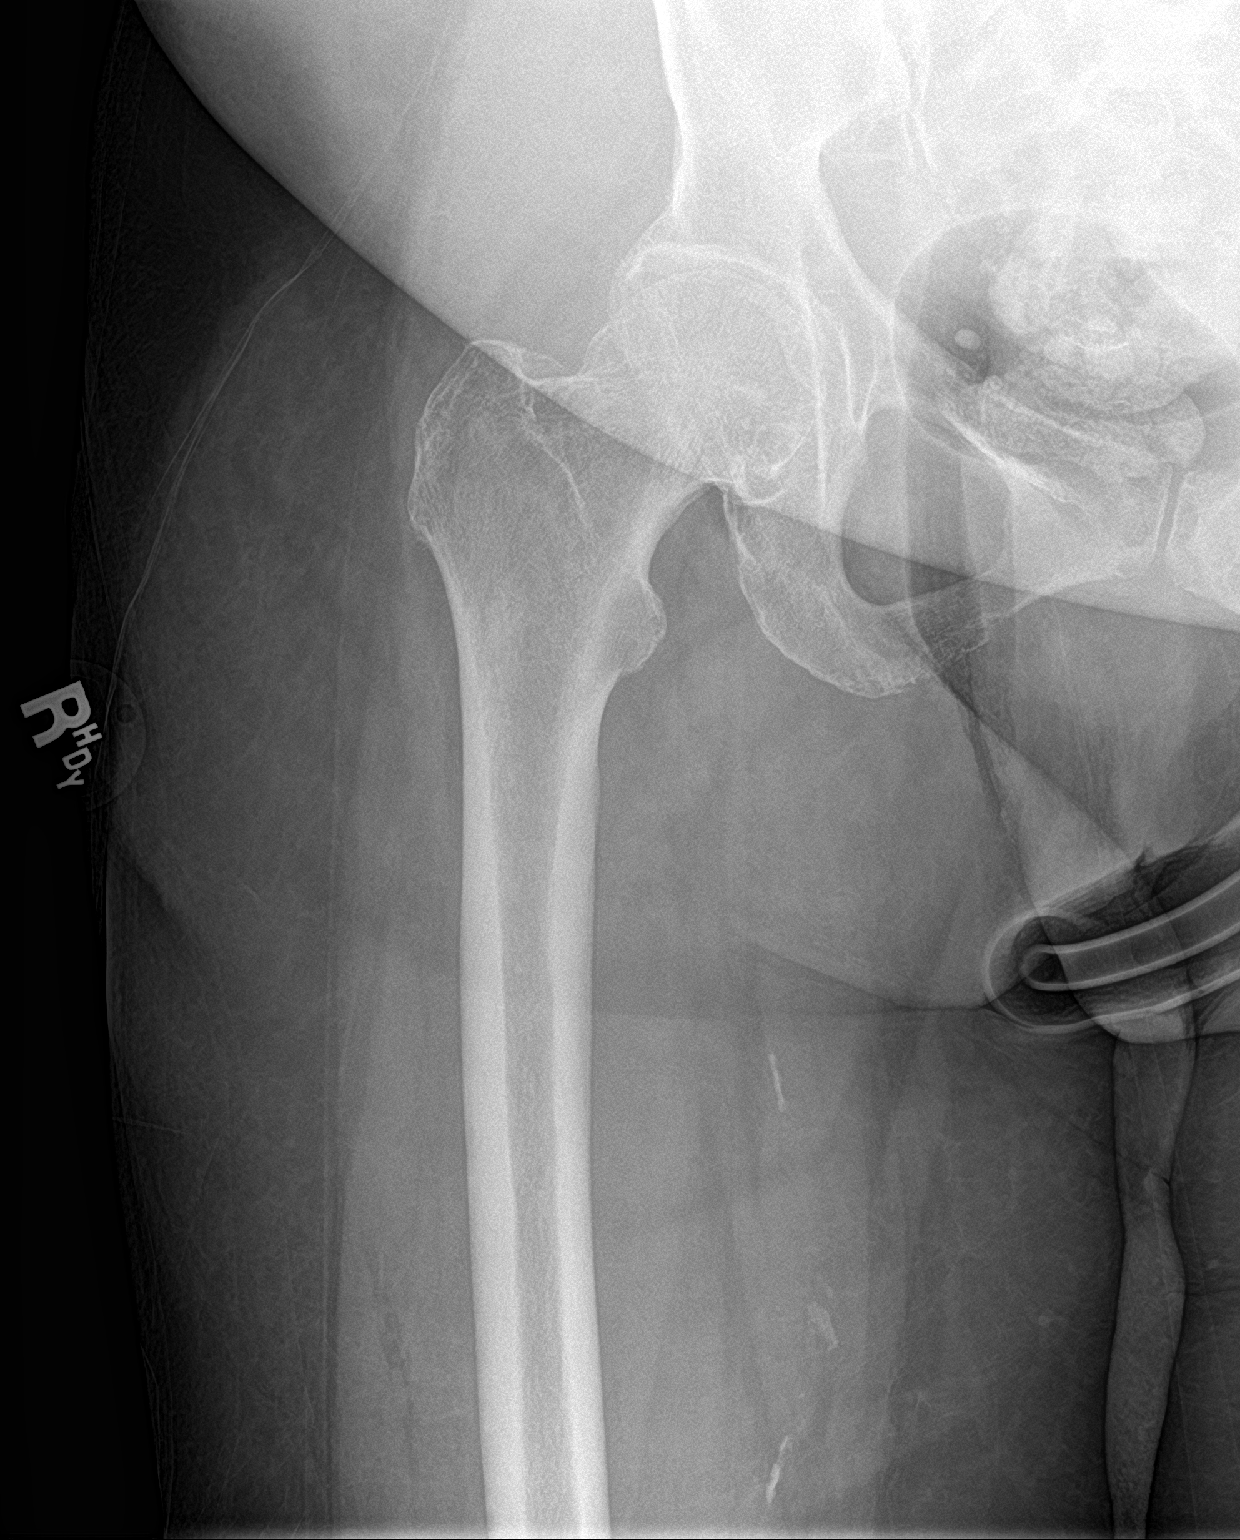
[im 3/3]
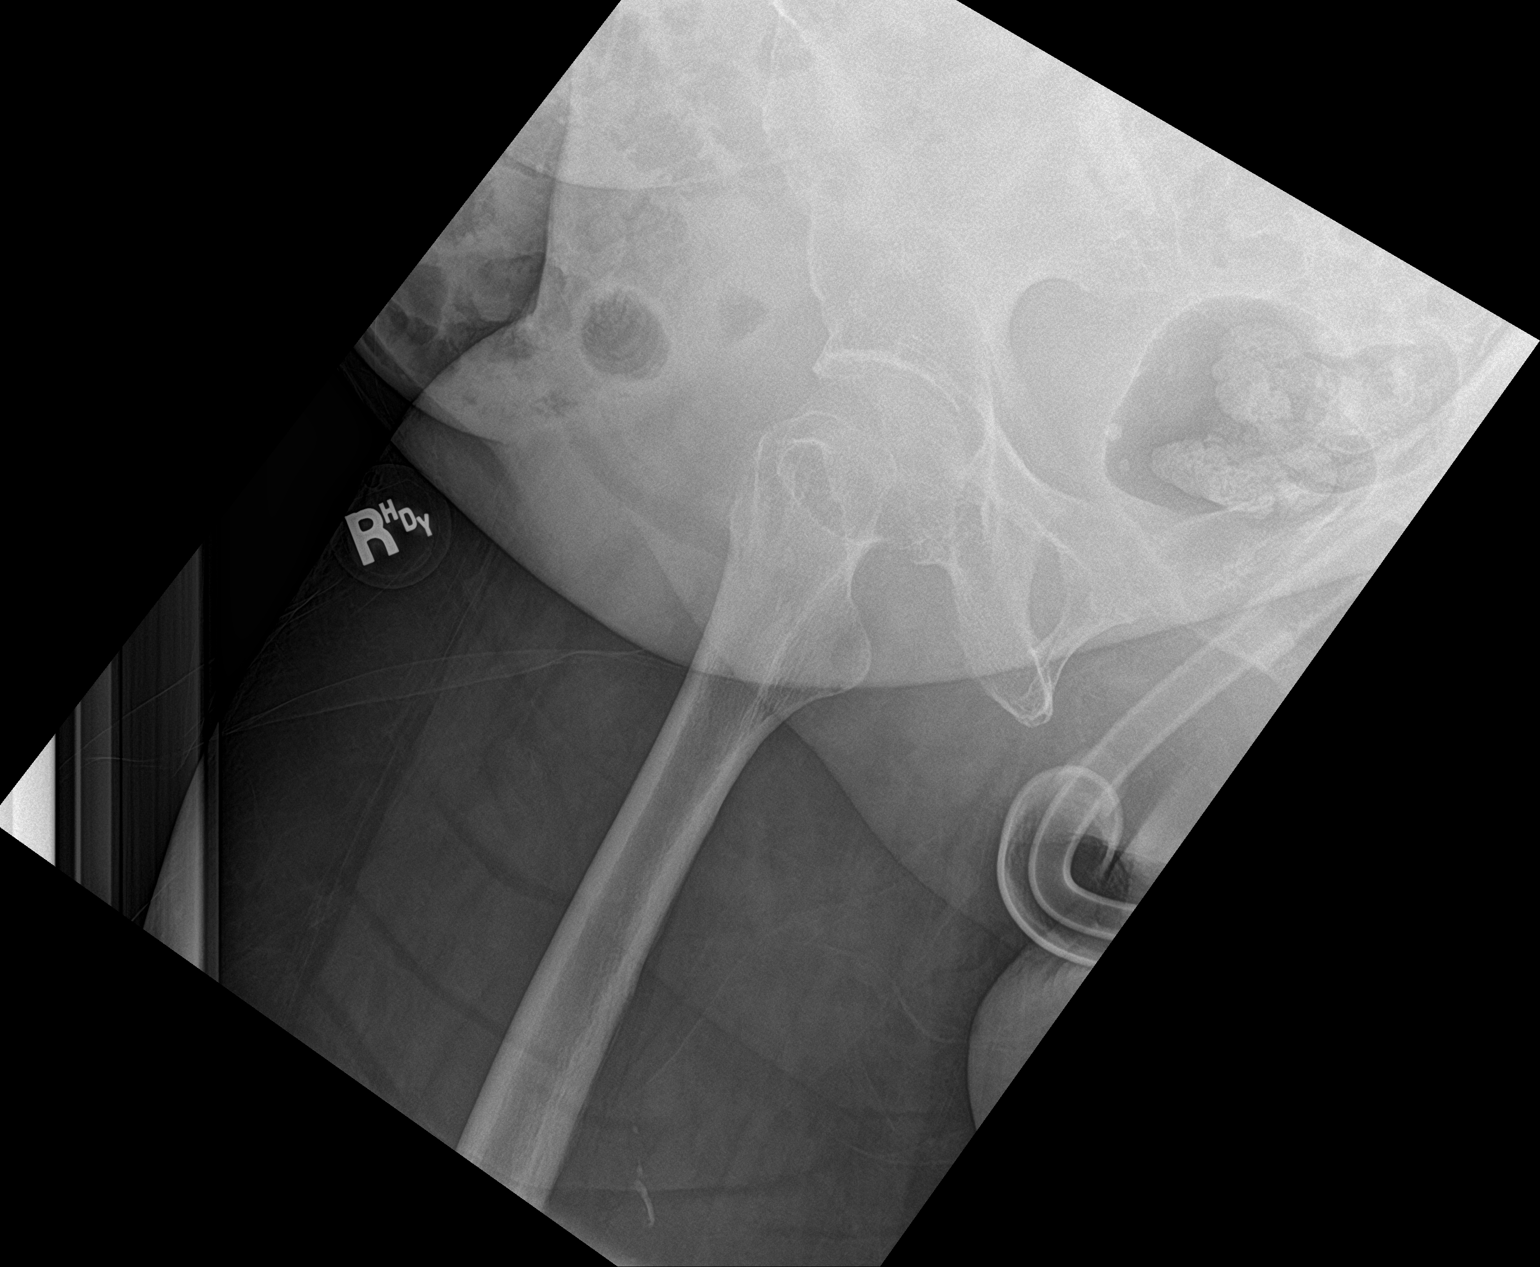

[3 of 3 positions shown; findings below may reference images not displayed]

FINDINGS: There is no evidence of hip fracture or dislocation. There is no
evidence of arthropathy or other focal bone abnormality.
IMPRESSION: Negative.

## 2020-05-05 IMAGING — CR CHEST  1 VIEW
1 series · 1 of 1 positions shown · non-contrast
Comparison: 01/28/2011

CLINICAL DATA: Pain status post fall

EXAM:
CHEST  1 VIEW

[chest ap]
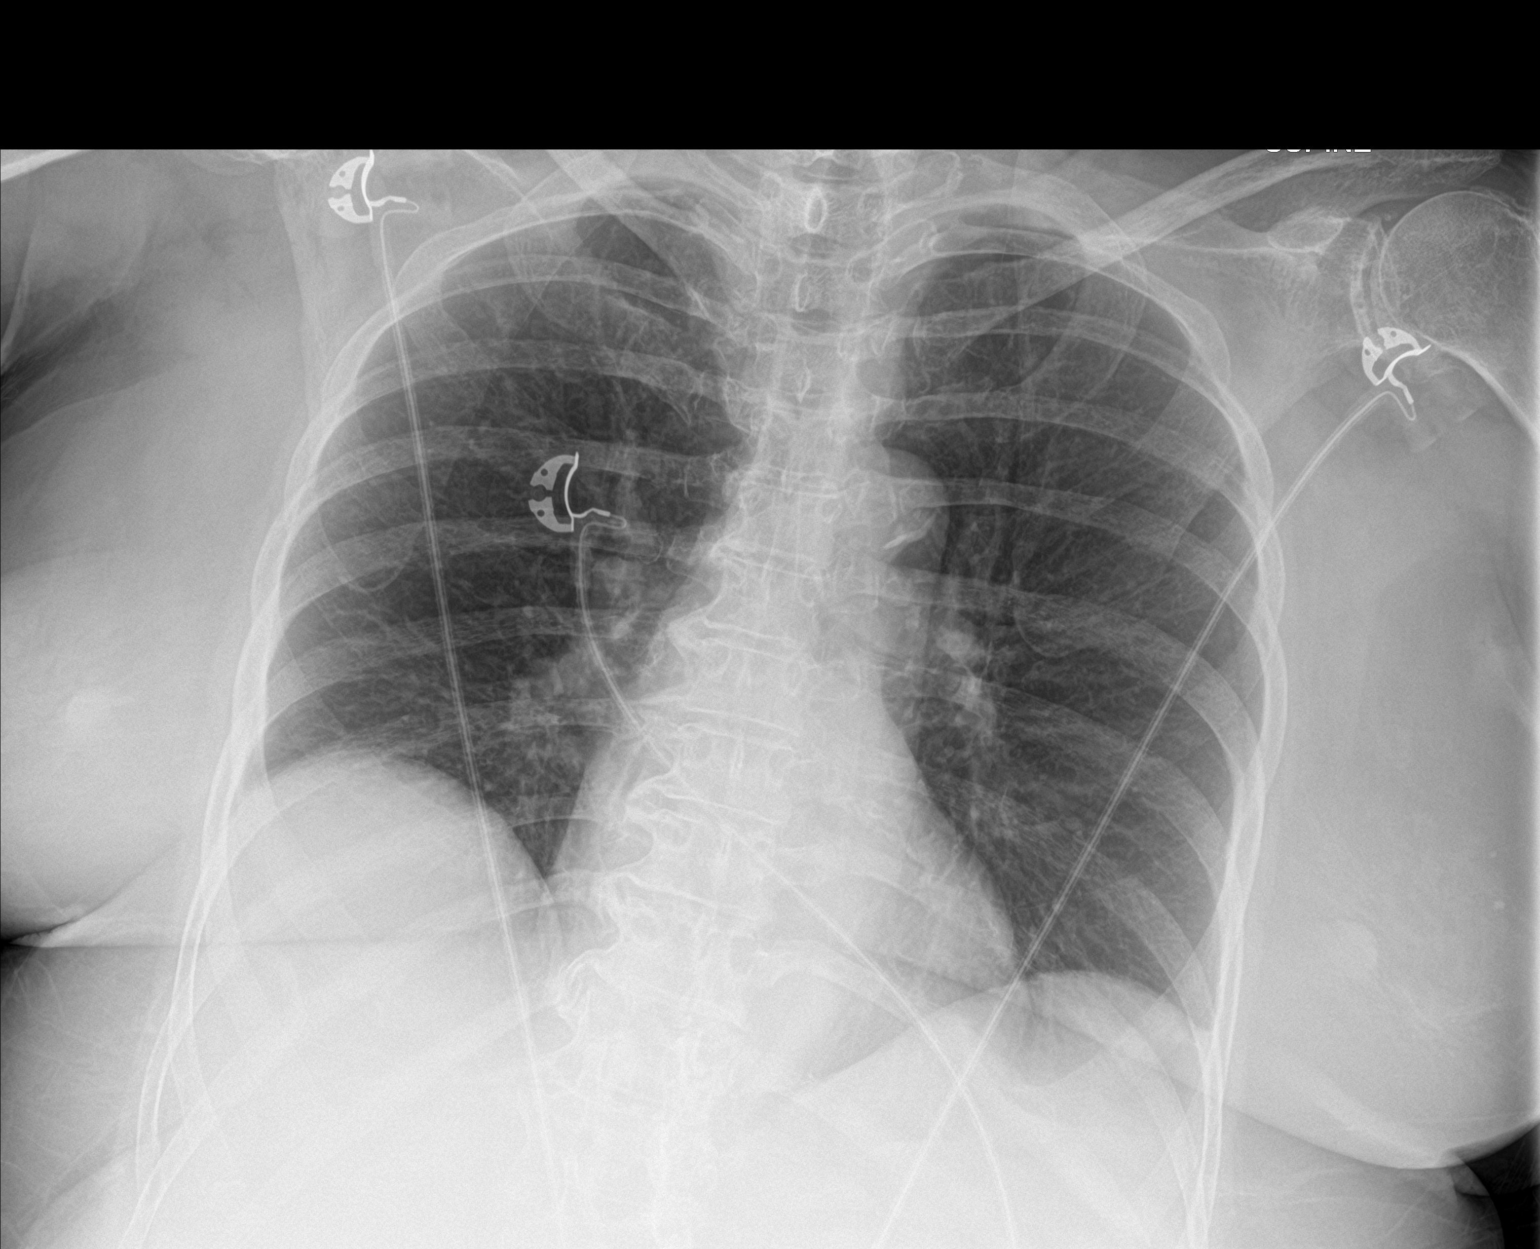

[1 of 1 positions shown; findings below may reference images not displayed]

FINDINGS: The heart size and mediastinal contours are within normal limits.
Both lungs are clear. The visualized skeletal structures are
unremarkable.
IMPRESSION: No active disease.

## 2020-05-05 IMAGING — CR RIGHT KNEE - COMPLETE 4+ VIEW
4 series · 4 of 4 positions shown · non-contrast
Comparison: None.

CLINICAL DATA: Pain status post fall

EXAM:
RIGHT KNEE - COMPLETE 4+ VIEW

[knee ap]
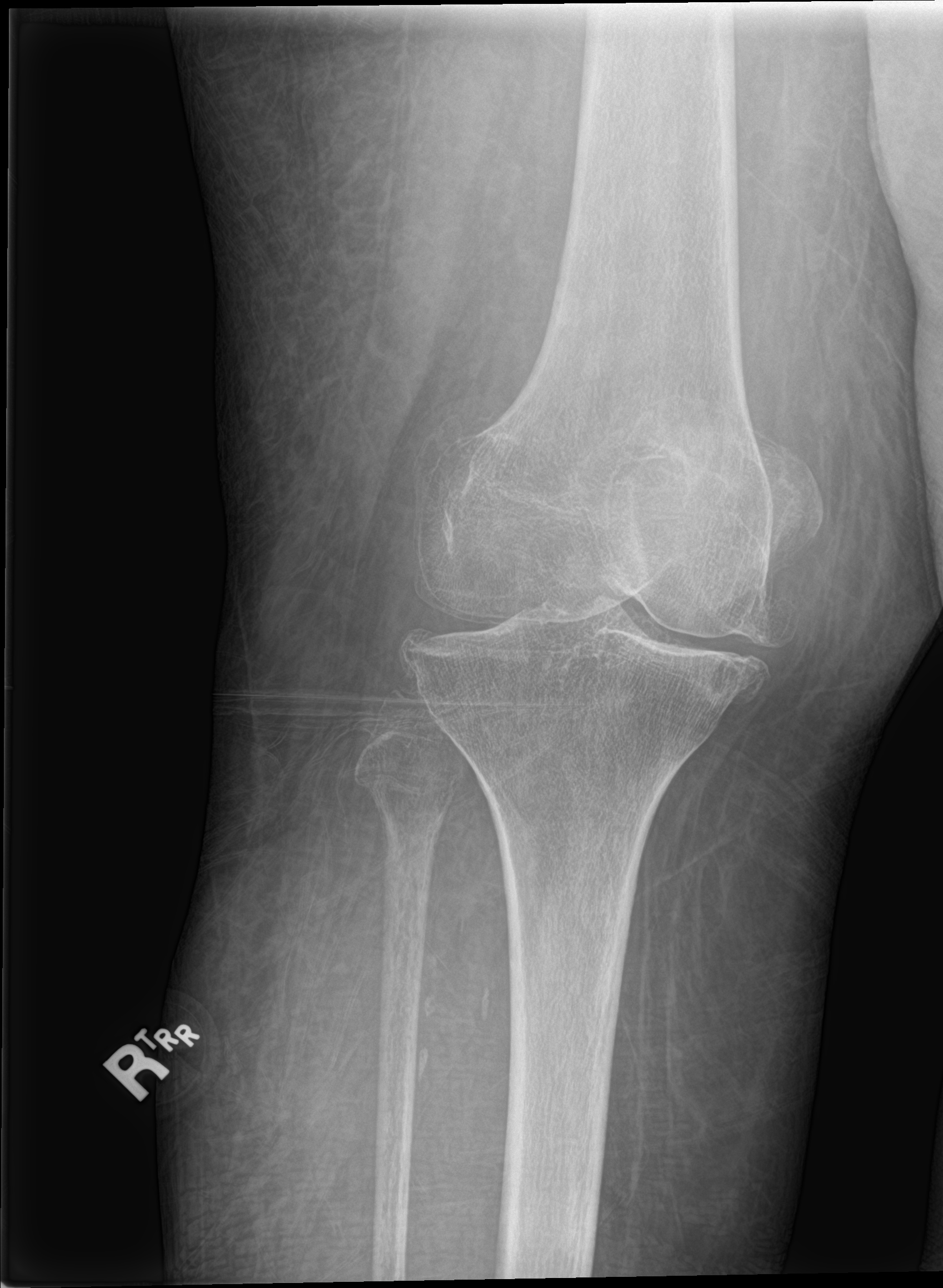

[knee obl (1 of 2)]
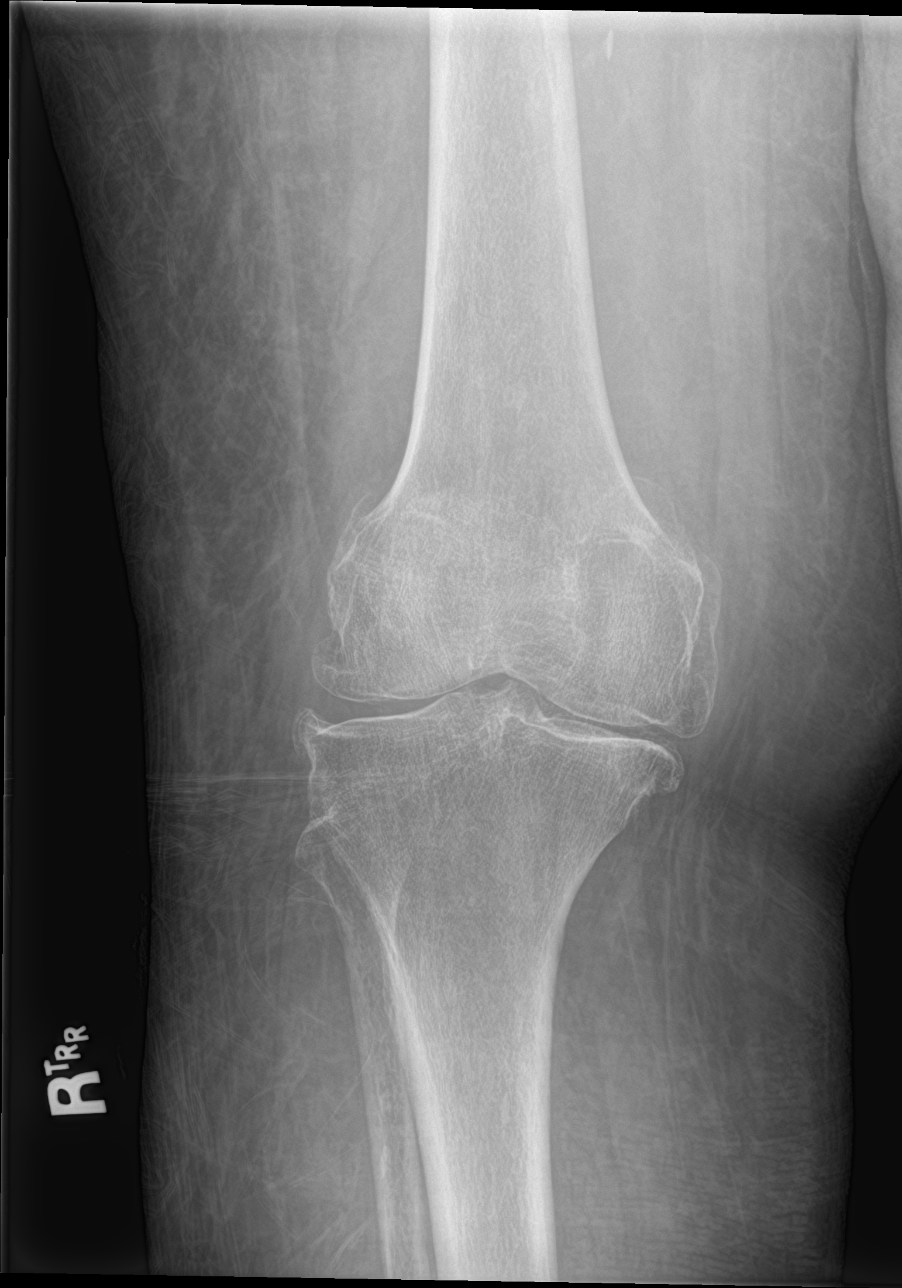

[knee obl (2 of 2)]
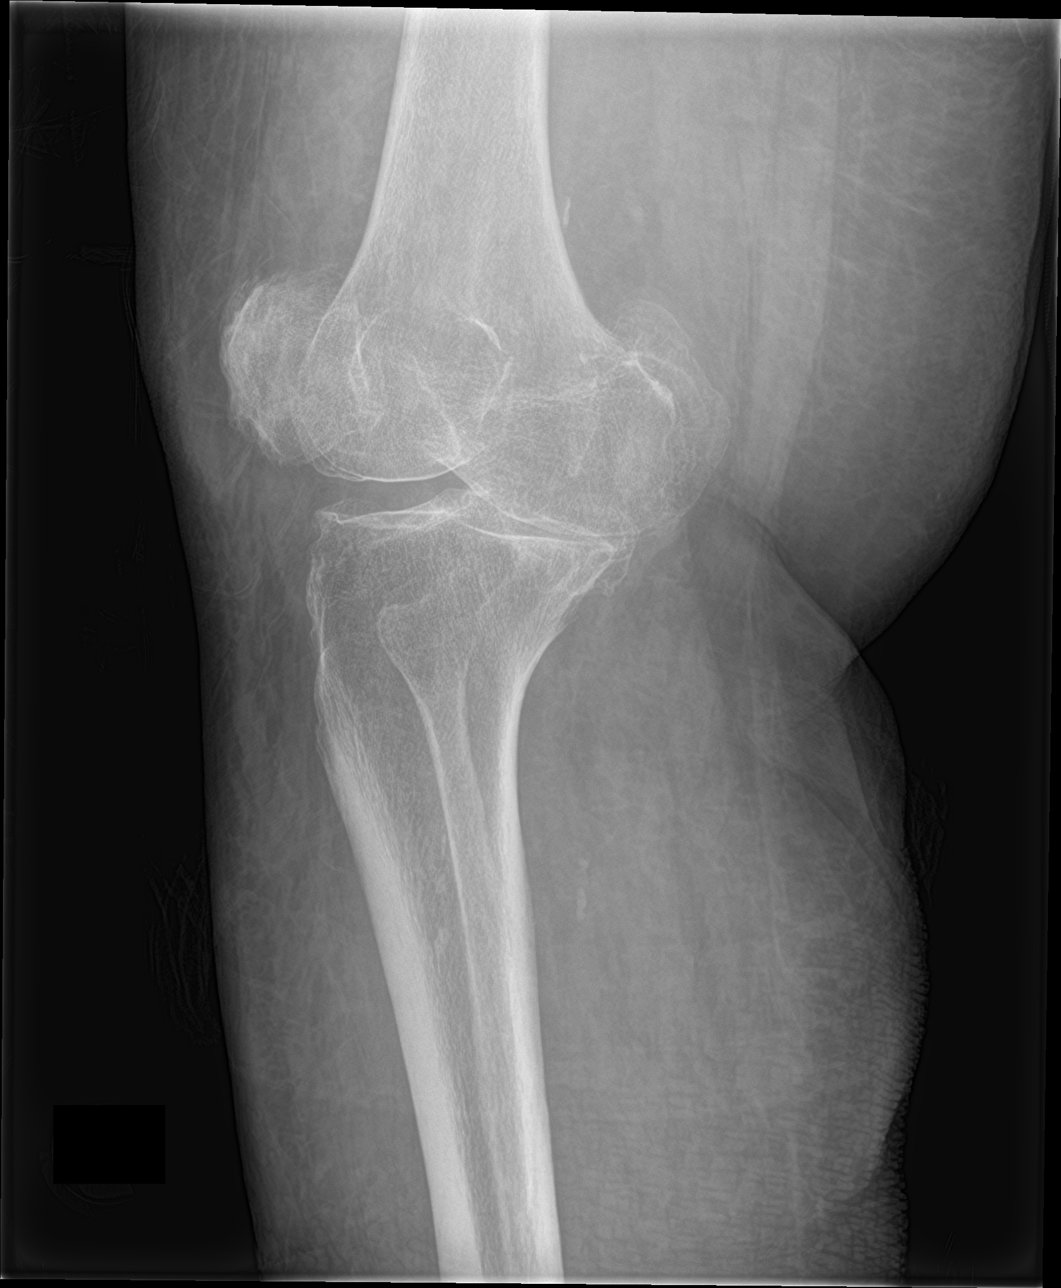

[knee lat]
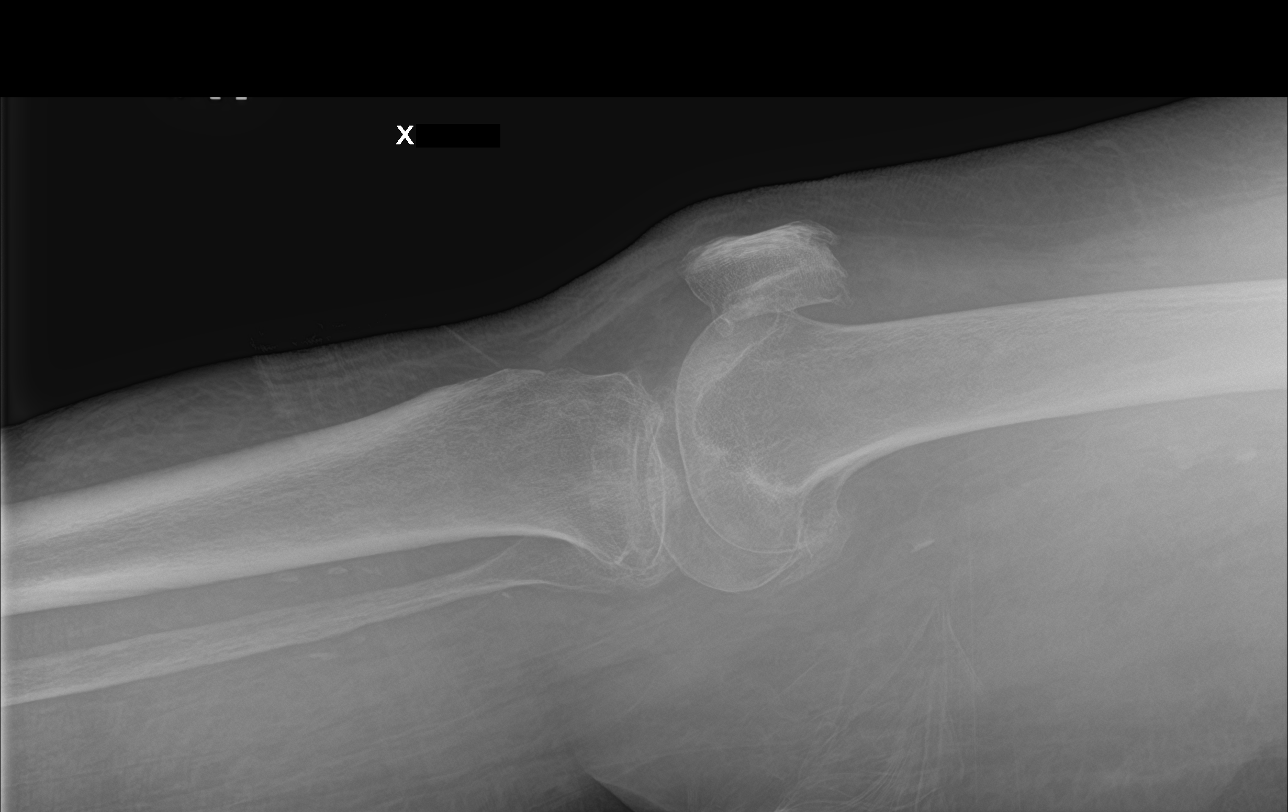

[4 of 4 positions shown; findings below may reference images not displayed]

FINDINGS: There are end-stage degenerative changes of the right knee. There is
no displaced fracture or dislocation. No significant joint effusion.
There is diffuse osteopenia which limits detection of nondisplaced
fractures.
IMPRESSION: 1. No acute displaced fracture or dislocation.
2. End-stage degenerative changes of the knee.

## 2024-05-27 ENCOUNTER — Ambulatory Visit: Admitting: Surgery
